# Patient Record
Sex: Female | Born: 1935 | Race: Black or African American | Hispanic: No | Smoking: Former smoker
Health system: Southern US, Community
[De-identification: ages and names within clinical notes are randomized; demographics above are authoritative.]

## PROBLEM LIST (undated history)

## (undated) DIAGNOSIS — J4489 Other specified chronic obstructive pulmonary disease: Secondary | ICD-10-CM

## (undated) DIAGNOSIS — I69959 Hemiplegia and hemiparesis following unspecified cerebrovascular disease affecting unspecified side: Secondary | ICD-10-CM

## (undated) DIAGNOSIS — R918 Other nonspecific abnormal finding of lung field: Secondary | ICD-10-CM

## (undated) DIAGNOSIS — K5904 Chronic idiopathic constipation: Secondary | ICD-10-CM

## (undated) DIAGNOSIS — F0280 Dementia in other diseases classified elsewhere without behavioral disturbance: Secondary | ICD-10-CM

## (undated) DIAGNOSIS — F411 Generalized anxiety disorder: Secondary | ICD-10-CM

## (undated) DIAGNOSIS — R350 Frequency of micturition: Secondary | ICD-10-CM

## (undated) DIAGNOSIS — F0390 Unspecified dementia without behavioral disturbance: Secondary | ICD-10-CM

## (undated) DIAGNOSIS — N182 Chronic kidney disease, stage 2 (mild): Secondary | ICD-10-CM

## (undated) DIAGNOSIS — G309 Alzheimer's disease, unspecified: Secondary | ICD-10-CM

## (undated) DIAGNOSIS — R5381 Other malaise: Secondary | ICD-10-CM

## (undated) DIAGNOSIS — F0150 Vascular dementia without behavioral disturbance: Secondary | ICD-10-CM

## (undated) DIAGNOSIS — C3490 Malignant neoplasm of unspecified part of unspecified bronchus or lung: Secondary | ICD-10-CM

## (undated) DIAGNOSIS — E46 Unspecified protein-calorie malnutrition: Secondary | ICD-10-CM

## (undated) DIAGNOSIS — R413 Other amnesia: Secondary | ICD-10-CM

## (undated) DIAGNOSIS — F05 Delirium due to known physiological condition: Secondary | ICD-10-CM

## (undated) DIAGNOSIS — M1990 Unspecified osteoarthritis, unspecified site: Secondary | ICD-10-CM

## (undated) DIAGNOSIS — J449 Chronic obstructive pulmonary disease, unspecified: Secondary | ICD-10-CM

## (undated) DIAGNOSIS — L89159 Pressure ulcer of sacral region, unspecified stage: Secondary | ICD-10-CM

## (undated) DIAGNOSIS — E785 Hyperlipidemia, unspecified: Secondary | ICD-10-CM

## (undated) DIAGNOSIS — R5383 Other fatigue: Secondary | ICD-10-CM

## (undated) DIAGNOSIS — F0392 Unspecified dementia, unspecified severity, with psychotic disturbance: Secondary | ICD-10-CM

## (undated) HISTORY — DX: Other fatigue: R53.83

## (undated) HISTORY — DX: Other amnesia: R41.3

## (undated) HISTORY — DX: Unspecified dementia, unspecified severity, without behavioral disturbance, psychotic disturbance, mood disturbance, and anxiety: F03.90

## (undated) HISTORY — DX: Unspecified dementia, unspecified severity, with psychotic disturbance: F03.92

## (undated) HISTORY — PX: CATARACT EXTRACTION, BILATERAL: SHX1313

## (undated) HISTORY — DX: Chronic obstructive pulmonary disease, unspecified: J44.9

## (undated) HISTORY — DX: Other malaise: R53.81

## (undated) HISTORY — DX: Other nonspecific abnormal finding of lung field: R91.8

## (undated) HISTORY — DX: Hyperlipidemia, unspecified: E78.5

## (undated) HISTORY — DX: Hemiplegia and hemiparesis following unspecified cerebrovascular disease affecting unspecified side: I69.959

## (undated) HISTORY — DX: Unspecified dementia, unspecified severity, without behavioral disturbance, psychotic disturbance, mood disturbance, and anxiety: F05

## (undated) HISTORY — DX: Frequency of micturition: R35.0

## (undated) HISTORY — DX: Generalized anxiety disorder: F41.1

## (undated) HISTORY — DX: Other specified chronic obstructive pulmonary disease: J44.89

---

## 1997-07-02 ENCOUNTER — Other Ambulatory Visit: Admission: RE | Admit: 1997-07-02 | Discharge: 1997-07-02 | Payer: Self-pay | Admitting: Nephrology

## 2000-06-13 ENCOUNTER — Encounter: Payer: Self-pay | Admitting: Nephrology

## 2000-06-13 ENCOUNTER — Encounter: Admission: RE | Admit: 2000-06-13 | Discharge: 2000-06-13 | Payer: Self-pay | Admitting: Nephrology

## 2002-08-06 ENCOUNTER — Other Ambulatory Visit: Admission: RE | Admit: 2002-08-06 | Discharge: 2002-08-06 | Payer: Self-pay | Admitting: Nephrology

## 2002-08-06 ENCOUNTER — Other Ambulatory Visit: Admission: RE | Admit: 2002-08-06 | Discharge: 2002-08-06 | Payer: Self-pay | Admitting: Cardiology

## 2005-08-27 ENCOUNTER — Other Ambulatory Visit: Admission: RE | Admit: 2005-08-27 | Discharge: 2005-08-27 | Payer: Self-pay | Admitting: Nephrology

## 2006-09-18 ENCOUNTER — Encounter: Admission: RE | Admit: 2006-09-18 | Discharge: 2006-09-18 | Payer: Self-pay | Admitting: Nephrology

## 2007-10-15 ENCOUNTER — Ambulatory Visit: Payer: Self-pay | Admitting: Internal Medicine

## 2007-10-20 ENCOUNTER — Encounter: Admission: RE | Admit: 2007-10-20 | Discharge: 2007-10-20 | Payer: Self-pay | Admitting: Nephrology

## 2007-10-30 ENCOUNTER — Encounter: Payer: Self-pay | Admitting: Internal Medicine

## 2007-10-30 ENCOUNTER — Ambulatory Visit: Payer: Self-pay | Admitting: Internal Medicine

## 2007-11-01 ENCOUNTER — Encounter: Payer: Self-pay | Admitting: Internal Medicine

## 2008-05-07 ENCOUNTER — Ambulatory Visit (HOSPITAL_COMMUNITY): Admission: RE | Admit: 2008-05-07 | Discharge: 2008-05-07 | Payer: Self-pay | Admitting: Ophthalmology

## 2009-07-04 ENCOUNTER — Ambulatory Visit (HOSPITAL_COMMUNITY): Admission: RE | Admit: 2009-07-04 | Discharge: 2009-07-04 | Payer: Self-pay | Admitting: Ophthalmology

## 2009-11-03 ENCOUNTER — Encounter: Admission: RE | Admit: 2009-11-03 | Discharge: 2009-11-03 | Payer: Self-pay | Admitting: Nephrology

## 2009-11-29 ENCOUNTER — Encounter: Admission: RE | Admit: 2009-11-29 | Discharge: 2009-11-29 | Payer: Self-pay | Admitting: Nephrology

## 2009-12-12 ENCOUNTER — Ambulatory Visit (HOSPITAL_COMMUNITY)
Admission: RE | Admit: 2009-12-12 | Discharge: 2009-12-12 | Payer: Self-pay | Source: Home / Self Care | Admitting: Nephrology

## 2009-12-23 ENCOUNTER — Ambulatory Visit: Payer: Self-pay | Admitting: Cardiothoracic Surgery

## 2009-12-30 ENCOUNTER — Ambulatory Visit (HOSPITAL_COMMUNITY): Admission: RE | Admit: 2009-12-30 | Discharge: 2009-12-30 | Payer: Self-pay | Admitting: Cardiothoracic Surgery

## 2009-12-30 ENCOUNTER — Ambulatory Visit: Payer: Self-pay | Admitting: Internal Medicine

## 2009-12-30 ENCOUNTER — Encounter: Payer: Self-pay | Admitting: Oncology

## 2010-01-04 ENCOUNTER — Ambulatory Visit: Payer: Self-pay | Admitting: Cardiothoracic Surgery

## 2010-01-05 ENCOUNTER — Encounter: Payer: Self-pay | Admitting: Cardiothoracic Surgery

## 2010-01-05 ENCOUNTER — Ambulatory Visit (HOSPITAL_COMMUNITY): Admission: RE | Admit: 2010-01-05 | Discharge: 2010-01-05 | Payer: Self-pay | Admitting: Cardiothoracic Surgery

## 2010-01-09 ENCOUNTER — Encounter: Payer: Self-pay | Admitting: Cardiothoracic Surgery

## 2010-01-09 ENCOUNTER — Inpatient Hospital Stay (HOSPITAL_COMMUNITY): Admission: RE | Admit: 2010-01-09 | Discharge: 2010-01-14 | Payer: Self-pay | Admitting: Cardiothoracic Surgery

## 2010-01-09 ENCOUNTER — Ambulatory Visit: Payer: Self-pay | Admitting: Cardiothoracic Surgery

## 2010-01-20 ENCOUNTER — Ambulatory Visit: Payer: Self-pay | Admitting: Cardiothoracic Surgery

## 2010-01-30 ENCOUNTER — Encounter: Admission: RE | Admit: 2010-01-30 | Discharge: 2010-01-30 | Payer: Self-pay | Admitting: Cardiothoracic Surgery

## 2010-01-30 ENCOUNTER — Ambulatory Visit: Payer: Self-pay | Admitting: Cardiothoracic Surgery

## 2010-03-01 ENCOUNTER — Ambulatory Visit: Payer: Self-pay | Admitting: Cardiothoracic Surgery

## 2010-03-01 ENCOUNTER — Encounter: Admission: RE | Admit: 2010-03-01 | Discharge: 2010-03-01 | Payer: Self-pay | Admitting: Cardiothoracic Surgery

## 2010-05-10 ENCOUNTER — Encounter
Admission: RE | Admit: 2010-05-10 | Discharge: 2010-05-10 | Payer: Self-pay | Source: Home / Self Care | Attending: Cardiothoracic Surgery | Admitting: Cardiothoracic Surgery

## 2010-05-10 ENCOUNTER — Ambulatory Visit
Admission: RE | Admit: 2010-05-10 | Discharge: 2010-05-10 | Payer: Self-pay | Source: Home / Self Care | Attending: Cardiothoracic Surgery | Admitting: Cardiothoracic Surgery

## 2010-05-10 NOTE — Assessment & Plan Note (Signed)
OFFICE VISIT  Stacey Fernandez, Stacey Fernandez DOB:  05/02/1928                                        May 10, 2010 CHART #:  [id number]  CURRENT PROBLEMS: 1. 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 2. Chronic obstructive pulmonary disease with past history of smoking.  PRESENT ILLNESS:  The patient returns for her 63-month followup after undergoing right VATS and wedge resection of a non-small cell carcinoma. Due to her advanced age, a lobular resection was successfully completed. She had mediastinal lymph node dissection which was negative.  She has not resumed smoking and she believes she has gained 1-2 pounds in weight.  She denies shortness of breath or incisional pain.  Her medications continued to be Lipitor, benazepril, HCl Ultram, and Ensure.  PHYSICAL EXAMINATION:  On exam blood pressure 150/90, pulse 70, respirations 18, saturation 98%.  She appears very well and comfortable. Breath sounds are clear and equal.  The thoracotomy incision is well- healed.  Cardiac rhythm is regular.  There is no peripheral edema.  She has good range of motion of the right upper extremity.  LABORATORY DATA:  PA and lateral chest x-ray reveals a staple line in the right upper lung field with some postoperative changes but no evidence of recurrence.  There is no pleural effusion.  IMPRESSION AND PLAN:  The patient has done well 6 months after right video-assisted thoracoscopic surgery with a wedge resection of a non- small cell carcinoma stage I.  I will plan on seeing her back in 1 year following surgery with a CT scan of the chest.  She was strongly advised not to resume smoking.  Kerin Perna, M.D. Electronically Signed  PV/MEDQ  D:  05/10/2010  T:  05/10/2010  Job:  [id number]

## 2010-05-17 ENCOUNTER — Encounter: Payer: Self-pay | Admitting: Cardiothoracic Surgery

## 2010-06-29 LAB — BLOOD GAS, ARTERIAL
Acid-Base Excess: 1.1 mmol/L (ref 0.0–2.0)
Acid-base deficit: 1.8 mmol/L (ref 0.0–2.0)
Bicarbonate: 24.2 mEq/L — ABNORMAL HIGH (ref 20.0–24.0)
Bicarbonate: 25.4 mEq/L — ABNORMAL HIGH (ref 20.0–24.0)
Drawn by: 206361
Drawn by: 32470
FIO2: 0.21 %
FIO2: 0.3 %
O2 Saturation: 97.8 %
O2 Saturation: 99.1 %
Patient temperature: 98.6
Patient temperature: 98.6
TCO2: 25.8 mmol/L (ref 0–100)
TCO2: 26.7 mmol/L (ref 0–100)
pCO2 arterial: 42.1 mmHg (ref 35.0–45.0)
pCO2 arterial: 54.1 mmHg — ABNORMAL HIGH (ref 35.0–45.0)
pH, Arterial: 7.272 — ABNORMAL LOW (ref 7.350–7.400)
pH, Arterial: 7.398 (ref 7.350–7.400)
pO2, Arterial: 156 mmHg — ABNORMAL HIGH (ref 80.0–100.0)
pO2, Arterial: 97.7 mmHg (ref 80.0–100.0)

## 2010-06-29 LAB — DIFFERENTIAL
Basophils Absolute: 0 10*3/uL (ref 0.0–0.1)
Basophils Relative: 0 % (ref 0–1)
Eosinophils Absolute: 0 10*3/uL (ref 0.0–0.7)
Eosinophils Relative: 0 % (ref 0–5)
Lymphocytes Relative: 25 % (ref 12–46)
Lymphs Abs: 2.3 10*3/uL (ref 0.7–4.0)
Monocytes Absolute: 1.2 10*3/uL — ABNORMAL HIGH (ref 0.1–1.0)
Monocytes Relative: 14 % — ABNORMAL HIGH (ref 3–12)
Neutro Abs: 5.5 10*3/uL (ref 1.7–7.7)
Neutrophils Relative %: 61 % (ref 43–77)

## 2010-06-29 LAB — TYPE AND SCREEN
ABO/RH(D): B POS
Antibody Screen: NEGATIVE

## 2010-06-29 LAB — GLUCOSE, CAPILLARY
Glucose-Capillary: 101 mg/dL — ABNORMAL HIGH (ref 70–99)
Glucose-Capillary: 103 mg/dL — ABNORMAL HIGH (ref 70–99)
Glucose-Capillary: 108 mg/dL — ABNORMAL HIGH (ref 70–99)
Glucose-Capillary: 109 mg/dL — ABNORMAL HIGH (ref 70–99)
Glucose-Capillary: 110 mg/dL — ABNORMAL HIGH (ref 70–99)
Glucose-Capillary: 114 mg/dL — ABNORMAL HIGH (ref 70–99)
Glucose-Capillary: 115 mg/dL — ABNORMAL HIGH (ref 70–99)
Glucose-Capillary: 117 mg/dL — ABNORMAL HIGH (ref 70–99)
Glucose-Capillary: 118 mg/dL — ABNORMAL HIGH (ref 70–99)
Glucose-Capillary: 137 mg/dL — ABNORMAL HIGH (ref 70–99)
Glucose-Capillary: 147 mg/dL — ABNORMAL HIGH (ref 70–99)
Glucose-Capillary: 91 mg/dL (ref 70–99)
Glucose-Capillary: 91 mg/dL (ref 70–99)
Glucose-Capillary: 93 mg/dL (ref 70–99)

## 2010-06-29 LAB — COMPREHENSIVE METABOLIC PANEL
ALT: 30 U/L (ref 0–35)
ALT: 36 U/L — ABNORMAL HIGH (ref 0–35)
AST: 29 U/L (ref 0–37)
AST: 31 U/L (ref 0–37)
Albumin: 2.9 g/dL — ABNORMAL LOW (ref 3.5–5.2)
Albumin: 3.6 g/dL (ref 3.5–5.2)
Alkaline Phosphatase: 51 U/L (ref 39–117)
Alkaline Phosphatase: 58 U/L (ref 39–117)
BUN: 13 mg/dL (ref 6–23)
BUN: 5 mg/dL — ABNORMAL LOW (ref 6–23)
CO2: 24 mEq/L (ref 19–32)
CO2: 24 mEq/L (ref 19–32)
Calcium: 8.4 mg/dL (ref 8.4–10.5)
Calcium: 9.1 mg/dL (ref 8.4–10.5)
Chloride: 107 mEq/L (ref 96–112)
Chloride: 113 mEq/L — ABNORMAL HIGH (ref 96–112)
Creatinine, Ser: 0.65 mg/dL (ref 0.4–1.2)
Creatinine, Ser: 0.86 mg/dL (ref 0.4–1.2)
GFR calc Af Amer: >60 mL/min (ref >60)
GFR calc Af Amer: >60 mL/min (ref >60)
GFR calc non Af Amer: >60 mL/min (ref >60)
GFR calc non Af Amer: >60 mL/min (ref >60)
Glucose, Bld: 105 mg/dL — ABNORMAL HIGH (ref 70–99)
Glucose, Bld: 87 mg/dL (ref 70–99)
Potassium: 3.3 mEq/L — ABNORMAL LOW (ref 3.5–5.1)
Potassium: 4.1 mEq/L (ref 3.5–5.1)
Sodium: 135 mEq/L (ref 135–145)
Sodium: 143 mEq/L (ref 135–145)
Total Bilirubin: 0.5 mg/dL (ref 0.3–1.2)
Total Bilirubin: 0.8 mg/dL (ref 0.3–1.2)
Total Protein: 5.7 g/dL — ABNORMAL LOW (ref 6.0–8.3)
Total Protein: 5.8 g/dL — ABNORMAL LOW (ref 6.0–8.3)

## 2010-06-29 LAB — URINALYSIS, ROUTINE W REFLEX MICROSCOPIC
Bilirubin Urine: NEGATIVE
Bilirubin Urine: NEGATIVE
Glucose, UA: NEGATIVE mg/dL
Glucose, UA: NEGATIVE mg/dL
Hgb urine dipstick: NEGATIVE
Ketones, ur: NEGATIVE mg/dL
Ketones, ur: NEGATIVE mg/dL
Leukocytes, UA: NEGATIVE
Nitrite: NEGATIVE
Nitrite: NEGATIVE
Protein, ur: 30 mg/dL — AB
Protein, ur: NEGATIVE mg/dL
Specific Gravity, Urine: 1.013 (ref 1.005–1.030)
Specific Gravity, Urine: 1.02 (ref 1.005–1.030)
Urobilinogen, UA: 0.2 mg/dL (ref 0.0–1.0)
Urobilinogen, UA: 1 mg/dL (ref 0.0–1.0)
pH: 5.5 (ref 5.0–8.0)
pH: 6 (ref 5.0–8.0)

## 2010-06-29 LAB — CBC
HCT: 33.8 % — ABNORMAL LOW (ref 36.0–46.0)
HCT: 34.5 % — ABNORMAL LOW (ref 36.0–46.0)
HCT: 38.3 % (ref 36.0–46.0)
HCT: 43.5 % (ref 36.0–46.0)
Hemoglobin: 11 g/dL — ABNORMAL LOW (ref 12.0–15.0)
Hemoglobin: 11.3 g/dL — ABNORMAL LOW (ref 12.0–15.0)
Hemoglobin: 12.7 g/dL (ref 12.0–15.0)
Hemoglobin: 14.3 g/dL (ref 12.0–15.0)
MCH: 29.3 pg (ref 26.0–34.0)
MCH: 30 pg (ref 26.0–34.0)
MCH: 30.1 pg (ref 26.0–34.0)
MCH: 30.1 pg (ref 26.0–34.0)
MCHC: 32.5 g/dL (ref 30.0–36.0)
MCHC: 32.8 g/dL (ref 30.0–36.0)
MCHC: 32.9 g/dL (ref 30.0–36.0)
MCHC: 33.2 g/dL (ref 30.0–36.0)
MCV: 90.1 fL (ref 78.0–100.0)
MCV: 90.5 fL (ref 78.0–100.0)
MCV: 91.6 fL (ref 78.0–100.0)
MCV: 91.8 fL (ref 78.0–100.0)
Platelets: 117 10*3/uL — ABNORMAL LOW (ref 150–400)
Platelets: 129 10*3/uL — ABNORMAL LOW (ref 150–400)
Platelets: 158 10*3/uL (ref 150–400)
Platelets: 205 10*3/uL (ref 150–400)
RBC: 3.75 MIL/uL — ABNORMAL LOW (ref 3.87–5.11)
RBC: 3.76 MIL/uL — ABNORMAL LOW (ref 3.87–5.11)
RBC: 4.23 MIL/uL (ref 3.87–5.11)
RBC: 4.75 MIL/uL (ref 3.87–5.11)
RDW: 13 % (ref 11.5–15.5)
RDW: 13.5 % (ref 11.5–15.5)
RDW: 13.6 % (ref 11.5–15.5)
RDW: 13.7 % (ref 11.5–15.5)
WBC: 12.3 10*3/uL — ABNORMAL HIGH (ref 4.0–10.5)
WBC: 13.1 10*3/uL — ABNORMAL HIGH (ref 4.0–10.5)
WBC: 6.1 10*3/uL (ref 4.0–10.5)
WBC: 9.1 10*3/uL (ref 4.0–10.5)

## 2010-06-29 LAB — URINE CULTURE
Colony Count: >=100000
Culture  Setup Time: 201109301837

## 2010-06-29 LAB — PROTIME-INR
INR: 1 (ref 0.00–1.49)
Prothrombin Time: 13.4 seconds (ref 11.6–15.2)

## 2010-06-29 LAB — POCT I-STAT 3, ART BLOOD GAS (G3+)
Bicarbonate: 24.1 mEq/L — ABNORMAL HIGH (ref 20.0–24.0)
Bicarbonate: 26.1 mEq/L — ABNORMAL HIGH (ref 20.0–24.0)
O2 Saturation: 94 %
O2 Saturation: 99 %
Patient temperature: 97.5
Patient temperature: 98.1
TCO2: 25 mmol/L (ref 0–100)
TCO2: 28 mmol/L (ref 0–100)
pCO2 arterial: 36.2 mmHg (ref 35.0–45.0)
pCO2 arterial: 48 mmHg — ABNORMAL HIGH (ref 35.0–45.0)
pH, Arterial: 7.34 — ABNORMAL LOW (ref 7.350–7.400)
pH, Arterial: 7.43 — ABNORMAL HIGH (ref 7.350–7.400)
pO2, Arterial: 159 mmHg — ABNORMAL HIGH (ref 80.0–100.0)
pO2, Arterial: 68 mmHg — ABNORMAL LOW (ref 80.0–100.0)

## 2010-06-29 LAB — URINE MICROSCOPIC-ADD ON

## 2010-06-29 LAB — BASIC METABOLIC PANEL
BUN: 6 mg/dL (ref 6–23)
CO2: 24 mEq/L (ref 19–32)
Calcium: 8.4 mg/dL (ref 8.4–10.5)
Chloride: 107 mEq/L (ref 96–112)
Creatinine, Ser: 0.74 mg/dL (ref 0.4–1.2)
GFR calc Af Amer: >60 mL/min (ref >60)
GFR calc non Af Amer: >60 mL/min (ref >60)
Glucose, Bld: 121 mg/dL — ABNORMAL HIGH (ref 70–99)
Potassium: 3.6 mEq/L (ref 3.5–5.1)
Sodium: 140 mEq/L (ref 135–145)

## 2010-06-29 LAB — CLOSTRIDIUM DIFFICILE EIA: C difficile Toxins A+B, EIA: NEGATIVE

## 2010-06-29 LAB — APTT: aPTT: 30 seconds (ref 24–37)

## 2010-06-29 LAB — SURGICAL PCR SCREEN
MRSA, PCR: NEGATIVE
Staphylococcus aureus: NEGATIVE

## 2010-06-29 LAB — ABO/RH: ABO/RH(D): B POS

## 2010-06-30 LAB — GLUCOSE, CAPILLARY: Glucose-Capillary: 97 mg/dL (ref 70–99)

## 2010-07-28 ENCOUNTER — Encounter (HOSPITAL_COMMUNITY)
Admission: RE | Admit: 2010-07-28 | Discharge: 2010-07-28 | Disposition: A | Discharge status: Home / Self Care | Payer: Medicare Other | Source: Ambulatory Visit | Attending: Ophthalmology | Admitting: Ophthalmology

## 2010-07-28 LAB — CBC
Hemoglobin: 14.4 g/dL (ref 12.0–15.0)
MCH: 30.1 pg (ref 26.0–34.0)
MCHC: 33.4 g/dL (ref 30.0–36.0)
Platelets: 191 10*3/uL (ref 150–400)
RDW: 13.7 % (ref 11.5–15.5)

## 2010-07-31 LAB — BASIC METABOLIC PANEL
CO2: 26 mEq/L (ref 19–32)
Calcium: 9.2 mg/dL (ref 8.4–10.5)
Creatinine, Ser: 0.78 mg/dL (ref 0.4–1.2)
GFR calc Af Amer: >60 mL/min (ref >60)
GFR calc non Af Amer: >60 mL/min (ref >60)
Sodium: 142 mEq/L (ref 135–145)

## 2010-07-31 LAB — URINALYSIS, ROUTINE W REFLEX MICROSCOPIC
Glucose, UA: NEGATIVE mg/dL
Protein, ur: NEGATIVE mg/dL
Specific Gravity, Urine: 1.013 (ref 1.005–1.030)
Urobilinogen, UA: 0.2 mg/dL (ref 0.0–1.0)

## 2010-07-31 LAB — CBC
Hemoglobin: 14 g/dL (ref 12.0–15.0)
MCHC: 32.9 g/dL (ref 30.0–36.0)
RBC: 4.71 MIL/uL (ref 3.87–5.11)

## 2010-08-02 ENCOUNTER — Ambulatory Visit (HOSPITAL_COMMUNITY)
Admission: RE | Admit: 2010-08-02 | Discharge: 2010-08-02 | Disposition: A | Discharge status: Home / Self Care | Payer: Medicare Other | Source: Ambulatory Visit | Attending: Ophthalmology | Admitting: Ophthalmology

## 2010-08-02 DIAGNOSIS — Z01812 Encounter for preprocedural laboratory examination: Secondary | ICD-10-CM | POA: Insufficient documentation

## 2010-08-02 DIAGNOSIS — IMO0002 Reserved for concepts with insufficient information to code with codable children: Secondary | ICD-10-CM | POA: Insufficient documentation

## 2010-08-02 DIAGNOSIS — H269 Unspecified cataract: Secondary | ICD-10-CM | POA: Insufficient documentation

## 2010-08-02 DIAGNOSIS — Y9210 Unspecified residential institution as the place of occurrence of the external cause: Secondary | ICD-10-CM | POA: Insufficient documentation

## 2010-08-02 DIAGNOSIS — Z79899 Other long term (current) drug therapy: Secondary | ICD-10-CM | POA: Insufficient documentation

## 2010-08-02 LAB — BASIC METABOLIC PANEL
Calcium: 9.1 mg/dL (ref 8.4–10.5)
Chloride: 107 mEq/L (ref 96–112)
Creatinine, Ser: 0.84 mg/dL (ref 0.4–1.2)
GFR calc Af Amer: >60 mL/min (ref >60)
Sodium: 142 mEq/L (ref 135–145)

## 2010-08-10 NOTE — Op Note (Signed)
Stacey Fernandez, ROTTMAN            ACCOUNT NO.:  [id number]  MEDICAL RECORD NO.:  [id number]          PATIENT TYPE:  LOCATION:                                 FACILITY:  PHYSICIAN:  Shade Flood, MD       DATE OF BIRTH:  11/06/1929  DATE OF PROCEDURE: DATE OF DISCHARGE:                              OPERATIVE REPORT   PREOPERATIVE DIAGNOSIS:  Cataract, right eye.  POSTOPERATIVE DIAGNOSIS:  Cataract, right eye.  COMPLICATIONS:  Posterior capsule tear with dislocation of cataract fragment into the vitreous.  SECONDARY PROCEDURE PERFORMED:  Pars plana vitrectomy with removal of cataract fragments.  There were no specimens for pathology and blood loss was less than 1 mL.  The patient was prepared and draped in the usual fashion for ocular surgery on the right eye and a solid lid speculum was placed.  Calipers were used to show 3.5 mm at the surgical limbus centered at the 11 o'clock meridian, and a Grieshaber 01 blade was used to make a peripheral corneal groove, and a keratome was used to enter the anterior chamber with a shelved self-sealing incision.  A separate stab incision was made with a 15-degree blade at the 2:30 meridian.    Provisc was instilled into the anterior chamber, and a bent 25-gauge needle was used to perform a capsulorrhexis.  After completion of capsulorrhexis, the Chang chopper and phaco handpiece were inserted, and a combined phaco chop technique was employed fracturing the lens into four sections.  The phacoemulsification handpiece was used to remove the nuclear fragments.  During removal of the last nuclear fragment, a posterior capsule tear developed, which enlarged fairly quickly, and cataract fragments fell into the posterior chamber. The anterior capsule was intact and provided substantial support, so Provisc was placed in the anterior chamber again, and a Monarch MA50BM posterior chamber implant was inserted into the sulcus in front of  the anterior capsule behind the iris.  The trailing haptic was dialed in behind the iris with a Sinskey lens hook.  The IA cannula was used to remove the viscoelastic from the anterior chamber, and the wound was closed with interrupted 10-0 nylon sutures.    We converted to a 23-gauge vitrectomy. A trocar cannula was placed at the inferior temporal aspect at 7:30, and the infusion line was inspected and attached to the cannula, and the tip of the cannula was visualized in the vitreous cavity.  Trocar cannulas were placed at 9:30 and 2:30.  The light pipe and vitreous cutter were inserted and core vitrectomy was carried out.  There were small lens fragments resting on the macula.  Those were removed uneventfully with the vitreous cutter, and care was taken to ensure there were no residual fragments behind the implant near the vitreous base.    The cannulas at 9:30 and 2:30 were then removed with concomitant closure with the infusion pressure turned down to 10 mm, and then the infusion cannula was removed with concomitant closure using a cotton tip applicator.  Thepatient was given 4 mg of Decadron subconjunctivally and 100 mg of Ancef  subconjunctivally using an olive tipped cannula through a conjunctival  snip incision. The lid speculum was removed, and the patient's eye was patched using polymyxin bacitracin ointment.  A plastic shield was placed, and she was transferred alert and conversant from the operating room to the postoperative recovery area.          ______________________________ Shade Flood, MD     GG/MEDQ  D:  08/02/2010  T:  08/03/2010  Job:  [id number]  Electronically Signed by Shade Flood MD on 08/10/2010 09:42:10 AM

## 2010-08-29 NOTE — Consult Note (Signed)
NEW PATIENT CONSULTATION   [place], Ahjanae H  DOB:  05/02/1928                                        January 04, 2010  CHART #:  [id number]   ADMISSION DIAGNOSES:  1. A 1.7-cm right upper lobe mass with hypermetabolic activity on PET      scan  2. History of smoking.   PRESENT ILLNESS:  The patient is an 77 year old female who returns for  evaluation and discussion of recently diagnosed right upper lobe mass  with hypermetabolic activity on PET scan consistent with a primary  bronchogenic carcinoma.  The lesion clinically is a stage I bronchogenic  carcinoma without mediastinal adenopathy.  Since her initial  presentation, she has had a brain MRI which is negative for metastatic  disease.  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  This  was asymptomatic.  She has undergone a 2-D echo which shows normal LV  function without valvular disease or pericardial effusion.  She  underwent pulmonary function test which show good mechanics with FEV-1  of 1.5, FVC of 2.2, and a diffusion capacity 72% of predicted.  She  continues to have no symptoms from the lung lesion and has completely  stopped smoking.   PAST MEDICAL HISTORY:  1. Dyslipidemia.  2. Mild dimension  3. No known drug allergies.   MEDICATIONS:  1. Lipitor 10 mg a day.  2. Donepezil 10 mg daily.   REVIEW OF SYSTEMS:  CONSTITUTIONAL:  Negative for weight loss.  ENT:  Negative for change in vision or headache.  Negative for dental  difficulties or difficulty swallowing..  THORACIC:  Negative for history of chest trauma, symptoms of URI,  positive for the right upper lobe lung nodule, and history of smoking.  CARDIAC:  Negative for angina, murmur.  Normal LV function by echo.  GI:  Negative for hepatitis, jaundice, or blood per rectum.  ENDOCRINE:  Negative diabetes or thyroid disease.  The CT scan did show  a 9-mm nodule in left thyroid gland which was not active on PET scan.  VASCULAR:  Negative for DVT, claudication, or TIA.  NEUROLOGIC:  Positive or her remote left frontal CVA with carotid duplex  scans pending before surgery.   PHYSICAL EXAMINATION:  She is 5 feet 1 inch and weighs 99 pounds.  Blood  pressure 136/70, pulse 63, respirations 18, saturation 99%.  General  appearance is of a very pleasant early [place] American female in no  acute stress.  HEENT exam is normocephalic.  Pupils are equal.  Neck is  without JVD, mass, or bruit.  Lymphatics show no palpable adenopathy in  the neck or supraclavicular fossa.  Cardiac exam is regular rhythm  without S3 gallop or murmur.  Lungs are clear, and there is no thoracic  deformity.  Abdominal exam is soft, nontender without pulsatile mass.  Extremities reveal mild clubbing but no cyanosis, edema, tenderness.  Vascular exam is 2+ pulses in all extremities.  Neurologic exam is  intact.  The patient is able to walk up and down the hallway in the  office without difficulty.   LABORATORY DATA:  I reviewed the results of her brain MRI, PFTs, 2-D  echo, as well as her previous PET-CT scan.   ASSESSMENT AND PLAN:  She understands she has a very probable primary  small stage  I clinical bronchogenic carcinoma of the right upper lobe.  The best recommendation for therapy would be surgical resection.  This  will be scheduled for Monday, January 09, 2010.  I discussed the  details of surgery including the location of the surgical incision and  the expected postoperative recovery as well as the associated potential  risks of bleeding, pneumonia, prolonged air leak, infection, and death.  She understands and agrees to proceed with surgery.   Kerin Perna, M.D.  Electronically Signed   PV/MEDQ  D:  01/04/2010  T:  01/05/2010  Job:  [id number]   cc:   Jarome Matin, M.D.

## 2010-08-29 NOTE — Assessment & Plan Note (Signed)
OFFICE VISIT   Stacey Fernandez, Stacey Fernandez  DOB:  05/02/1928                                        January 30, 2010  CHART #:  [id number]   HISTORY:  The patient is a 77 year old black female who is recently  hospitalized and underwent right video-assisted thoracoscopy with wedge  resection of the right upper lobe lung nodule.  Additionally,  mediastinal lymph node dissection.  Findings from the surgery have  revealed a 1.7-cm adenocarcinoma.  Lymph node biopsies were negative for  tumor.  Today, she was seen in the office in routine postoperative  followup.  Currently, she reports that she is feeling quite well.  She  denies fevers, chills or other constitutional symptoms.  She denies  shortness of breath.  She denies cough or sputum production.  She  reports that she is increasing her activities and gradually progressing  nicely in this regard.  She denies any significant pain at this point.   Chest x-ray was obtained on today's date.  It reveals clear lungs  bilaterally.  There is some slight opacification in the region of the  staples where the wedge resection was done.  There is no pleural  effusions.  There is no evidence of congestive failure or other  infiltrates.   PHYSICAL EXAMINATION:  General:  The patient is alert, elderly black  female in no acute distress.  Pulmonary:  Clear lungs to auscultation.  Cardiac:  Normal S1 and S2, no murmurs, gallops or rubs.  Regular rate  and rhythm.  Extremities:  No edema.  Incision is inspected healing well  without evidence of infection.   ASSESSMENT:  The patient is making excellent ongoing recovery following  her right video-assisted thoracoscopy and wedge resection as described.  This is a T1 lesion and does not appear  to require any adjuvant treatment.  We will see her again in the office  in 1 month with a chest x-ray.   Rowe Clack, P.A.-C.   Sherryll Burger  D:  01/30/2010  T:  01/31/2010  Job:  [id number]   cc:   Kerin Perna, M.D.  Jarome Matin, M.D.

## 2010-08-29 NOTE — Assessment & Plan Note (Signed)
OFFICE VISIT   Stacey Fernandez, Stacey Fernandez  DOB:  05/02/1928                                        March 01, 2010  CHART #:  [id number]   CURRENT PROBLEMS:  1. Status post right video-assisted thoracoscopic surgery and wedge      resection of a 1.7-cm non-small cell carcinoma of the right upper      lobe on January 09, 2010 (stage IA).  2. Chronic obstructive pulmonary disease with history of smoking, now      reformed.  3. Dyslipidemia.   CURRENT MEDICATIONS:  1. Lipitor 10 mg daily.  2. Benazepril 10 mg daily.  3. Ultram p.r.n. pain.  4. Ensure 1 can daily.   PRESENT ILLNESS:  The patient is a very nice 77 year old female ex-  smoker, who underwent a right VATS and wedge resection of a small non-  small cell carcinoma (adenocarcinoma) of the right upper lobe 6 weeks  ago.  She has done very well.  She has had no significant incisional  pain or shortness of breath.  She is ready to resume driving and normal  light level of daily activities.  She has continued to be smoking free.   PHYSICAL EXAMINATION:  Vital Signs:  Blood pressure 130/80, pulse 80 and  regular, respirations 18, and saturation 99% on room air.  General:  She  is alert and pleasant.  She appears to be thinner, however, and on her  weight today of 88 pounds.  She is approximately 10 pounds down from her  preop weight.  Lungs:  Her incision is well healed and breath sounds are  clear bilaterally.  Cardiac:  Rhythm is regular.  Extremities:  There is  no peripheral edema.   DIAGNOSTIC TESTS:  A PA and lateral chest x-ray shows clear lung fields  except for some postoperative changes in the right upper lung field.  No  pleural effusion and no pneumothorax.   IMPRESSION AND PLAN:  The patient will return in approximately 2 months  for a 20-month followup with x-ray.  No further treatment is planned for  this stage IA non-small cell carcinoma.  She was encouraged to resume  Ensure and to  try to avoid losing any further weight.   Kerin Perna, M.D.  Electronically Signed   PV/MEDQ  D:  03/01/2010  T:  03/01/2010  Job:  [id number]   cc:   Jarome Matin, M.D.

## 2010-08-29 NOTE — Consult Note (Signed)
NEW PATIENT CONSULTATION   MCCONAHY, Braya H  DOB:  05/02/1928                                        December 23, 2009  CHART #:  [id number]   REASON FOR CONSULTATION:  1.7 cm right upper lobe mass with  hypermetabolic activity on PET scan.   CHIEF COMPLAINT:  Abnormal chest x-ray.   HISTORY OF PRESENT ILLNESS:  I was asked to evaluate this very nice 77-  year-old female for treatment of a recently diagnosed right upper lobe  nodule with hypermetabolic activity on PET scan.  The patient has smoked  1/2 to 1 pack of cigarettes a day for over 30 years.  Her last chest x-  ray was in 2009, which was clear.  Her routine screening x-ray earlier  this year showed a density in right upper lobe and this was confirmed by  a CT scan.  There are no other nodules and there is no mediastinal  adenopathy.  A PET scan was performed, which demonstrated a  hypermetabolic activity with an SUV maximum of 1.5 units.  There is no  abnormal metabolic activity in the mediastinum, in the neck or abdomen,  or in other areas of the lung.  A nodule in the left lobe of the thyroid  gland was present on the CT scan, but did not show hypermetabolic  activity on the PET scan.  Because of her abnormal scans, she presents  for thoracic surgical evaluation and treatment.  The patient stopped  smoking approximately 1 week ago.  She denies night sweats, weight loss,  chest pain, shortness of breath, productive cough, or hemoptysis.  There  is no family history of lung cancer.  She denies any previous thoracic  surgical procedures or thoracic trauma.   MEDICATIONS:  Her only medications include Lipitor 10 mg a day and  donepezil 10 mg a day for memory loss.   ALLERGIES:  She denies allergies to medications.   REVIEW OF SYSTEMS:  CONSTITUTIONAL:  Negative for fever or weight loss.  ENT:  Negative for headache or change in vision.  She has had some mild  memory difficulty in the past 1-2  years.  She denies any dental  difficulties or complaints or difficulty swallowing.  THORACIC:  Negative as outlined above except for the nodule in the right  upper lobe and history of smoking.  CARDIAC:  Negative for angina, murmur, arrhythmia, or CHF symptoms.  GI:  Negative for blood per rectum, jaundice, or abdominal pain.  ENDOCRINE:  Negative for diabetes, negative for thyroid disease.  She  does have a 9-mm nodule in the left thyroid gland, which is not active  on PET scan.  VASCULAR:  Negative for DVT, claudication, or TIA.  NEUROLOGIC:  Negative for stroke or seizure.   PHYSICAL EXAMINATION:  Vital Signs:  The patient is 5 feet 1 inch,  weighs 99 pounds.  Blood pressure 120/70, pulse 66, respirations 18, and  saturation on room air 98%.  General:  She is alert and very pleasant.  HEENT:  Normocephalic.  Pupils are equal.  Neck:  Without JVD, mass, or  bruit.  There is no palpable adenopathy in the neck or supraclavicular  fossa.  Lungs:  Breath sounds are clear and equal.  Cardiac:  Rhythm is  regular without S3 gallop, murmur, or rub.  Abdomen:  Soft and nontender  without pulsatile mass.  Extremities:  Mild clubbing, but no cyanosis,  edema, or tenderness.  Peripheral pulses are 2+ in all extremities.  Neurologic:  Intact.  She is able to ambulate up and down the hall and  in the office without difficulty.   LABORATORY DATA:  I reviewed her chest x-ray, chest CT scan, and PET  scan.  She has a 1.7 cm nodular density in the periphery of the  posterior segment of the right upper lobe with hypermetabolic activity  on PET scan.  There is no associated abnormal mediastinal nodal activity  or other distant sites of hypermetabolic activity.  Clinically, this is  a stage IA probable bronchogenic carcinoma.   RECOMMENDATIONS AND PLAN:  It was recommended the patient that rather  than trying to biopsy this lesion or to follow it with serial scans, we  should proceed with excisional  biopsy.  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  Prior to surgery, we will need to obtain a brain  MRI, PFTs, and a 2-D echocardiogram.  These will be set up for the next  week and I will see the patient back in approximately 1 week to schedule  her surgery.   Kerin Perna, M.D.  Electronically Signed   PV/MEDQ  D:  12/23/2009  T:  12/24/2009  Job:  [id number]   cc:   Jarome Matin, M.D.

## 2010-08-29 NOTE — Op Note (Signed)
NAMEJANAIAH, Stacey Fernandez            ACCOUNT NO.:  [id number]   MEDICAL RECORD NO.:  [id number]          PATIENT TYPE:  AMB   LOCATION:  SDS                          FACILITY:  [facility]   PHYSICIAN:  Salley Scarlet., M.D.DATE OF BIRTH:  06-27-1929   DATE OF PROCEDURE:  DATE OF DISCHARGE:  05/07/2008                               OPERATIVE REPORT   PREOPERATIVE DIAGNOSIS:  Immature cataract, left eye.   POSTOPERATIVE DIAGNOSIS:  Immature cataract, left eye.   OPERATION:  Kelman phacoemulsification of cataract, left eye.   ANESTHESIA:  Local using Xylocaine 2%, Marcaine 0.75% with Wydase.   JUSTIFICATION FOR PROCEDURE:  This is a 77 year old lady who complains  of blurring of vision with difficulty seeing to read and write.  She was  evaluated and found to have a visual acuity, best corrected to 20/50 on  the right, 20/60 on the left.  There were bilateral immature cataracts  slightly worse on the left than the right.  Cataract extraction with  intraocular lens implantation was recommended.  She is admitted at this  time for that purpose.   PROCEDURE:  Under influence of IV sedation, a Van Lint akinesia and  retrobulbar anesthesia was given.  The patient was prepped and draped in  the usual manner.  The lid speculum was inserted under upper and lower  lid of the left eye and a 4-0 silk traction suture was passed through  the belly of the superior rectus muscle for traction.  A conjunctival  flap was turned.  Hemostasis achieved using cautery.  An incision was  made in the sclera at the limbus.  This incision was dissected down to  clear cornea using crescent blade.  A sideport incision was made at 1:35  position.  OcuCoat was injected into the eye through the sideport  incision.  The anterior chamber was entered through the corneoscleral  tunnel incision at the 11:30 o'clock position and an anterior  capsulotomy was made using a bent 25-gauge needle.  The nucleus was  hydrodissected using Xylocaine.  The KPE handpiece was passed into the  eye and the nucleus was emulsified without difficulty.  The residual  cortical material was aspirated.  The posterior capsule was polished  using an olive-tip polisher.  The wound was widened slightly to  accommodate a foldable silicone lens.  The lens was seated into the eye  behind iris without difficulty.  The anterior chamber was reformed and  the pupils constricted using Miochol.  The lips of the wound were  hydrated and tested to make sure that there was no leak.  After  ascertaining, there was no leak, the conjunctiva was closed over the  wound using thermal cautery.  Celestone 1 mL and 0.5 mL of gentamicin  were injected subconjunctivally.  Maxitrol ophthalmic ointment and  prilocaine ointment were applied along with a patch and Fox shield.  The  patient tolerated the procedure well  and was discharged to the post anesthesia recovery in satisfactory  condition.  She was instructed to rest today, to take Tylenol every 4  hours as needed for pain, and to see me  in office tomorrow for further  evaluation.   DISCHARGED DIAGNOSIS:  Immature cataract, left eye.      Salley Scarlet., M.D.  Electronically Signed     TB/MEDQ  D:  05/07/2008  T:  05/08/2008  Job:  [id number]

## 2010-10-31 ENCOUNTER — Emergency Department (HOSPITAL_COMMUNITY)
Admission: EM | Admit: 2010-10-31 | Discharge: 2010-10-31 | Disposition: A | Discharge status: Home / Self Care | Payer: Medicare Other | Attending: Emergency Medicine | Admitting: Emergency Medicine

## 2010-10-31 ENCOUNTER — Emergency Department (HOSPITAL_COMMUNITY): Payer: Medicare Other

## 2010-10-31 DIAGNOSIS — R5383 Other fatigue: Secondary | ICD-10-CM | POA: Insufficient documentation

## 2010-10-31 DIAGNOSIS — E860 Dehydration: Secondary | ICD-10-CM | POA: Insufficient documentation

## 2010-10-31 DIAGNOSIS — E876 Hypokalemia: Secondary | ICD-10-CM | POA: Insufficient documentation

## 2010-10-31 DIAGNOSIS — R0602 Shortness of breath: Secondary | ICD-10-CM | POA: Insufficient documentation

## 2010-10-31 DIAGNOSIS — R197 Diarrhea, unspecified: Secondary | ICD-10-CM | POA: Insufficient documentation

## 2010-10-31 DIAGNOSIS — F0390 Unspecified dementia without behavioral disturbance: Secondary | ICD-10-CM | POA: Insufficient documentation

## 2010-10-31 DIAGNOSIS — Z85118 Personal history of other malignant neoplasm of bronchus and lung: Secondary | ICD-10-CM | POA: Insufficient documentation

## 2010-10-31 DIAGNOSIS — R5381 Other malaise: Secondary | ICD-10-CM | POA: Insufficient documentation

## 2010-10-31 LAB — CK TOTAL AND CKMB (NOT AT ARMC)
CK, MB: 2.4 ng/mL (ref 0.3–4.0)
Relative Index: INVALID (ref 0.0–2.5)
Total CK: 68 U/L (ref 7–177)

## 2010-10-31 LAB — DIFFERENTIAL
Basophils Absolute: 0.1 10*3/uL (ref 0.0–0.1)
Basophils Relative: 1 % (ref 0–1)
Lymphocytes Relative: 33 % (ref 12–46)
Neutro Abs: 4.2 10*3/uL (ref 1.7–7.7)
Neutrophils Relative %: 53 % (ref 43–77)

## 2010-10-31 LAB — TROPONIN I: Troponin I: <0.3 ng/mL (ref <0.30)

## 2010-10-31 LAB — URINALYSIS, ROUTINE W REFLEX MICROSCOPIC
Glucose, UA: NEGATIVE mg/dL
Leukocytes, UA: NEGATIVE
Specific Gravity, Urine: 1.022 (ref 1.005–1.030)
pH: 6 (ref 5.0–8.0)

## 2010-10-31 LAB — CBC
Hemoglobin: 11.9 g/dL — ABNORMAL LOW (ref 12.0–15.0)
RBC: 4.15 MIL/uL (ref 3.87–5.11)

## 2010-10-31 LAB — COMPREHENSIVE METABOLIC PANEL
AST: 17 U/L (ref 0–37)
CO2: 26 mEq/L (ref 19–32)
Calcium: 8.6 mg/dL (ref 8.4–10.5)
Creatinine, Ser: 0.96 mg/dL (ref 0.50–1.10)
GFR calc non Af Amer: 56 mL/min — ABNORMAL LOW (ref >60)

## 2010-11-01 ENCOUNTER — Other Ambulatory Visit: Payer: Self-pay | Admitting: Cardiothoracic Surgery

## 2010-11-01 DIAGNOSIS — C3490 Malignant neoplasm of unspecified part of unspecified bronchus or lung: Secondary | ICD-10-CM

## 2010-12-11 DIAGNOSIS — R918 Other nonspecific abnormal finding of lung field: Secondary | ICD-10-CM | POA: Insufficient documentation

## 2010-12-11 DIAGNOSIS — F0390 Unspecified dementia without behavioral disturbance: Secondary | ICD-10-CM | POA: Insufficient documentation

## 2010-12-11 DIAGNOSIS — E785 Hyperlipidemia, unspecified: Secondary | ICD-10-CM

## 2010-12-13 ENCOUNTER — Encounter: Payer: Self-pay | Admitting: Cardiothoracic Surgery

## 2010-12-13 ENCOUNTER — Ambulatory Visit
Admission: RE | Admit: 2010-12-13 | Discharge: 2010-12-13 | Disposition: A | Discharge status: Home / Self Care | Payer: Medicare Other | Source: Ambulatory Visit | Attending: Cardiothoracic Surgery | Admitting: Cardiothoracic Surgery

## 2010-12-13 ENCOUNTER — Ambulatory Visit (INDEPENDENT_AMBULATORY_CARE_PROVIDER_SITE_OTHER): Payer: Medicare Other | Admitting: Cardiothoracic Surgery

## 2010-12-13 VITALS — BP 140/80 | HR 62 | Resp 18 | Ht 62.0 in | Wt 99.0 lb

## 2010-12-13 DIAGNOSIS — Z85118 Personal history of other malignant neoplasm of bronchus and lung: Secondary | ICD-10-CM

## 2010-12-13 DIAGNOSIS — C3490 Malignant neoplasm of unspecified part of unspecified bronchus or lung: Secondary | ICD-10-CM

## 2010-12-13 MED ORDER — IOHEXOL 300 MG/ML  SOLN: 75.0000 mL | Freq: Once | INTRAMUSCULAR | Status: AC | PRN

## 2010-12-13 NOTE — Patient Instructions (Signed)
You will return for an annual CT of the chest next year.

## 2010-12-13 NOTE — Progress Notes (Signed)
PCP is Provider Not In System Referring Provider is Jarome Matin, MD  Chief Complaint  Patient presents with  . COPD    7 month f/u with ct of chest  . Routine Post Op    VATS Sept 2011 for non-small cell CA    HPI The patient returns for an annual CT scan of the chest one year after undergoing right vats and resection of a stage Ia bronchogenic carcinoma (adenocarcinoma). Past Medical History  Diagnosis Date  . Dementia   . Hyperlipidemia   . Lung mass   . COPD (chronic obstructive pulmonary disease)     Past Surgical History  Procedure Date  . Wedge resection rul nodule 01/10/2011    VAN TRIGT  . Mediastinal lymp node dissection 01/10/2011    VAN TRIGT    No family history on file.  Social History History  Substance Use Topics  . Smoking status: Former Games developer  . Smokeless tobacco: Not on file  . Alcohol Use: No    Current Outpatient Prescriptions  Medication Sig Dispense Refill  . atorvastatin (LIPITOR) 10 MG tablet Take 10 mg by mouth daily.        Marland Kitchen donepezil (ARICEPT) 10 MG tablet Take 10 mg by mouth at bedtime as needed.         No current facility-administered medications for this visit.   Facility-Administered Medications Ordered in Other Visits  Medication Dose Route Frequency Provider Last Rate Last Dose  . iohexol (OMNIPAQUE) 300 MG/ML injection 75 mL  75 mL Intravenous Once PRN Medication Radiologist        No Known Allergies   Review of Systems no weight loss or fever since her last visit. She is not resumed smoking. She denies hemoptysis or difficulty with the surgical incision.   Physical Exam Blood pressure 140/80 62 and regular oxygen saturation 98% on room air. She is alert and oriented x2. Her surgical incision is well-healed. Breath sounds are clear and equal bilaterally. Cardiac rhythm is regular murmur.  Diagnostic Tests CT scan of the chest is reviewed which reveals no evidence of recurrent cancer or new pulmonary nodules. No  pleural effusion or other abnormalities noted.  Impression Stable one year following pulmonary resection for stage I A. non-small cell cancer.  Plan She returned for CT scan of the chest and one year for annual surveillance following surgery.

## 2011-01-10 HISTORY — PX: OTHER SURGICAL HISTORY: SHX169

## 2011-10-29 ENCOUNTER — Other Ambulatory Visit: Payer: Self-pay | Admitting: Cardiothoracic Surgery

## 2011-10-29 DIAGNOSIS — D381 Neoplasm of uncertain behavior of trachea, bronchus and lung: Secondary | ICD-10-CM

## 2011-11-29 ENCOUNTER — Other Ambulatory Visit: Payer: Self-pay | Admitting: Nephrology

## 2011-12-03 ENCOUNTER — Other Ambulatory Visit: Payer: Self-pay | Admitting: *Deleted

## 2011-12-03 DIAGNOSIS — F0280 Dementia in other diseases classified elsewhere without behavioral disturbance: Secondary | ICD-10-CM

## 2011-12-03 MED ORDER — MEMANTINE HCL 10 MG PO TABS: 10.0000 mg | ORAL_TABLET | Freq: Two times a day (BID) | ORAL | Status: DC

## 2011-12-03 NOTE — Telephone Encounter (Signed)
Stacey Fernandez's daughter calls with concerns of not being able to get in touch with her mother's PCP, Dr. Jeri Cos.  She has visited the office x2 and no one has been there.  When she calls the office the number has been temporarily disconnected.  Her mother is in desperate need of her dementia med.  I,myself, made an attempt to find the whereabouts of Dr. Bascom Levels without success.  Stacey Fernandez has an appointment to see Dr. Donata Clay in the very near future.  As a courtesy, a refill for the Namenda will be granted.

## 2011-12-11 ENCOUNTER — Other Ambulatory Visit: Payer: Self-pay | Admitting: Nephrology

## 2011-12-12 ENCOUNTER — Encounter: Payer: Self-pay | Admitting: Cardiothoracic Surgery

## 2011-12-12 ENCOUNTER — Ambulatory Visit (INDEPENDENT_AMBULATORY_CARE_PROVIDER_SITE_OTHER): Payer: Medicare Other | Admitting: Cardiothoracic Surgery

## 2011-12-12 ENCOUNTER — Ambulatory Visit
Admission: RE | Admit: 2011-12-12 | Discharge: 2011-12-12 | Disposition: A | Discharge status: Home / Self Care | Payer: Medicare Other | Source: Ambulatory Visit | Attending: Cardiothoracic Surgery | Admitting: Cardiothoracic Surgery

## 2011-12-12 VITALS — BP 135/68 | HR 68 | Resp 18 | Ht 62.0 in | Wt 100.0 lb

## 2011-12-12 DIAGNOSIS — F0390 Unspecified dementia without behavioral disturbance: Secondary | ICD-10-CM

## 2011-12-12 DIAGNOSIS — Z85118 Personal history of other malignant neoplasm of bronchus and lung: Secondary | ICD-10-CM

## 2011-12-12 DIAGNOSIS — D381 Neoplasm of uncertain behavior of trachea, bronchus and lung: Secondary | ICD-10-CM

## 2011-12-12 NOTE — Progress Notes (Signed)
PCP is Provider Not In System Referring Provider is Jeri Cos, MD                        [address] [place].[address]            [place] [address]          [phone]    Chief Complaint  Patient presents with  . Follow-up    1 year f/u with Chest CT, annual serveillance, S/P Rt VATS and resection of bronchogenic carcinoma on 01/10/11     HPI: 78 year old female nonsmoker returns for one year followup after right upper lobe lobe resection of a stage I non-small cell carcinoma-adenocarcinoma. No  postop chemotherapy needed The patient has had no pulmonary symptoms of chest pain productive cough but does have some dyspnea on exertion The patient's dementia has progressed according to her daughter   Past Medical History  Diagnosis Date  . Dementia   . Hyperlipidemia   . Lung mass   . COPD (chronic obstructive pulmonary disease)     Past Surgical History  Procedure Date  . Wedge resection rul nodule 01/10/2011    VAN TRIGT  . Mediastinal lymp node dissection 01/10/2011    VAN TRIGT    No family history on file.  Social History History  Substance Use Topics  . Smoking status: Former Games developer  . Smokeless tobacco: Not on file  . Alcohol Use: No    Current Outpatient Prescriptions  Medication Sig Dispense Refill  . atorvastatin (LIPITOR) 10 MG tablet Take 10 mg by mouth daily.        Marland Kitchen donepezil (ARICEPT) 10 MG tablet Take 10 mg by mouth at bedtime as needed.        . memantine (NAMENDA) 10 MG tablet Take 1 tablet (10 mg total) by mouth 2 (two) times daily.  60 tablet  1    No Known Allergies  Review of Systems dementia some shortness of breath with exertion no chest pain no weight loss BP 135/68  Pulse 68  Resp 18  Ht 5\' 2"  (1.575 m)  Wt 100 lb (45.36 kg)  BMI 18.29 kg/m2  SpO2 99% Physical Exam Responsive and appropriate Lungs clear No palpable adenopathy Right thoracotomy incision well-healed  Diagnostic Tests: CT scan of the chest shows no  evidence recurrence no new poor nodules no abnormal Eady style nodes  Impression: 1 year cancer free after right upper lobe resection for stage I A. non-small cell carcinoma lung  Plan: Continue annual CT scan of the chest up to 5 years or surveillance.

## 2012-07-05 ENCOUNTER — Other Ambulatory Visit (HOSPITAL_BASED_OUTPATIENT_CLINIC_OR_DEPARTMENT_OTHER): Payer: Self-pay | Admitting: Internal Medicine

## 2012-07-29 ENCOUNTER — Encounter: Payer: Self-pay | Admitting: Internal Medicine

## 2012-07-29 DIAGNOSIS — R5381 Other malaise: Secondary | ICD-10-CM | POA: Insufficient documentation

## 2012-07-29 DIAGNOSIS — J449 Chronic obstructive pulmonary disease, unspecified: Secondary | ICD-10-CM | POA: Insufficient documentation

## 2012-07-29 DIAGNOSIS — R413 Other amnesia: Secondary | ICD-10-CM | POA: Insufficient documentation

## 2012-07-29 DIAGNOSIS — F0390 Unspecified dementia without behavioral disturbance: Secondary | ICD-10-CM | POA: Insufficient documentation

## 2012-07-29 DIAGNOSIS — R350 Frequency of micturition: Secondary | ICD-10-CM | POA: Insufficient documentation

## 2012-07-29 DIAGNOSIS — Z0289 Encounter for other administrative examinations: Secondary | ICD-10-CM

## 2012-07-29 DIAGNOSIS — G309 Alzheimer's disease, unspecified: Secondary | ICD-10-CM | POA: Insufficient documentation

## 2012-07-29 DIAGNOSIS — F411 Generalized anxiety disorder: Secondary | ICD-10-CM | POA: Insufficient documentation

## 2012-07-29 DIAGNOSIS — I5021 Acute systolic (congestive) heart failure: Secondary | ICD-10-CM | POA: Insufficient documentation

## 2012-07-29 DIAGNOSIS — E785 Hyperlipidemia, unspecified: Secondary | ICD-10-CM | POA: Insufficient documentation

## 2012-07-29 DIAGNOSIS — J4489 Other specified chronic obstructive pulmonary disease: Secondary | ICD-10-CM | POA: Insufficient documentation

## 2012-07-29 DIAGNOSIS — C3490 Malignant neoplasm of unspecified part of unspecified bronchus or lung: Secondary | ICD-10-CM | POA: Insufficient documentation

## 2012-07-29 DIAGNOSIS — I270 Primary pulmonary hypertension: Secondary | ICD-10-CM | POA: Insufficient documentation

## 2012-07-29 DIAGNOSIS — R404 Transient alteration of awareness: Secondary | ICD-10-CM | POA: Insufficient documentation

## 2012-09-22 ENCOUNTER — Encounter: Payer: Self-pay | Admitting: *Deleted

## 2012-09-23 ENCOUNTER — Encounter: Payer: Self-pay | Admitting: Internal Medicine

## 2012-09-23 ENCOUNTER — Ambulatory Visit (INDEPENDENT_AMBULATORY_CARE_PROVIDER_SITE_OTHER): Payer: Medicare Other | Admitting: Internal Medicine

## 2012-09-23 VITALS — BP 122/72 | HR 64 | Temp 97.1°F | Resp 14 | Ht 62.0 in | Wt 99.6 lb

## 2012-09-23 DIAGNOSIS — N289 Disorder of kidney and ureter, unspecified: Secondary | ICD-10-CM

## 2012-09-23 DIAGNOSIS — F0280 Dementia in other diseases classified elsewhere without behavioral disturbance: Secondary | ICD-10-CM

## 2012-09-23 DIAGNOSIS — F411 Generalized anxiety disorder: Secondary | ICD-10-CM

## 2012-09-23 DIAGNOSIS — G309 Alzheimer's disease, unspecified: Secondary | ICD-10-CM

## 2012-09-23 DIAGNOSIS — E785 Hyperlipidemia, unspecified: Secondary | ICD-10-CM

## 2012-09-23 NOTE — Progress Notes (Signed)
  Subjective:    Patient ID: Stacey Fernandez, female    DOB: 09-24-1929, 79 y.o.   MRN: [id number]  HPI Has dementia and is on donepezil and namenda She saw her dentist 3 weeks back and was started on antibiotics for infection. She has completed a week course of antibiotics and sees the dentist for follow up in 2 days  Review of Systems  Constitutional: Negative for fever, chills and appetite change.  HENT: Positive for dental problem. Negative for sore throat, mouth sores, neck pain and sinus pressure.   Eyes: Negative for visual disturbance.  Respiratory: Negative for cough and shortness of breath.   Cardiovascular: Negative for chest pain, palpitations and leg swelling.  Gastrointestinal: Negative for nausea, vomiting, abdominal pain and constipation.  Genitourinary: Negative for dysuria.  Musculoskeletal: Negative for joint swelling, arthralgias and gait problem.       No use of assisitive device No falls or trauma reported  Skin: Negative for rash.  Neurological: Positive for tremors. Negative for dizziness, weakness and light-headedness.       Occassional tremors noted especially when anxious  Psychiatric/Behavioral: Negative for behavioral problems, sleep disturbance and agitation.       Lives with her two brothers. Her niece is the main caregiver.       Objective:   Physical Exam  BP 122/72  Pulse 64  Temp(Src) 97.1 F (36.2 C) (Oral)  Resp 14  Ht 5\' 2"  (1.575 m)  Wt 99 lb 9.6 oz (45.178 kg)  BMI 18.21 kg/m2  gen- thin body habitus, in no acute distress heent- no pallor, no icterus, no LAD, MMM, no JVD, has several fillings in her teeth respi- b/l cta, no wheeze or rhonchi cvs- n s1,s2, rrr abdo- bs present, soft, non tender Neuro- aaox 2, non focal     Assessment & Plan:   Impaired renal function- avoid NSAIDs. Check bmp  alzhimer- continue namenda and aricept current regimen. No new behavioral changes.   Hyperlipidemia- continue statin and monitor  patient

## 2012-09-24 LAB — BASIC METABOLIC PANEL
BUN: 18 mg/dL (ref 8–27)
Calcium: 9.4 mg/dL (ref 8.6–10.2)
Creatinine, Ser: 0.93 mg/dL (ref 0.57–1.00)
GFR calc Af Amer: 67 mL/min/{1.73_m2} (ref >59)
GFR calc non Af Amer: 58 mL/min/{1.73_m2} — ABNORMAL LOW (ref >59)
Glucose: 84 mg/dL (ref 65–99)

## 2012-10-14 ENCOUNTER — Telehealth: Payer: Self-pay | Admitting: *Deleted

## 2012-10-14 NOTE — Telephone Encounter (Signed)
Lab results were given 

## 2012-10-22 ENCOUNTER — Ambulatory Visit (INDEPENDENT_AMBULATORY_CARE_PROVIDER_SITE_OTHER): Payer: Medicare Other | Admitting: Nurse Practitioner

## 2012-10-22 ENCOUNTER — Encounter: Payer: Self-pay | Admitting: Nurse Practitioner

## 2012-10-22 VITALS — BP 122/74 | HR 75 | Temp 97.6°F | Resp 13 | Ht 62.0 in | Wt 98.4 lb

## 2012-10-22 DIAGNOSIS — L259 Unspecified contact dermatitis, unspecified cause: Secondary | ICD-10-CM

## 2012-10-22 NOTE — Progress Notes (Signed)
Patient ID: Stacey Fernandez, female   DOB: Mar 19, 1930, 79 y.o.   MRN: [id number]   No Known Allergies  Chief Complaint  Patient presents with  . Rash    Rash on Left Arm    HPI: Patient is a 79 y.o. female  seen in the office today for rash on left arm that has been ongoing for 2-3 days; itching; has gotten a little larger today  Only on left forearm; did not know what to put on it. Using Aveeno soap and lotion   Review of Systems:  Review of Systems  Constitutional: Negative for fever, chills and malaise/fatigue.  Respiratory: Negative for shortness of breath.   Cardiovascular: Negative for chest pain.  Skin: Positive for rash.       Itchy rash on forearm  Neurological: Negative for weakness.     Past Medical History  Diagnosis Date  . Dementia   . Hyperlipidemia   . Lung mass   . COPD (chronic obstructive pulmonary disease)   . Senile dementia with delirium   . Urinary frequency   . Hemiplegia affecting dominant side, late effect of cerebrovascular disease   . Anxiety state, unspecified   . Chronic airway obstruction, not elsewhere classified   . Other malaise and fatigue   . Memory loss    Past Surgical History  Procedure Laterality Date  . Wedge resection rul nodule  01/10/2011    VAN TRIGT  . Mediastinal lymp node dissection  01/10/2011    VAN TRIGT  . Eye surgery      cataract   Social History:   reports that she has quit smoking. She does not have any smokeless tobacco history on file. She reports that she does not drink alcohol or use illicit drugs.  Family History  Problem Relation Age of Onset  . Heart disease Mother   . Cancer Father   . Heart disease Sister   . Heart disease Brother     Medications: Patient's Medications  New Prescriptions   No medications on file  Previous Medications   ASPIRIN 81 MG TABLET    Take 81 mg by mouth daily.   ATORVASTATIN (LIPITOR) 10 MG TABLET    Take 10 mg by mouth daily.     DONEPEZIL (ARICEPT) 10 MG  TABLET    Take 10 mg by mouth at bedtime as needed.     NAMENDA 10 MG TABLET    TAKE 1 TABLET BY MOUTH TWICE A DAY  Modified Medications   No medications on file  Discontinued Medications   No medications on file     Physical Exam:  Filed Vitals:   10/22/12 1243  BP: 122/74  Pulse: 75  Temp: 97.6 F (36.4 C)  TempSrc: Oral  Resp: 13  Height: 5\' 2"  (1.575 m)  Weight: 98 lb 6.4 oz (44.634 kg)    Physical Exam  Constitutional: She is well-developed, well-nourished, and in no distress. No distress.  Cardiovascular: Normal rate.   Pulmonary/Chest: Effort normal and breath sounds normal.  Abdominal: Soft. Bowel sounds are normal.  Musculoskeletal: She exhibits no edema and no tenderness.  Neurological: She is alert.  Skin: Skin is warm and dry. Rash noted. Rash is maculopapular (mild with areas of excoriation). Rash is not pustular, not vesicular and not urticarial. She is not diaphoretic.       Labs reviewed: Basic Metabolic Panel:  Recent Labs  91/47/82 1027  NA 144  K 5.0  CL 107  CO2 25  GLUCOSE  84  BUN 18  CREATININE 0.93  CALCIUM 9.4    Assessment/Plan contact dermatitis   To apply hydrocortisone 1% cream to effected area twice daily until resolved- to follow up if rash gets worse or spreads with treatment- also if rash does not resolve with or improve with treatment;  may cont to use Aveeno lotion and soap

## 2012-10-22 NOTE — Patient Instructions (Signed)
Use Hydrocortisone ointment 1% to rash twice daily until rash has resolved  Contact Dermatitis Contact dermatitis is a reaction to certain substances that touch the skin. Contact dermatitis can be either irritant contact dermatitis or allergic contact dermatitis. Irritant contact dermatitis does not require previous exposure to the substance for a reaction to occur.Allergic contact dermatitis only occurs if you have been exposed to the substance before. Upon a repeat exposure, your body reacts to the substance.  CAUSES  Many substances can cause contact dermatitis. Irritant dermatitis is most commonly caused by repeated exposure to mildly irritating substances, such as:  Makeup.  Soaps.  Detergents.  Bleaches.  Acids.  Metal salts, such as nickel. Allergic contact dermatitis is most commonly caused by exposure to:  Poisonous plants.  Chemicals (deodorants, shampoos).  Jewelry.  Latex.  Neomycin in triple antibiotic cream.  Preservatives in products, including clothing. SYMPTOMS  The area of skin that is exposed may develop:  Dryness or flaking.  Redness.  Cracks.  Itching.  Pain or a burning sensation.  Blisters. With allergic contact dermatitis, there may also be swelling in areas such as the eyelids, mouth, or genitals.  DIAGNOSIS  Your caregiver can usually tell what the problem is by doing a physical exam. In cases where the cause is uncertain and an allergic contact dermatitis is suspected, a patch skin test may be performed to help determine the cause of your dermatitis. TREATMENT Treatment includes protecting the skin from further contact with the irritating substance by avoiding that substance if possible. Barrier creams, powders, and gloves may be helpful. Your caregiver may also recommend:  Steroid creams or ointments applied 2 times daily. For best results, soak the rash area in cool water for 20 minutes. Then apply the medicine. Cover the area with a  plastic wrap. You can store the steroid cream in the refrigerator for a "chilly" effect on your rash. That may decrease itching. Oral steroid medicines may be needed in more severe cases.  Antibiotics or antibacterial ointments if a skin infection is present.  Antihistamine lotion or an antihistamine taken by mouth to ease itching.  Lubricants to keep moisture in your skin.  Burow's solution to reduce redness and soreness or to dry a weeping rash. Mix one packet or tablet of solution in 2 cups cool water. Dip a clean washcloth in the mixture, wring it out a bit, and put it on the affected area. Leave the cloth in place for 30 minutes. Do this as often as possible throughout the day.  Taking several cornstarch or baking soda baths daily if the area is too large to cover with a washcloth. Harsh chemicals, such as alkalis or acids, can cause skin damage that is like a burn. You should flush your skin for 15 to 20 minutes with cold water after such an exposure. You should also seek immediate medical care after exposure. Bandages (dressings), antibiotics, and pain medicine may be needed for severely irritated skin.  HOME CARE INSTRUCTIONS  Avoid the substance that caused your reaction.  Keep the area of skin that is affected away from hot water, soap, sunlight, chemicals, acidic substances, or anything else that would irritate your skin.  Do not scratch the rash. Scratching may cause the rash to become infected.  You may take cool baths to help stop the itching.  Only take over-the-counter or prescription medicines as directed by your caregiver.  See your caregiver for follow-up care as directed to make sure your skin is healing properly.  SEEK MEDICAL CARE IF:   Your condition is not better after 3 days of treatment.  You seem to be getting worse.  You see signs of infection such as swelling, tenderness, redness, soreness, or warmth in the affected area.  You have any problems related to  your medicines. Document Released: 03/30/2000 Document Revised: 06/25/2011 Document Reviewed: 09/05/2010 [facility] Patient Information 2014 [place], [place].

## 2012-10-23 ENCOUNTER — Encounter: Payer: Self-pay | Admitting: Internal Medicine

## 2012-11-13 ENCOUNTER — Other Ambulatory Visit: Payer: Self-pay

## 2012-11-13 DIAGNOSIS — D381 Neoplasm of uncertain behavior of trachea, bronchus and lung: Secondary | ICD-10-CM

## 2012-11-19 ENCOUNTER — Other Ambulatory Visit (HOSPITAL_BASED_OUTPATIENT_CLINIC_OR_DEPARTMENT_OTHER): Payer: Self-pay | Admitting: Internal Medicine

## 2012-12-10 ENCOUNTER — Ambulatory Visit: Payer: Medicare Other | Admitting: Cardiothoracic Surgery

## 2012-12-10 ENCOUNTER — Other Ambulatory Visit: Payer: Medicare Other

## 2012-12-16 NOTE — Progress Notes (Signed)
This encounter was created in error - please disregard.

## 2012-12-17 ENCOUNTER — Other Ambulatory Visit: Payer: Self-pay | Admitting: Cardiothoracic Surgery

## 2012-12-17 ENCOUNTER — Inpatient Hospital Stay: Admission: RE | Admit: 2012-12-17 | Payer: Medicare Other | Source: Ambulatory Visit

## 2012-12-17 ENCOUNTER — Ambulatory Visit: Payer: Medicare Other | Admitting: Cardiothoracic Surgery

## 2012-12-17 LAB — BUN: BUN: 16 mg/dL (ref 6–23)

## 2012-12-17 LAB — CREATININE, SERUM: Creat: 0.92 mg/dL (ref 0.50–1.10)

## 2012-12-24 ENCOUNTER — Ambulatory Visit: Payer: Medicare Other | Admitting: Internal Medicine

## 2012-12-24 ENCOUNTER — Ambulatory Visit
Admission: RE | Admit: 2012-12-24 | Discharge: 2012-12-24 | Disposition: A | Discharge status: Home / Self Care | Payer: Medicare Other | Source: Ambulatory Visit | Attending: Cardiothoracic Surgery | Admitting: Cardiothoracic Surgery

## 2012-12-24 ENCOUNTER — Ambulatory Visit: Payer: Medicare Other | Admitting: Cardiothoracic Surgery

## 2012-12-24 DIAGNOSIS — D381 Neoplasm of uncertain behavior of trachea, bronchus and lung: Secondary | ICD-10-CM

## 2012-12-24 MED ORDER — IOHEXOL 300 MG/ML  SOLN
75.0000 mL | Freq: Once | INTRAMUSCULAR | Status: AC | PRN
  Administered 2012-12-24: 75 mL via INTRAVENOUS

## 2012-12-26 ENCOUNTER — Ambulatory Visit (INDEPENDENT_AMBULATORY_CARE_PROVIDER_SITE_OTHER): Payer: Medicare Other | Admitting: Cardiothoracic Surgery

## 2012-12-26 ENCOUNTER — Encounter: Payer: Self-pay | Admitting: Cardiothoracic Surgery

## 2012-12-26 DIAGNOSIS — C3410 Malignant neoplasm of upper lobe, unspecified bronchus or lung: Secondary | ICD-10-CM

## 2012-12-26 DIAGNOSIS — Z09 Encounter for follow-up examination after completed treatment for conditions other than malignant neoplasm: Secondary | ICD-10-CM

## 2012-12-26 NOTE — Progress Notes (Signed)
PCP is Provider Not In System Referring Provider is Jeri Cos, MD  Chief Complaint  Patient presents with  . Routine Post Op    1 yr f/u with CT CHEST    HPI: 79 year old female presents for 2 year followup after right VATS and wedge resection of stage I A. non-small cell carcinoma. No postoperative adjuvant therapy performed. Patient without pulmonary or systemic symptoms. Patient lives with daughter.  Past Medical History  Diagnosis Date  . Dementia   . Hyperlipidemia   . Lung mass   . COPD (chronic obstructive pulmonary disease)   . Senile dementia with delirium   . Urinary frequency   . Hemiplegia affecting dominant side, late effect of cerebrovascular disease   . Anxiety state, unspecified   . Chronic airway obstruction, not elsewhere classified   . Other malaise and fatigue   . Memory loss     Past Surgical History  Procedure Laterality Date  . Wedge resection rul nodule  01/10/2011    VAN TRIGT  . Mediastinal lymp node dissection  01/10/2011    VAN TRIGT  . Eye surgery      cataract    Family History  Problem Relation Age of Onset  . Heart disease Mother   . Cancer Father   . Heart disease Sister   . Heart disease Brother     Social History History  Substance Use Topics  . Smoking status: Former Games developer  . Smokeless tobacco: Not on file  . Alcohol Use: No    Current Outpatient Prescriptions  Medication Sig Dispense Refill  . aspirin 81 MG tablet Take 81 mg by mouth daily.      Marland Kitchen atorvastatin (LIPITOR) 10 MG tablet Take 10 mg by mouth daily.        Marland Kitchen donepezil (ARICEPT) 10 MG tablet Take 10 mg by mouth at bedtime as needed.        Marland Kitchen NAMENDA 10 MG tablet TAKE 1 TABLET BY MOUTH TWICE A DAY  60 tablet  2   No current facility-administered medications for this visit.    No Known Allergies  Review of Systems no fever or weight loss No hospitalizations within the past 6 months  BP 136/80  Pulse 75  Resp 16  Ht 5\' 2"  (1.575 m)  Wt 95 lb  (43.092 kg)  BMI 17.37 kg/m2  SpO2 98% Physical Exam Alert and pleasant Breath sounds clear and equal Cardiac rhythm regular No palpable cervical adenopathy No pedal edema or calf tenderness No neural deficit  Diagnostic Tests: CT scan of chest shows no evidence recurrent lung cancer, no adenopathy no new nodules  Impression: Clean CT scan just 2 years after resection of stage I a non small cell carcinoma lung  Plan: Return in one year with CT scan for surveillance of recurrence

## 2013-01-16 ENCOUNTER — Other Ambulatory Visit: Payer: Self-pay | Admitting: *Deleted

## 2013-01-16 MED ORDER — DONEPEZIL HCL 10 MG PO TABS: 10.0000 mg | ORAL_TABLET | Freq: Every evening | ORAL | Status: DC | PRN

## 2013-01-16 MED ORDER — ATORVASTATIN CALCIUM 10 MG PO TABS: 10.0000 mg | ORAL_TABLET | Freq: Every day | ORAL | Status: DC

## 2013-02-04 ENCOUNTER — Ambulatory Visit (INDEPENDENT_AMBULATORY_CARE_PROVIDER_SITE_OTHER): Payer: Medicare Other | Admitting: Internal Medicine

## 2013-02-04 ENCOUNTER — Encounter: Payer: Self-pay | Admitting: Internal Medicine

## 2013-02-04 VITALS — BP 112/64 | HR 76 | Temp 96.4°F | Wt 94.0 lb

## 2013-02-04 DIAGNOSIS — R0609 Other forms of dyspnea: Secondary | ICD-10-CM | POA: Insufficient documentation

## 2013-02-04 DIAGNOSIS — L89311 Pressure ulcer of right buttock, stage 1: Secondary | ICD-10-CM | POA: Insufficient documentation

## 2013-02-04 DIAGNOSIS — L8991 Pressure ulcer of unspecified site, stage 1: Secondary | ICD-10-CM

## 2013-02-04 DIAGNOSIS — E785 Hyperlipidemia, unspecified: Secondary | ICD-10-CM

## 2013-02-04 DIAGNOSIS — F0280 Dementia in other diseases classified elsewhere without behavioral disturbance: Secondary | ICD-10-CM

## 2013-02-04 DIAGNOSIS — L89309 Pressure ulcer of unspecified buttock, unspecified stage: Secondary | ICD-10-CM

## 2013-02-04 MED ORDER — RESTORE BARRIER CREA: 1.0000 "application " | TOPICAL_CREAM | Freq: Every day | Status: DC

## 2013-02-04 NOTE — Progress Notes (Signed)
Patient ID: Stacey Fernandez, female   DOB: 12-28-1929, 79 y.o.   MRN: [id number]  Physical exam Skin- has non blanching erythema in right buttock area, no skin tear  Labs reviewed and ct chest and echocardiogram 2011 reviewed. See imaging section  Assessment/plan  Stage 1 pressure ulcer- on right buttock. will have her apply barrier cream to affected area and then have wedge cushion while sitting for long time to help take pressure off  Dyspnea- normal ct chest recently. Normal cardiac exam. Normal lung exam. Has hx of anemia in past. Check cbc to rule out anemia. Will get echocardiogram given her hx of LVH and pulmonary hypertension in past to assess for worsening of EF and valvular abnormalities  impaired renal function- avoid NSAIDs. Check bmp today  alzhimer- continue namenda and aricept current regimen. No new behavioral changes.   Hyperlipidemia- continue statin and monitor patient. Continue baby aspirin  Labs- cbc, bmp

## 2013-02-04 NOTE — Progress Notes (Signed)
Patient ID: Stacey Fernandez, female   DOB: 1929-07-11, 79 y.o.   MRN: [id number]  Chief Complaint  Patient presents with  . Medical Managment of Chronic Issues    3 month follow-up   . Back Problem   No Known Allergies  HPI 79 y/o female patient is here for routine follow up visit. She is s/p right VATS and wedge resection of stage I A. non-small cell carcinoma and recent CT chest was negative for recurrence of cancer. She has been getting winded easily after walking short distance for few months. Her sister is here with her today and is concerned about skin issues around her rear end. Denies any skin tear or discharge. Has dementia and is on donepezil and namenda. No recent behavior changes. No bowel or bladder incontinence  Review of Systems  Constitutional: Negative for fever, chills and appetite change.  HENT: Negative for sore throat, mouth sores, neck pain and sinus pressure.   Eyes: Negative for visual disturbance. corrective glasses used Respiratory: Negative for cough. Gets winded after walking small distance and this is going on for couple of months Cardiovascular: Negative for chest pain, palpitations and leg swelling.  Gastrointestinal: Negative for nausea, vomiting, abdominal pain and constipation.  Genitourinary: Negative for dysuria.  Musculoskeletal: Negative for joint swelling, arthralgias and gait problem.  No use of assisitive device No falls or trauma reported  Skin: Negative for rash.  Neurological: Positive for tremors. Negative for dizziness, weakness and light-headedness.       Occassional tremors noted especially when anxious  Psychiatric/Behavioral: Negative for behavioral problems, sleep disturbance and agitation.        Lives with her two brothers. Her niece is the main caregiver  Past Medical History  Diagnosis Date  . Dementia   . Hyperlipidemia   . Lung mass   . COPD (chronic obstructive pulmonary disease)   . Senile dementia with delirium   .  Urinary frequency   . Hemiplegia affecting dominant side, late effect of cerebrovascular disease   . Anxiety state, unspecified   . Chronic airway obstruction, not elsewhere classified   . Other malaise and fatigue   . Memory loss    Past Surgical History  Procedure Laterality Date  . Wedge resection rul nodule  01/10/2011    VAN TRIGT  . Mediastinal lymp node dissection  01/10/2011    VAN TRIGT  . Eye surgery      cataract   Current Outpatient Prescriptions on File Prior to Visit  Medication Sig Dispense Refill  . aspirin 81 MG tablet Take 81 mg by mouth daily.      Marland Kitchen atorvastatin (LIPITOR) 10 MG tablet Take 1 tablet (10 mg total) by mouth daily.  30 tablet  0  . donepezil (ARICEPT) 10 MG tablet Take 1 tablet (10 mg total) by mouth at bedtime as needed.  30 tablet  0  . NAMENDA 10 MG tablet TAKE 1 TABLET BY MOUTH TWICE A DAY  60 tablet  2   No current facility-administered medications on file prior to visit.    Physical exam BP 112/64  Pulse 76  Temp(Src) 96.4 F (35.8 C) (Oral)  Wt 94 lb (42.638 kg)  BMI 17.19 kg/m2  gen- thin body habitus, in no acute distress heent- no pallor, no icterus, no LAD, MMM, no JVD, has several fillings in her teeth respi- b/l cta, no wheeze or rhonchi cvs- n s1,s2, rrr abdo- bs present, soft, non tender Neuro- aaox 2, non focal Psych- flat  affect, staring on the walls, interacts minimally

## 2013-02-04 NOTE — Patient Instructions (Signed)
Remember to pick up WEDGE CUSHION from medical supply store    Pressure Ulcer A pressure ulcer is a sore that has formed from the break down of skin and exposure of deeper layers of tissue. It develops in areas of the body where there is unrelieved pressure. Pressure ulcers are usually found over a boney area, such as the shoulder blades, spine, lower back, hips, knees, ankles, and heels. RISK FACTORS  Decreased ability to move.  Decreased ability to feel pain or discomfort.  Excessive skin moisture from urine, stool, sweat, or secretions.  Poor nutrition.  Dehydration.  Tobacco, drug, or alcohol abuse.  Pulling sheets that are under a patient when changing his or her position.  Obesity.  Increased adult age.  Age of less than 2 years.  Premature newborns.  Hospitalization in a critical care unit for longer than four days with use of medical devices.  Prolonged use of medical devices.  Critical illness.  Anemia.  Traumatic brain injury.  Spinal cord injury.  Stroke.  Diabetes.  Poor blood glucose control.  Low blood pressure (hypotension).  Low oxygen levels.  Medicines that reduce blood flow.  Infection.  Obesity. STAGING PRESSURE ULCERS Your caregiver may determine the degree of severity (stage) of your pressure ulcer. The stages include:  Stage 1: The skin is red, and when the skin is pressed, it stays red.  Stage 2: The top layer of skin is gone, and there is a shallow, pink ulcer.  Stage 3: The ulcer becomes deeper, and it is more difficult to see the whole wound. Also, there may be yellow or brown parts, as well as pink and red parts.  Stage 4: The ulcer may be deep and red, pink, brown, white, or yellow. Bone or muscle may be seen.  Unstageable pressure ulcer: The ulcer is covered almost completely with black, brown, or yellow tissue. It is not known how deep the ulcer is or what stage it is until this covering comes off.  Suspected deep  tissue injury: A patient's skin can be injured from pressure or pulling on the skin when his or her position is changed. The skin appears purple or maroon. There may not be an opening in the skin, but there could be a blood-filled blister. This deep tissue injury is often difficult to see in people with darker skin tones. The site may open and become deeper in time. However, early interventions will help the area heal and may prevent the area from opening. DIAGNOSIS  Your caregiver will diagnose your pressure ulcer based on its appearance. Your caregiver may determine the stage of your pressure ulcer as well. Your caregiver may request tests to check for infection, assess your circulation, or to check for other diseases, such as diabetes. TREATMENT  Treatment of your pressure ulcer begins with determining what stage the ulcer is in. Your treatment team may include your caregiver, a wound care specialist, a nutritionist, a physical therapist, and a Careers adviser. Treatments include:   Moving or repositioning every 1 2 hours.  Using beds or mattresses to shift your body weight and pressure points frequently.  Improving your diet.  Cleaning and bandaging (dressing) the open wound.  Giving antibiotic medicines.  Removing damaged tissue.  Surgery and sometimes skin grafts. HOME CARE INSTRUCTIONS  Follow the care plan that was started in the hospital.  Avoid staying in the same position for more than 2 hours. Use padding, devices, or mattresses to cushion your pressure points as directed by your  caregiver.  Eat well. Take nutritional supplements and vitamins as directed by your caregiver.  Keep all follow-up appointments.  Take pain medicine as directed by your caregiver. SEEK MEDICAL CARE IF:   Your pressure ulcer is not improving.  You do not know how to care for your pressure ulcer.  You notice other areas of redness on your skin. SEEK IMMEDIATE MEDICAL CARE IF:   You have increasing  redness, swelling, or pain in your pressure ulcer.  You notice pus, or increased pus, coming from your pressure ulcer.  You have a fever.  You notice a bad smell coming from the wound or dressing.  Your pressure ulcer opens up again. MAKE SURE YOU:   Understand these instructions.  Will watch your condition.  Will get help right away if you are not doing well or get worse. Document Released: 04/02/2005 Document Revised: 03/19/2012 Document Reviewed: 11/17/2010 [facility] Patient Information 2014 [place], [place].

## 2013-02-05 LAB — CBC WITH DIFFERENTIAL/PLATELET
Basophils Absolute: 0 10*3/uL (ref 0.0–0.2)
Hemoglobin: 13.9 g/dL (ref 11.1–15.9)
Lymphs: 46 %
MCHC: 32.6 g/dL (ref 31.5–35.7)
Monocytes: 8 %
Neutrophils Absolute: 2.3 10*3/uL (ref 1.4–7.0)
RBC: 4.81 x10E6/uL (ref 3.77–5.28)

## 2013-02-05 LAB — BASIC METABOLIC PANEL
BUN: 14 mg/dL (ref 8–27)
CO2: 26 mmol/L (ref 18–29)
Calcium: 9.5 mg/dL (ref 8.6–10.2)
Chloride: 107 mmol/L (ref 97–108)
GFR calc Af Amer: 78 mL/min/{1.73_m2} (ref >59)
Glucose: 98 mg/dL (ref 65–99)

## 2013-02-06 ENCOUNTER — Other Ambulatory Visit: Payer: Self-pay | Admitting: *Deleted

## 2013-02-06 MED ORDER — RESTORE BARRIER CREA: 1.0000 "application " | TOPICAL_CREAM | Freq: Every day | Status: DC

## 2013-02-09 ENCOUNTER — Other Ambulatory Visit: Payer: Self-pay | Admitting: *Deleted

## 2013-02-09 MED ORDER — RESTORE BARRIER CREA: 1.0000 "application " | TOPICAL_CREAM | Freq: Every day | Status: DC

## 2013-02-16 ENCOUNTER — Ambulatory Visit: Payer: Medicare Other | Admitting: Cardiology

## 2013-02-17 ENCOUNTER — Other Ambulatory Visit: Payer: Self-pay | Admitting: Internal Medicine

## 2013-02-26 ENCOUNTER — Other Ambulatory Visit: Payer: Self-pay | Admitting: *Deleted

## 2013-02-26 ENCOUNTER — Other Ambulatory Visit (HOSPITAL_BASED_OUTPATIENT_CLINIC_OR_DEPARTMENT_OTHER): Payer: Self-pay | Admitting: Internal Medicine

## 2013-02-26 MED ORDER — MEMANTINE HCL 10 MG PO TABS: ORAL_TABLET | ORAL | Status: DC

## 2013-03-05 ENCOUNTER — Other Ambulatory Visit: Payer: Self-pay | Admitting: Internal Medicine

## 2013-03-06 ENCOUNTER — Encounter: Payer: Self-pay | Admitting: General Surgery

## 2013-03-06 ENCOUNTER — Ambulatory Visit (INDEPENDENT_AMBULATORY_CARE_PROVIDER_SITE_OTHER): Payer: Medicare Other | Admitting: Cardiology

## 2013-03-06 ENCOUNTER — Encounter: Payer: Self-pay | Admitting: Cardiology

## 2013-03-06 VITALS — BP 132/82 | HR 59 | Ht 60.0 in | Wt 98.0 lb

## 2013-03-06 DIAGNOSIS — R0609 Other forms of dyspnea: Secondary | ICD-10-CM

## 2013-03-06 DIAGNOSIS — I270 Primary pulmonary hypertension: Secondary | ICD-10-CM

## 2013-03-06 NOTE — Patient Instructions (Signed)
Your physician recommends that you continue on your current medications as directed. Please refer to the Current Medication list given to you today.  Your physician has requested that you have an echocardiogram. Echocardiography is a painless test that uses sound waves to create images of your heart. It provides your doctor with information about the size and shape of your heart and how well your heart's chambers and valves are working. This procedure takes approximately one hour. There are no restrictions for this procedure.  Your physician has requested that you have a lexiscan myoview. For further information please visit [email or web address] Please follow instruction sheet, as given.  Your physician recommends that you schedule a follow-up appointment in: As Needed

## 2013-03-06 NOTE — Progress Notes (Signed)
[address] [place], [place]  [address] Phone: [phone] Fax:  [phone]  Date:  03/06/2013   ID:  Stacey Fernandez, DOB 03/19/1930, MRN [id number]  PCP:  Provider Not In System  Cardiologist:  Armanda Magic, MD     History of Present Illness: Stacey Fernandez is a 79 y.o. female with a history of stage I A nonsmall cell lung CA s/p right VATS and wedge resection, COPD, dyslipidemia who presents for cardiac eval.  She had an echo done in 2011 which showed normal LVF but moderate TR and mild pulmonary HTN with PASP .  She denies any chest pain or pressure.  She has chronic SOB that is intermittent.  She denies any LE edema, palpitations or syncope.   Wt Readings from Last 3 Encounters:  03/06/13 98 lb (44.453 kg)  02/04/13 94 lb (42.638 kg)  12/26/12 95 lb (43.092 kg)     Past Medical History  Diagnosis Date  . Dementia   . Hyperlipidemia   . Lung mass   . COPD (chronic obstructive pulmonary disease)   . Senile dementia with delirium   . Urinary frequency   . Hemiplegia affecting dominant side, late effect of cerebrovascular disease   . Anxiety state, unspecified   . Chronic airway obstruction, not elsewhere classified   . Other malaise and fatigue   . Memory loss     Current Outpatient Prescriptions  Medication Sig Dispense Refill  . aspirin 81 MG tablet Take 81 mg by mouth daily.      Marland Kitchen atorvastatin (LIPITOR) 10 MG tablet TAKE 1 TABLET (10 MG TOTAL) BY MOUTH DAILY.  30 tablet  2  . donepezil (ARICEPT) 10 MG tablet 1 by mouth daily  30 tablet  3  . NAMENDA 10 MG tablet TAKE 1 TABLET TWICE DAILY  60 tablet  2  . protective barrier (RESTORE) CREA Apply 1 application topically daily.  120 g  0   No current facility-administered medications for this visit.    Allergies:   No Known Allergies  Social History:  The patient  reports that she has quit smoking. She does not have any smokeless tobacco history on file. She reports that she does not  drink alcohol or use illicit drugs.   Family History:  The patient's family history includes Cancer in her father; Heart disease in her brother, mother, and sister.   ROS:  Please see the history of present illness.      All other systems reviewed and negative.   PHYSICAL EXAM: VS:  BP 132/82  Pulse 59  Ht 5' (1.524 m)  Wt 98 lb (44.453 kg)  BMI 19.14 kg/m2 Well nourished, well developed, in no acute distress HEENT: normal Neck: no JVD Cardiac:  normal S1, S2; RRR; no murmur Lungs:  clear to auscultation bilaterally, no wheezing, rhonchi or rales Abd: soft, nontender, no hepatomegaly Ext: no edema Skin: warm and dry Neuro:  CNs 2-12 intact, no focal abnormalities noted  EKG:  NSR with septal infarct pattern     ASSESSMENT AND PLAN:  1. DOE most likely secondary to COPD and lung resection as well as sedentary state. She has not chest pain or pressure.  Her EKG shows a septal infarct pattern which may be seen in patient's with COPD based on where EKG leads are placed on chest if lungs are hyperinflated.  - I will get a Lexiscan myoview to rule out ischemia 2. Mild pulmonary HTN by echo 2011 felt  secondary to COPD  - check 2D echo to assess for pulmonary HTN   Followup with me on PRN basis if studies are normal  Signed, Armanda Magic, MD 03/06/2013 2:25 PM

## 2013-03-31 ENCOUNTER — Encounter: Payer: Self-pay | Admitting: Cardiology

## 2013-03-31 ENCOUNTER — Ambulatory Visit (HOSPITAL_BASED_OUTPATIENT_CLINIC_OR_DEPARTMENT_OTHER): Payer: Medicare Other | Admitting: Radiology

## 2013-03-31 ENCOUNTER — Ambulatory Visit (HOSPITAL_COMMUNITY): Payer: Medicare Other | Attending: Cardiology | Admitting: Cardiology

## 2013-03-31 VITALS — BP 159/88 | HR 60 | Ht 60.0 in | Wt 93.0 lb

## 2013-03-31 DIAGNOSIS — R9431 Abnormal electrocardiogram [ECG] [EKG]: Secondary | ICD-10-CM

## 2013-03-31 DIAGNOSIS — J4489 Other specified chronic obstructive pulmonary disease: Secondary | ICD-10-CM | POA: Insufficient documentation

## 2013-03-31 DIAGNOSIS — R0609 Other forms of dyspnea: Secondary | ICD-10-CM | POA: Insufficient documentation

## 2013-03-31 DIAGNOSIS — R0989 Other specified symptoms and signs involving the circulatory and respiratory systems: Secondary | ICD-10-CM | POA: Insufficient documentation

## 2013-03-31 DIAGNOSIS — Z85118 Personal history of other malignant neoplasm of bronchus and lung: Secondary | ICD-10-CM | POA: Insufficient documentation

## 2013-03-31 DIAGNOSIS — R0602 Shortness of breath: Secondary | ICD-10-CM

## 2013-03-31 DIAGNOSIS — J449 Chronic obstructive pulmonary disease, unspecified: Secondary | ICD-10-CM | POA: Insufficient documentation

## 2013-03-31 DIAGNOSIS — Z87891 Personal history of nicotine dependence: Secondary | ICD-10-CM | POA: Insufficient documentation

## 2013-03-31 DIAGNOSIS — F0390 Unspecified dementia without behavioral disturbance: Secondary | ICD-10-CM | POA: Insufficient documentation

## 2013-03-31 MED ORDER — TECHNETIUM TC 99M SESTAMIBI GENERIC - CARDIOLITE
11.0000 | Freq: Once | INTRAVENOUS | Status: AC | PRN
  Administered 2013-03-31: 11 via INTRAVENOUS

## 2013-03-31 MED ORDER — TECHNETIUM TC 99M SESTAMIBI GENERIC - CARDIOLITE
33.0000 | Freq: Once | INTRAVENOUS | Status: AC | PRN
  Administered 2013-03-31: 33 via INTRAVENOUS

## 2013-03-31 MED ORDER — REGADENOSON 0.4 MG/5ML IV SOLN
0.4000 mg | Freq: Once | INTRAVENOUS | Status: AC
  Administered 2013-03-31: 0.4 mg via INTRAVENOUS

## 2013-03-31 NOTE — Progress Notes (Signed)
Echo performed. 

## 2013-03-31 NOTE — Progress Notes (Signed)
[facility] SITE 3 NUCLEAR MED [address] [place], [place] [address] [phone]    Cardiology Nuclear Med Study  Stacey Fernandez is a 79 y.o. female     MRN : [id number]     DOB: [birth year]  Procedure Date: 03/31/2013  Nuclear Med Background Indication for Stress Test:  Evaluation for Ischemia and Abnormal [id number] Septal infarct History:  No known CAD, Echo 2011 EF 60-65%, COPD, Hx lung Ca, Dementia Cardiac Risk Factors: Family History - CAD, History of Smoking and Lipids  Symptoms:  DOE and SOB (chronic)   Nuclear Pre-Procedure Caffeine/Decaff Intake:  None NPO After: 7:00pm   Lungs:  clear O2 Sat: 95% on room air. IV 0.9% NS with Angio Cath:  22g  IV Site: R Hand  IV Started by:  Cathlyn Parsons, RN  Chest Size (in):  32 Cup Size: B  Height: 5' (1.524 m)  Weight:  93 lb (42.185 kg)  BMI:  Body mass index is 18.16 kg/(m^2). Tech Comments:  n/a    Nuclear Med Study 1 or 2 day study: 1 day  Stress Test Type:  Lexiscan  Reading MD: Armanda Magic, MD  Order Authorizing Provider:  Gloris Manchester Turner,MD  Resting Radionuclide: Technetium 46m Sestamibi  Resting Radionuclide Dose: 11.0 mCi   Stress Radionuclide:  Technetium 61m Sestamibi  Stress Radionuclide Dose: 33.0 mCi           Stress Protocol Rest HR: 60 Stress HR: 96  Rest BP: 159/88 Stress BP: 156/84  Exercise Time (min): n/a METS: n/a           Dose of Adenosine (mg):  n/a Dose of Lexiscan: 0.4 mg  Dose of Atropine (mg): n/a Dose of Dobutamine: n/a mcg/kg/min (at max HR)  Stress Test Technologist: Nelson Chimes, BS-ES  Nuclear Technologist:  Domenic Polite, CNMT     Rest Procedure:  Myocardial perfusion imaging was performed at rest 45 minutes following the intravenous administration of Technetium 73m Sestamibi. Rest ECG: NSR with non-specific ST-T wave changes  Stress Procedure:  The patient received IV Lexiscan 0.4 mg over 15-seconds.  Technetium 27m Sestamibi injected at 30-seconds.   Quantitative spect images were obtained after a 45 minute delay. Stress ECG: No significant change from baseline ECG  QPS Raw Data Images:  Mild breast attenuation.  Normal left ventricular size. Stress Images:  Normal homogeneous uptake in all areas of the myocardium. Rest Images:  Normal homogeneous uptake in all areas of the myocardium. Subtraction (SDS):  No evidence of ischemia. Transient Ischemic Dilatation (Normal <1.22):  1.07 Lung/Heart Ratio (Normal <0.45):  0.33  Quantitative Gated Spect Images QGS EDV:  46 ml QGS ESV:  21 ml  Impression Exercise Capacity:  Lexiscan with no exercise. BP Response:  Normal blood pressure response. Clinical Symptoms:  No significant symptoms noted. ECG Impression:  No significant ST segment change suggestive of ischemia. Comparison with Prior Nuclear Study: No previous nuclear study performed  Overall Impression:  Normal myoview stress test  LV Ejection Fraction: 54%.  LV Wall Motion:  NL LV Function; NL Wall Motion  Signed: Armanda Magic, MD 04/03/2013

## 2013-05-29 ENCOUNTER — Other Ambulatory Visit: Payer: Self-pay | Admitting: Internal Medicine

## 2013-05-29 NOTE — Telephone Encounter (Signed)
Patient needs to schedule a follow-up appointment and have fasting labs

## 2013-06-05 ENCOUNTER — Other Ambulatory Visit: Payer: Self-pay | Admitting: *Deleted

## 2013-06-05 MED ORDER — DONEPEZIL HCL 10 MG PO TABS: ORAL_TABLET | ORAL | Status: DC

## 2013-06-21 ENCOUNTER — Other Ambulatory Visit: Payer: Self-pay | Admitting: Nurse Practitioner

## 2013-07-06 ENCOUNTER — Other Ambulatory Visit: Payer: Self-pay | Admitting: Internal Medicine

## 2013-07-06 NOTE — Telephone Encounter (Signed)
LABS OVERDUE

## 2013-07-28 ENCOUNTER — Ambulatory Visit (INDEPENDENT_AMBULATORY_CARE_PROVIDER_SITE_OTHER): Payer: Medicare Other | Admitting: Internal Medicine

## 2013-07-28 ENCOUNTER — Other Ambulatory Visit: Payer: Self-pay | Admitting: Nurse Practitioner

## 2013-07-28 ENCOUNTER — Encounter: Payer: Self-pay | Admitting: Internal Medicine

## 2013-07-28 VITALS — BP 118/66 | HR 59 | Temp 98.0°F | Wt 89.0 lb

## 2013-07-28 DIAGNOSIS — J309 Allergic rhinitis, unspecified: Secondary | ICD-10-CM | POA: Insufficient documentation

## 2013-07-28 DIAGNOSIS — F0280 Dementia in other diseases classified elsewhere without behavioral disturbance: Secondary | ICD-10-CM

## 2013-07-28 DIAGNOSIS — R634 Abnormal weight loss: Secondary | ICD-10-CM

## 2013-07-28 DIAGNOSIS — G309 Alzheimer's disease, unspecified: Secondary | ICD-10-CM

## 2013-07-28 DIAGNOSIS — R609 Edema, unspecified: Secondary | ICD-10-CM

## 2013-07-28 DIAGNOSIS — E785 Hyperlipidemia, unspecified: Secondary | ICD-10-CM

## 2013-07-28 MED ORDER — MIRTAZAPINE 15 MG PO TABS: 7.5000 mg | ORAL_TABLET | Freq: Every day | ORAL | Status: DC

## 2013-07-28 MED ORDER — MEMANTINE HCL ER 28 MG PO CP24: 1.0000 | ORAL_CAPSULE | Freq: Every day | ORAL | Status: DC

## 2013-07-28 MED ORDER — FUROSEMIDE 20 MG PO TABS: 10.0000 mg | ORAL_TABLET | Freq: Every day | ORAL | Status: DC

## 2013-07-28 MED ORDER — SALINE SPRAY 0.65 % NA SOLN: 1.0000 | NASAL | Status: AC | PRN

## 2013-07-28 NOTE — Progress Notes (Signed)
Patient ID: Stacey Fernandez, female   DOB: 06-Mar-1930, 80 y.o.   MRN: [id number]    Chief Complaint  Patient presents with  . Medical Managment of Chronic Issues    foot swelling, finger locking, altered bowel movement, runny nose   No Known Allergies  HPI 80 y/o female patint with history of vascular and alzhimer's dementia is here with sister for follow up visit.  Her feet have been swollen with increase in amount of swelling towards end of the day. Her sister Stanton Kidney mentions that keeping the legs elevated helps with swelling. This has been going on for a month. Denies any open sores in her legs. No witnessed trauma  She has been having runny nose on and off for months, has clear nasal discharge. Sister denies any cough or sore throat, fever or chills  She has loose stool on and off, last 2 weeks back and imodium helps.   Her fingers in her hands have been locking as noticed by family  Family is also concerned about her poor po intake and weight loss  Patient denies any complaints  No recent behavior changes.    Review of Systems   Constitutional: Negative for fever, chills and appetite change.   HENT: Negative for sore throat, mouth sores, neck pain and sinus pressure.    Eyes: Negative for visual disturbance. corrective glasses used Respiratory: Negative for cough. Gets winded after walking small distance and this is going on for couple of months Cardiovascular: Negative for chest pain, palpitations and leg swelling.   Gastrointestinal: Negative for nausea, vomiting, abdominal pain and constipation.   Genitourinary: Negative for dysuria.   Musculoskeletal: Negative for joint swelling, arthralgias and gait problem.   No use of assisitive device No falls or trauma reported  Skin: Negative for rash.   Neurological: Negative for dizziness, weakness and light-headedness. Occassional tremors noted especially when anxious   Psychiatric/Behavioral: Negative for behavioral  problems, sleep disturbance and agitation. Lives with her two brothers. Her niece is the main caregiver  Past Medical History  Diagnosis Date  . Dementia   . Hyperlipidemia   . Lung mass   . COPD (chronic obstructive pulmonary disease)   . Senile dementia with delirium   . Urinary frequency   . Hemiplegia affecting dominant side, late effect of cerebrovascular disease   . Anxiety state, unspecified   . Chronic airway obstruction, not elsewhere classified   . Other malaise and fatigue   . Memory loss    Current Outpatient Prescriptions on File Prior to Visit  Medication Sig Dispense Refill  . aspirin 81 MG tablet Take 81 mg by mouth daily.      Marland Kitchen atorvastatin (LIPITOR) 10 MG tablet TAKE 1 TABLET EVERY DAY . MAKE AN APPOINTMENT.  30 tablet  0   No current facility-administered medications on file prior to visit.   Past Surgical History  Procedure Laterality Date  . Wedge resection rul nodule  01/10/2011    VAN TRIGT  . Mediastinal lymp node dissection  01/10/2011    VAN TRIGT  . Eye surgery      cataract   Family History  Problem Relation Age of Onset  . Heart disease Mother   . Cancer Father   . Heart disease Sister   . Heart disease Brother     Physical exam BP 118/66  Pulse 59  Temp(Src) 98 F (36.7 C) (Oral)  Wt 89 lb (40.37 kg)  SpO2 95%  General- elderly female in no acute  distress, thin built Head- atraumatic, normocephalic Eyes- PERRLA, EOMI, no pallor, no icterus, no discharge Neck- no lymphadenopathy, no thyromegaly, no jugular vein distension, no carotid bruit Nose- normal nasaal mucosa, no maxillary sinus tenderness, no frontal sinus tenderness Mouth- normal mucus membrane, no oral thrush, normal oropharynx Cardiovascular- normal s1,s2, no murmurs/ rubs/ gallops, normal distal pulses Respiratory- bilateral clear to auscultation, no wheeze, no rhonchi, no crackles Abdomen- bowel sounds present, soft, non tender Musculoskeletal- able to move all 4  extremities, edema 1+ in both feet and ankle, no open skin area, ROM of digits normal in office Neurological- no focal deficit Skin- warm and dry Psychiatry- alert and oriented to person only  Labs- Lab Results  Component Value Date   WBC 5.2 02/04/2013   HGB 13.9 02/04/2013   HCT 42.7 02/04/2013   MCV 89 02/04/2013   PLT 289 10/31/2010   CMP     Component Value Date/Time   NA 145* 02/04/2013 1207   NA 142 10/31/2010 1228   K 5.1 02/04/2013 1207   CL 107 02/04/2013 1207   CO2 26 02/04/2013 1207   GLUCOSE 98 02/04/2013 1207   GLUCOSE 83 10/31/2010 1228   BUN 14 02/04/2013 1207   BUN 16 12/17/2012 1329   CREATININE 0.82 02/04/2013 1207   CREATININE 0.92 12/17/2012 1329   CALCIUM 9.5 02/04/2013 1207   PROT 6.2 10/31/2010 1228   ALBUMIN 2.6* 10/31/2010 1228   AST 17 10/31/2010 1228   ALT 11 10/31/2010 1228   ALKPHOS 52 10/31/2010 1228   BILITOT 0.3 10/31/2010 1228   GFRNONAA 67 02/04/2013 1207   GFRAA 78 02/04/2013 1207   Assessment/plan  1. Edema Likely from venous stasis. No signs of chf on exam. Will have her on lasix 10 mg daily for now and keep legs elevated at rest. Monitor renal function - CBC with Differential - CMP - Lipid Panel  2. Allergic rhinitis Nasal saline spray daily for now and reassess  3. Alzheimer's dementia without behavioral disturbance With weight loss and gi upset, discontinue aricept. Will change namenda to 28 mg xr daily for now. Assistance with ADL to be provided. Talked about LTC option vs home health services for patient. Sister mentions, family deciding on this for now  4. Other and unspecified hyperlipidemia Continue her statin - Lipid Panel  5. Loss of weight aricept could be contributing some. Will stop this. Will also add remeron 7.5 mg daily to help stimulate appetite. Reassess. Check prealbumin level - Prealbumin

## 2013-07-28 NOTE — Patient Instructions (Addendum)
Use over the counter nasal saline spray one spray to each nostril daily  Stop taking your aricept (donepezil) and namenda  We have started you on namenda xr 28 mg daily for your memory  You have also been started on remeron 7.5 mg daily to help with your appetite  I have started you on fluid pill lasix for leg swelling

## 2013-07-29 LAB — COMPREHENSIVE METABOLIC PANEL
A/G RATIO: 1.5 (ref 1.1–2.5)
ALK PHOS: 63 IU/L (ref 39–117)
ALT: 19 IU/L (ref 0–32)
AST: 23 IU/L (ref 0–40)
Albumin: 3.8 g/dL (ref 3.5–4.7)
BILIRUBIN TOTAL: 0.3 mg/dL (ref 0.0–1.2)
BUN / CREAT RATIO: 21 (ref 11–26)
BUN: 17 mg/dL (ref 8–27)
CO2: 24 mmol/L (ref 18–29)
Calcium: 9.2 mg/dL (ref 8.7–10.3)
Chloride: 109 mmol/L — ABNORMAL HIGH (ref 97–108)
Creatinine, Ser: 0.82 mg/dL (ref 0.57–1.00)
GFR, EST AFRICAN AMERICAN: 77 mL/min/{1.73_m2} (ref >59)
GFR, EST NON AFRICAN AMERICAN: 67 mL/min/{1.73_m2} (ref >59)
Globulin, Total: 2.5 g/dL (ref 1.5–4.5)
Glucose: 96 mg/dL (ref 65–99)
POTASSIUM: 4.3 mmol/L (ref 3.5–5.2)
SODIUM: 148 mmol/L — AB (ref 134–144)
TOTAL PROTEIN: 6.3 g/dL (ref 6.0–8.5)

## 2013-07-29 LAB — LIPID PANEL
Chol/HDL Ratio: 2.2 ratio units (ref 0.0–4.4)
Cholesterol, Total: 162 mg/dL (ref 100–199)
HDL: 73 mg/dL (ref >39)
LDL Calculated: 75 mg/dL (ref 0–99)
Triglycerides: 68 mg/dL (ref 0–149)
VLDL CHOLESTEROL CAL: 14 mg/dL (ref 5–40)

## 2013-07-29 LAB — CBC WITH DIFFERENTIAL/PLATELET
BASOS ABS: 0 10*3/uL (ref 0.0–0.2)
Basos: 1 %
EOS ABS: 0.2 10*3/uL (ref 0.0–0.4)
Eos: 2 %
HEMATOCRIT: 40.2 % (ref 34.0–46.6)
HEMOGLOBIN: 13.3 g/dL (ref 11.1–15.9)
IMMATURE GRANULOCYTES: 0 %
Immature Grans (Abs): 0 10*3/uL (ref 0.0–0.1)
LYMPHS ABS: 3.4 10*3/uL — AB (ref 0.7–3.1)
Lymphs: 50 %
MCH: 29.2 pg (ref 26.6–33.0)
MCHC: 33.1 g/dL (ref 31.5–35.7)
MCV: 88 fL (ref 79–97)
MONOS ABS: 0.6 10*3/uL (ref 0.1–0.9)
Monocytes: 9 %
NEUTROS ABS: 2.6 10*3/uL (ref 1.4–7.0)
Neutrophils Relative %: 38 %
RBC: 4.56 x10E6/uL (ref 3.77–5.28)
RDW: 14.5 % (ref 12.3–15.4)
WBC: 6.9 10*3/uL (ref 3.4–10.8)

## 2013-07-29 LAB — PREALBUMIN: Prealbumin: 8 mg/dL — ABNORMAL LOW (ref 20–40)

## 2013-08-03 ENCOUNTER — Other Ambulatory Visit: Payer: Self-pay | Admitting: Internal Medicine

## 2013-08-06 ENCOUNTER — Other Ambulatory Visit: Payer: Self-pay | Admitting: Internal Medicine

## 2013-08-27 ENCOUNTER — Encounter: Payer: Self-pay | Admitting: Nurse Practitioner

## 2013-08-27 ENCOUNTER — Telehealth: Payer: Self-pay | Admitting: *Deleted

## 2013-08-27 ENCOUNTER — Ambulatory Visit (INDEPENDENT_AMBULATORY_CARE_PROVIDER_SITE_OTHER): Payer: Medicare Other | Admitting: Nurse Practitioner

## 2013-08-27 VITALS — BP 116/68 | HR 75 | Temp 97.4°F | Wt 90.0 lb

## 2013-08-27 DIAGNOSIS — K5900 Constipation, unspecified: Secondary | ICD-10-CM

## 2013-08-27 NOTE — Telephone Encounter (Signed)
Any nausea or vomiting with the loose stool? Appetite ok? encourage fluid intake and continue imodium for now. Make appointment for patient to be seen in the office

## 2013-08-27 NOTE — Telephone Encounter (Signed)
Spoke to [place] regarding pt having a constant diarrhea x 2 days.  They have given her 2-3 doses of Imodium with no relief and would like to know what else can be done or if they need an appointment. Please advise. thanks

## 2013-08-27 NOTE — Patient Instructions (Signed)
Just fleets enema today Use Miralax 1/2 packet mixed with full glass of fluid of choice 8 ounces daily for 1 week After miralax is complete use colace (stool softener) daily

## 2013-08-27 NOTE — Telephone Encounter (Signed)
Pt is not having any N&V only diarrhea.  She will come in for an appt today @ 2:30 pm.

## 2013-08-27 NOTE — Progress Notes (Signed)
Patient ID: Stacey Fernandez, female   DOB: 1929-06-03, 80 y.o.   MRN: [id number]    No Known Allergies  Chief Complaint  Patient presents with  . Acute Visit    diarrhea x 2 days with no N&V takin Imodium with little relief.    was having 2 weeks with no BM's prior  . other    wants ideas of meals that would be good for her.      HPI: Patient is a 80 y.o. female seen in the office today for diarrhea for 2 days. Sister reports that she has not had a BM in over 2 weeks. Family is taking care of her or trying to but need help. Looking into 24 hour care. Sister reports she is not having nausea or vomiting. Still eating and drinking but needs encouraged.  Pt nonverbal and history provided by sister  Review of Systems:  Unable to obtain due to dementia  Past Medical History  Diagnosis Date  . Dementia   . Hyperlipidemia   . Lung mass   . COPD (chronic obstructive pulmonary disease)   . Senile dementia with delirium   . Urinary frequency   . Hemiplegia affecting dominant side, late effect of cerebrovascular disease   . Anxiety state, unspecified   . Chronic airway obstruction, not elsewhere classified   . Other malaise and fatigue   . Memory loss    Past Surgical History  Procedure Laterality Date  . Wedge resection rul nodule  01/10/2011    VAN TRIGT  . Mediastinal lymp node dissection  01/10/2011    VAN TRIGT  . Eye surgery      cataract   Social History:   reports that she has quit smoking. She does not have any smokeless tobacco history on file. She reports that she does not drink alcohol or use illicit drugs.  Family History  Problem Relation Age of Onset  . Heart disease Mother   . Cancer Father   . Heart disease Sister   . Heart disease Brother     Medications: Patient's Medications  New Prescriptions   No medications on file  Previous Medications   ASPIRIN 81 MG TABLET    Take 81 mg by mouth daily.   ATORVASTATIN (LIPITOR) 10 MG TABLET    1 by mouth daily     BIOTIN 5 MG/ML LIQD    Take by mouth.   FUROSEMIDE (LASIX) 20 MG TABLET    Take 0.5 tablets (10 mg total) by mouth daily.   MEMANTINE HCL ER (NAMENDA XR) 28 MG CP24    Take 28 mg by mouth daily.   MIRTAZAPINE (REMERON) 15 MG TABLET    Take 0.5 tablets (7.5 mg total) by mouth at bedtime.   SODIUM CHLORIDE (OCEAN) 0.65 % SOLN NASAL SPRAY    Place 1 spray into both nostrils as needed for congestion.  Modified Medications   No medications on file  Discontinued Medications   ATORVASTATIN (LIPITOR) 10 MG TABLET    1 by mouth daily     Physical Exam:  Filed Vitals:   08/27/13 1455  BP: 116/68  Pulse: 75  Temp: 97.4 F (36.3 C)  TempSrc: Oral  Weight: 90 lb (40.824 kg)  SpO2: 99%    Physical Exam  Constitutional:  Thin female in NAD  HENT:  Mouth/Throat: Oropharynx is clear and moist. No oropharyngeal exudate.  Cardiovascular: Normal rate, regular rhythm and normal heart sounds.   Pulmonary/Chest: Effort normal and breath sounds normal.  No respiratory distress.  Abdominal: Normal appearance and bowel sounds are normal. She exhibits no distension (but tight). There is no tenderness. There is no rebound.  Genitourinary:  Rectal vault full of soft stool  Musculoskeletal: She exhibits no edema and no tenderness.  Neurological: She is alert.  Skin: Skin is warm and dry.    Labs reviewed: Basic Metabolic Panel:  Recent Labs  09/23/12 1027 12/17/12 1329 02/04/13 1207 07/28/13 1703  NA 144  --  145* 148*  K 5.0  --  5.1 4.3  CL 107  --  107 109*  CO2 25  --  26 24  GLUCOSE 84  --  98 96  BUN 18 16 14 17   CREATININE 0.93 0.92 0.82 0.82  CALCIUM 9.4  --  9.5 9.2   Liver Function Tests:  Recent Labs  07/28/13 1703  AST 23  ALT 19  ALKPHOS 63  BILITOT 0.3  PROT 6.3   No results found for this basename: LIPASE, AMYLASE,  in the last 8760 hours No results found for this basename: AMMONIA,  in the last 8760 hours CBC:  Recent Labs  02/04/13 1207 07/28/13 1703   WBC 5.2 6.9  NEUTROABS 2.3 2.6  HGB 13.9 13.3  HCT 42.7 40.2  MCV 89 88   Lipid Panel:  Recent Labs  07/28/13 1703  HDL 73  LDLCALC 75  TRIG 68  CHOLHDL 2.2   TSH: No results found for this basename: TSH,  in the last 8760 hours A1C: No components found with this basename: A1C,    Assessment/Plan 1. Constipation -with increased amount of soft stool in vault, positive BS and non-tender on exam -to give fleets enema today- family given instructions, with miralax 17 gm- 1/2 package daily and may decrease to every other day- for 1 week with increase water intake -after miralax treatment complete to start stool softener daily -pt has follow up with DR [facility] next week, to keep this appt  -okay to liberalize diet due to weight loss, increase fiber and fluids

## 2013-08-27 NOTE — Telephone Encounter (Signed)
lft message to rtn call @ [phone] [facility]) & [phone])

## 2013-09-08 ENCOUNTER — Ambulatory Visit (INDEPENDENT_AMBULATORY_CARE_PROVIDER_SITE_OTHER): Payer: Medicare Other | Admitting: Internal Medicine

## 2013-09-08 ENCOUNTER — Encounter: Payer: Self-pay | Admitting: Internal Medicine

## 2013-09-08 VITALS — BP 108/66 | HR 76 | Temp 97.1°F | Resp 10 | Wt 87.0 lb

## 2013-09-08 DIAGNOSIS — F0280 Dementia in other diseases classified elsewhere without behavioral disturbance: Secondary | ICD-10-CM

## 2013-09-08 DIAGNOSIS — K5904 Chronic idiopathic constipation: Secondary | ICD-10-CM | POA: Insufficient documentation

## 2013-09-08 DIAGNOSIS — E440 Moderate protein-calorie malnutrition: Secondary | ICD-10-CM

## 2013-09-08 DIAGNOSIS — G309 Alzheimer's disease, unspecified: Secondary | ICD-10-CM

## 2013-09-08 DIAGNOSIS — K5909 Other constipation: Secondary | ICD-10-CM

## 2013-09-08 MED ORDER — POLYETHYLENE GLYCOL 3350 17 GM/SCOOP PO POWD: 17.0000 g | Freq: Every day | ORAL | Status: DC

## 2013-09-08 MED ORDER — DOCUSATE SODIUM 283 MG RE ENEM: 1.0000 | ENEMA | Freq: Every day | RECTAL | Status: DC | PRN

## 2013-09-08 MED ORDER — DOCUSATE SODIUM 100 MG PO CAPS: 100.0000 mg | ORAL_CAPSULE | Freq: Two times a day (BID) | ORAL | Status: DC

## 2013-09-08 NOTE — Progress Notes (Signed)
Patient ID: Stacey Fernandez, female   DOB: 18-Jul-1929, 80 y.o.   MRN: [id number]    Chief Complaint  Patient presents with  . Follow-up    bowel movement, memory, placement   No Known Allergies  HPI 80 y/o female patient with progressive dementia is here with her sister and brother. Her memory has further declined as per family and they have concerns about being able to assist with her activities at home. The sister and brother who are here this visit live nearby but patient lives with 2 other brothers- one has been diagnosed with cancer recently and other has his own health concerns and is also my clinic patient.  She has been constipated and has had 2 bowel movement- one only little smear and other in small pieces 3 days back as per family. She denies any nausea or vomiting or abdominal pain. Oral intake is poor. Have reviewed her prealbumin level which is low. She is taking ensure and continues to lose weight.   ROS No fever or chills No falls reported Able to feed herself but sometimes needs assistance with feeding and changing her clothes No agitation or acute behavioral change reported  Past Medical History  Diagnosis Date  . Dementia   . Hyperlipidemia   . Lung mass   . COPD (chronic obstructive pulmonary disease)   . Senile dementia with delirium   . Urinary frequency   . Hemiplegia affecting dominant side, late effect of cerebrovascular disease   . Anxiety state, unspecified   . Chronic airway obstruction, not elsewhere classified   . Other malaise and fatigue   . Memory loss    Medication reviewed. See [facility]   Physical exam BP 108/66  Pulse 76  Temp(Src) 97.1 F (36.2 C) (Oral)  Resp 10  Wt 87 lb (39.463 kg)  General- elderly female in no acute distress, thin built Head- atraumatic, normocephalic Eyes- PERRLA, EOMI, no pallor, no icterus, no discharge Neck- no lymphadenopathy, no thyromegaly, no jugular vein distension, no carotid bruit Nose- normal nasaal  mucosa, no maxillary sinus tenderness, no frontal sinus tenderness Mouth- normal mucus membrane, no oral thrush, normal oropharynx Cardiovascular- normal s1,s2, no murmurs/ rubs/ gallops, normal distal pulses Respiratory- bilateral clear to auscultation, no wheeze, no rhonchi, no crackles Abdomen- bowel sounds present, soft, non tender Musculoskeletal- able to move all 4 extremities, edema 1+ in both feet and ankle, no open skin area, ROM of digits normal in office Neurological- no focal deficit Skin- warm and dry Psychiatry- alert and oriented to person only   Labs- MMSE today 5/30 and failed clock drawing  Assessment/plan  1. Constipation - functional Will change her colace to 100 mg bid and add miralax 17 g daily- hold for loose stool. Also will add docusate enema every 3 days if needed.   2. Alzheimer's dementia without behavioral disturbance Has been having slow decline. Continue namenda xr, off aricept since i saw her last with gi issues. i will continue namenda as it is helping alleviate some symptoms. Spoke with family about options with SNF and memory care unit, provided list of facilities with contact info. Family emotional, counselling provided, answered their questions. Once facility is decided, will fill out FL2 form  3. Protein-calorie malnutrition, moderate Continue protein supplement and mirtazapine for now. If no weight improvement on next visit, will d/c mirtazapine. Decline anticipated- explained about need for skin care and fall precautions.

## 2013-09-08 NOTE — Progress Notes (Signed)
Failed Clock Drawing---Given by Rodena Piety Jashay Roddy,CMA

## 2013-10-21 ENCOUNTER — Encounter: Payer: Self-pay | Admitting: Internal Medicine

## 2013-11-03 ENCOUNTER — Other Ambulatory Visit: Payer: Self-pay | Admitting: Internal Medicine

## 2013-11-23 ENCOUNTER — Telehealth: Payer: Self-pay | Admitting: Internal Medicine

## 2013-11-23 ENCOUNTER — Other Ambulatory Visit: Payer: Self-pay | Admitting: Internal Medicine

## 2013-11-23 NOTE — Telephone Encounter (Signed)
Filled out what I could with the [place] and placed them in folder on Ledge for Dr. Bubba Camp to complete.

## 2013-11-23 NOTE — Telephone Encounter (Signed)
Received [employer] life long term care paperwork from brother & POA Ferd Hibbs.. Put in MA's tray

## 2013-11-27 ENCOUNTER — Other Ambulatory Visit: Payer: Self-pay | Admitting: *Deleted

## 2013-11-27 DIAGNOSIS — C3490 Malignant neoplasm of unspecified part of unspecified bronchus or lung: Secondary | ICD-10-CM

## 2013-11-28 ENCOUNTER — Other Ambulatory Visit: Payer: Self-pay | Admitting: Internal Medicine

## 2013-12-01 ENCOUNTER — Ambulatory Visit (INDEPENDENT_AMBULATORY_CARE_PROVIDER_SITE_OTHER): Payer: Medicare Other | Admitting: Internal Medicine

## 2013-12-01 ENCOUNTER — Encounter: Payer: Self-pay | Admitting: Internal Medicine

## 2013-12-01 VITALS — BP 116/60 | HR 75 | Temp 96.9°F | Wt 84.6 lb

## 2013-12-01 DIAGNOSIS — K5909 Other constipation: Secondary | ICD-10-CM

## 2013-12-01 DIAGNOSIS — Z23 Encounter for immunization: Secondary | ICD-10-CM

## 2013-12-01 DIAGNOSIS — F0280 Dementia in other diseases classified elsewhere without behavioral disturbance: Secondary | ICD-10-CM | POA: Insufficient documentation

## 2013-12-01 DIAGNOSIS — F0150 Vascular dementia without behavioral disturbance: Secondary | ICD-10-CM

## 2013-12-01 DIAGNOSIS — G309 Alzheimer's disease, unspecified: Principal | ICD-10-CM

## 2013-12-01 DIAGNOSIS — L8991 Pressure ulcer of unspecified site, stage 1: Secondary | ICD-10-CM

## 2013-12-01 DIAGNOSIS — E440 Moderate protein-calorie malnutrition: Secondary | ICD-10-CM

## 2013-12-01 DIAGNOSIS — L602 Onychogryphosis: Secondary | ICD-10-CM

## 2013-12-01 DIAGNOSIS — L608 Other nail disorders: Secondary | ICD-10-CM

## 2013-12-01 DIAGNOSIS — E785 Hyperlipidemia, unspecified: Secondary | ICD-10-CM

## 2013-12-01 DIAGNOSIS — L8990 Pressure ulcer of unspecified site, unspecified stage: Secondary | ICD-10-CM

## 2013-12-01 DIAGNOSIS — I10 Essential (primary) hypertension: Secondary | ICD-10-CM

## 2013-12-01 MED ORDER — TETANUS-DIPHTH-ACELL PERTUSSIS 5-2-15.5 LF-MCG/0.5 IM SUSP: 0.5000 mL | Freq: Once | INTRAMUSCULAR | Status: DC

## 2013-12-01 MED ORDER — ZOSTER VACCINE LIVE 19400 UNT/0.65ML ~~LOC~~ SOLR: 0.6500 mL | Freq: Once | SUBCUTANEOUS | Status: DC

## 2013-12-01 MED ORDER — LATANOPROST 0.005 % OP SOLN: 1.0000 [drp] | Freq: Every day | OPHTHALMIC | Status: DC

## 2013-12-01 NOTE — Progress Notes (Signed)
Patient ID: Stacey Fernandez, female   DOB: 1929-05-09, 80 y.o.   MRN: [id number]    Chief Complaint  Patient presents with  . Follow-up    long-term forms to be filled out  . other    LT big toe nail is coming off and has a spot on the tailbone that wants checked out.   No Known Allergies  HPI 80 y/o female pt here with her brother and caregiver. She needs a form filled out for long term home health care. Her family is concerned about a spot on her buttock. Pt denies any pain. Family denies open sore. Family have also noticed her left great toe nail to be falling off and would like that addressed. Pt denies any foot or toe pain. No witnessed trauma Has been complaint with her medications. No acute behavioral changes Needs assistance- hands on with toileting, bathing, dressing and stand by for feeding and transfer. No falls reported.  Review of Systems  Constitutional: Negative for fever, chills, diaphoresis.  HENT: Negative for congestion, sore throat.   Eyes: Negative for blurred vision  Respiratory: Negative for cough, sputum production, shortness of breath and wheezing.   Cardiovascular: Negative for chest pain, palpitations, orthopnea and leg swelling.  Gastrointestinal: Negative for heartburn, nausea, vomiting, abdominal pain Musculoskeletal: Negative for back pain, falls Skin: Negative for itching and rash.  Neurological: Negative for dizziness, tingling, focal weakness and headaches.  Psychiatric/Behavioral:The patient is not nervous/anxious.    Past Medical History  Diagnosis Date  . Dementia   . Hyperlipidemia   . Lung mass   . COPD (chronic obstructive pulmonary disease)   . Senile dementia with delirium   . Urinary frequency   . Hemiplegia affecting dominant side, late effect of cerebrovascular disease   . Anxiety state, unspecified   . Chronic airway obstruction, not elsewhere classified   . Other malaise and fatigue   . Memory loss    Past Surgical History    Procedure Laterality Date  . Wedge resection rul nodule  01/10/2011    VAN TRIGT  . Mediastinal lymp node dissection  01/10/2011    VAN TRIGT  . Eye surgery      cataract   Current Outpatient Prescriptions on File Prior to Visit  Medication Sig Dispense Refill  . aspirin 81 MG tablet Take 81 mg by mouth daily.      Marland Kitchen atorvastatin (LIPITOR) 10 MG tablet 1 by mouth daily  30 tablet  5  . Biotin 5 MG/ML LIQD Take by mouth.      . docusate sodium (COLACE) 100 MG capsule Take 1 capsule (100 mg total) by mouth 2 (two) times daily.  60 capsule  3  . docusate sodium (ENEMEEZ) 283 MG enema Place 1 enema (283 mg total) rectally daily as needed for severe constipation. Every 3 days if no bowel movement for 3 days  30 each  0  . furosemide (LASIX) 20 MG tablet Take 0.5 tablets (10 mg total) by mouth daily.  30 tablet  3  . mirtazapine (REMERON) 15 MG tablet TAKE 1/2 TABLET BY MOUTH AT BEDTIME.  15 tablet  1  . NAMENDA XR 28 MG CP24 TAKE ONE CAPSULE EVERY DAY  30 capsule  3  . polyethylene glycol powder (GLYCOLAX/MIRALAX) powder TAKE 17 G BY MOUTH DAILY.  527 g  5  . sodium chloride (OCEAN) 0.65 % SOLN nasal spray Place 1 spray into both nostrils as needed for congestion.  1 Bottle  0  No current facility-administered medications on file prior to visit.   Physical exam BP 116/60  Pulse 75  Temp(Src) 96.9 F (36.1 C) (Oral)  Wt 84 lb 9.6 oz (38.374 kg)  SpO2 97%  General- elderly female in no acute distress Head- atraumatic, normocephalic Eyes- no pallor, no icterus, no discharge Neck- no lymphadenopathy Cardiovascular- normal s1,s2, no murmurs Respiratory- bilateral clear to auscultation, no wheeze, no rhonchi, no crackles Abdomen- bowel sounds present, soft, non tender Musculoskeletal- able to move all 4 extremities, no leg edema, has hypertrophied toe nails and left great toe nail has seerated from nail bed, non tender, no signs of infection Neurological- no focal deficit, alert and  oriented to person Skin- warm and dry, has blanchable healed skin area on sacrum area Psychiatry- normal mood and affect  Assessment/plan  1. Need for prophylactic vaccination with combined diphtheria-tetanus-pertussis (DTP) vaccine - Tdap (ADACEL) 08-15-13.5 LF-MCG/0.5 injection; Inject 0.5 mLs into the muscle once.  Dispense: 0.5 mL; Refill: 0  2. Need for prophylactic vaccination and inoculation against other viral diseases(V04.89) - zoster vaccine live, PF, (ZOSTAVAX) 44315 UNT/0.65ML injection; Inject 19,400 Units into the skin once.  Dispense: 1 each; Refill: 0  3. Mixed Alzheimer's and vascular dementia Progressively declining, behavior normal. Needs more assistance with her ADLs. Continue namenda, aspirin, fall precautions, skin care. Form filled out for long term care requsition to have home health care services  4. Other and unspecified hyperlipidemia Continue statin  5. Other constipation Stable. Continue current regimen  6. Protein-calorie malnutrition, moderate Continues to have weight loss, continue remeron, encourage po intake and protein supplements  7. Essential hypertension Stable, continue lasix, monitor kidney function  8. Nail hypertrophy Dead nail seperating from nail bed and hypertrophied toe nails. Will provide podiatry referral for toe nail removal - Ambulatory referral to Podiatry  9. Pressure ulcer, stage 1 Has resolved, well healed and blanchable skin area now. Continue pressure ulcer prophylaxis like frequent position change in bed and wedge cushion when on chair

## 2013-12-27 ENCOUNTER — Other Ambulatory Visit: Payer: Self-pay | Admitting: Internal Medicine

## 2013-12-28 ENCOUNTER — Other Ambulatory Visit: Payer: Self-pay | Admitting: Internal Medicine

## 2014-01-13 ENCOUNTER — Other Ambulatory Visit: Payer: Self-pay | Admitting: *Deleted

## 2014-01-13 ENCOUNTER — Inpatient Hospital Stay: Admission: RE | Admit: 2014-01-13 | Payer: Medicare Other | Source: Ambulatory Visit

## 2014-01-13 ENCOUNTER — Ambulatory Visit: Payer: Medicare Other | Admitting: Cardiothoracic Surgery

## 2014-01-13 DIAGNOSIS — C3490 Malignant neoplasm of unspecified part of unspecified bronchus or lung: Secondary | ICD-10-CM

## 2014-01-13 LAB — BUN: BUN: 20 mg/dL (ref 6–23)

## 2014-01-13 LAB — CREATININE, SERUM: Creat: 0.9 mg/dL (ref 0.50–1.10)

## 2014-01-20 ENCOUNTER — Encounter: Payer: Self-pay | Admitting: Cardiothoracic Surgery

## 2014-01-20 ENCOUNTER — Ambulatory Visit
Admission: RE | Admit: 2014-01-20 | Discharge: 2014-01-20 | Disposition: A | Discharge status: Home / Self Care | Payer: Medicare Other | Source: Ambulatory Visit | Attending: Cardiothoracic Surgery | Admitting: Cardiothoracic Surgery

## 2014-01-20 ENCOUNTER — Ambulatory Visit (INDEPENDENT_AMBULATORY_CARE_PROVIDER_SITE_OTHER): Payer: Medicare Other | Admitting: Cardiothoracic Surgery

## 2014-01-20 VITALS — BP 146/84 | HR 77 | Ht 60.0 in | Wt 84.0 lb

## 2014-01-20 DIAGNOSIS — Z85118 Personal history of other malignant neoplasm of bronchus and lung: Secondary | ICD-10-CM

## 2014-01-20 DIAGNOSIS — C3490 Malignant neoplasm of unspecified part of unspecified bronchus or lung: Secondary | ICD-10-CM

## 2014-01-20 MED ORDER — IOHEXOL 300 MG/ML  SOLN
75.0000 mL | Freq: Once | INTRAMUSCULAR | Status: AC | PRN
  Administered 2014-01-20: 75 mL via INTRAVENOUS

## 2014-01-20 NOTE — Progress Notes (Signed)
PCP is PROVIDER NOT IN SYSTEM Referring Provider is Rip Harbour, [place] *  Chief Complaint  Patient presents with  . F/U THORACIC    1 YR F/U CT CHEST    HPI: 4 year followup after undergoing right VATS and wedge resection of right lower lobe non-small cell carcinoma in 2011. Serial CT scans have been negative. The patient did not require postoperative  adjunctive chemotherapy. She has no pulmonary complaints. Unfortunately she has developed significant dementia and now has a IT trainer. Fortunately she has had no falls and her general health otherwise has been good. She is coming by her daughter  CT scan today is clear without evidence of recurrence or adenopathy. It 4 years postop and this 80 year old female with dementia we will consider her cured and stop further surveillance scans. Past Medical History  Diagnosis Date  . Dementia   . Hyperlipidemia   . Lung mass   . COPD (chronic obstructive pulmonary disease)   . Senile dementia with delirium   . Urinary frequency   . Hemiplegia affecting dominant side, late effect of cerebrovascular disease   . Anxiety state, unspecified   . Chronic airway obstruction, not elsewhere classified   . Other malaise and fatigue   . Memory loss     Past Surgical History  Procedure Laterality Date  . Wedge resection rul nodule  01/10/2011    VAN TRIGT  . Mediastinal lymp node dissection  01/10/2011    VAN TRIGT  . Eye surgery      cataract    Family History  Problem Relation Age of Onset  . Heart disease Mother   . Cancer Father   . Heart disease Sister   . Heart disease Brother     Social History History  Substance Use Topics  . Smoking status: Former Research scientist (life sciences)  . Smokeless tobacco: Not on file  . Alcohol Use: No    Current Outpatient Prescriptions  Medication Sig Dispense Refill  . aspirin 81 MG tablet Take 81 mg by mouth daily.      Marland Kitchen atorvastatin (LIPITOR) 10 MG tablet 1 by mouth daily  30 tablet  5  . Biotin 5 MG/ML LIQD  Take by mouth.      . docusate sodium (COLACE) 100 MG capsule TAKE 1 CAPSULE (100 MG TOTAL) BY MOUTH 2 (TWO) TIMES DAILY.  60 capsule  5  . docusate sodium (ENEMEEZ) 283 MG enema Place 1 enema (283 mg total) rectally daily as needed for severe constipation. Every 3 days if no bowel movement for 3 days  30 each  0  . furosemide (LASIX) 20 MG tablet TAKE 0.5 TABLETS (10 MG TOTAL) BY MOUTH DAILY.  45 tablet  1  . latanoprost (XALATAN) 0.005 % ophthalmic solution Place 1 drop into both eyes at bedtime.  2.5 mL  2  . mirtazapine (REMERON) 15 MG tablet TAKE 1/2 TABLET BY MOUTH AT BEDTIME.  15 tablet  1  . NAMENDA XR 28 MG CP24 TAKE ONE CAPSULE EVERY DAY  30 capsule  3  . polyethylene glycol powder (GLYCOLAX/MIRALAX) powder TAKE 17 G BY MOUTH DAILY.  527 g  5  . sodium chloride (OCEAN) 0.65 % SOLN nasal spray Place 1 spray into both nostrils as needed for congestion.  1 Bottle  0  . Tdap (ADACEL) 08-15-13.5 LF-MCG/0.5 injection Inject 0.5 mLs into the muscle once.  0.5 mL  0  . zoster vaccine live, PF, (ZOSTAVAX) 27035 UNT/0.65ML injection Inject 19,400 Units into the skin once.  1 each  0   No current facility-administered medications for this visit.    No Known Allergies  Review of Systems appetite has been good weight is slightly down, no shortness of breath  BP 146/84  Pulse 77  Ht 5' (1.524 m)  Wt 84 lb (38.102 kg)  BMI 16.41 kg/m2  SpO2 94% Physical Exam Alert and responsive but not fully oriented Lungs clear Heart rhythm regular No focal motor deficit  Diagnostic Tests: CT scan 4 years postresection shows no evidence recurrence-patient is determined as cured from non-small cell carcinoma of the  lung  Impression  Previously resected non-small cell carcinoma right lower lobe now cured.  No need for further surveillance CT scans. Patient return as needed.

## 2014-01-25 ENCOUNTER — Other Ambulatory Visit: Payer: Self-pay | Admitting: *Deleted

## 2014-01-25 MED ORDER — MIRTAZAPINE 15 MG PO TABS: ORAL_TABLET | ORAL | Status: DC

## 2014-01-25 NOTE — Telephone Encounter (Signed)
[place]

## 2014-02-13 ENCOUNTER — Other Ambulatory Visit: Payer: Self-pay | Admitting: Internal Medicine

## 2014-03-03 ENCOUNTER — Encounter: Payer: Self-pay | Admitting: Internal Medicine

## 2014-03-03 ENCOUNTER — Ambulatory Visit (INDEPENDENT_AMBULATORY_CARE_PROVIDER_SITE_OTHER): Payer: Medicare Other | Admitting: Internal Medicine

## 2014-03-03 VITALS — BP 100/60 | HR 86 | Temp 99.2°F | Ht 60.0 in | Wt 89.8 lb

## 2014-03-03 DIAGNOSIS — F0280 Dementia in other diseases classified elsewhere without behavioral disturbance: Secondary | ICD-10-CM

## 2014-03-03 DIAGNOSIS — I10 Essential (primary) hypertension: Secondary | ICD-10-CM

## 2014-03-03 DIAGNOSIS — G309 Alzheimer's disease, unspecified: Secondary | ICD-10-CM

## 2014-03-03 DIAGNOSIS — K5904 Chronic idiopathic constipation: Secondary | ICD-10-CM

## 2014-03-03 DIAGNOSIS — E785 Hyperlipidemia, unspecified: Secondary | ICD-10-CM

## 2014-03-03 DIAGNOSIS — F0150 Vascular dementia without behavioral disturbance: Secondary | ICD-10-CM

## 2014-03-03 DIAGNOSIS — E440 Moderate protein-calorie malnutrition: Secondary | ICD-10-CM

## 2014-03-03 DIAGNOSIS — K5909 Other constipation: Secondary | ICD-10-CM

## 2014-03-03 NOTE — Progress Notes (Signed)
Patient ID: Stacey Fernandez, female   DOB: Jun 02, 1929, 80 y.o.   MRN: [id number]    Chief Complaint  Patient presents with  . Follow-up    3 month follow up   No Known Allergies  HPI 80 y/o female pt here with her caregiver Blaine Hamper who is with her 7 days a week for 4 hours. She helps her with her med administration, meal preparation, toileting, shower. She can feed herself with some assistance from caregiver and brother.  As per caregiver, her right hand fingers tend to lock at the joints. her caregiver has noticed her to be more quietr and more confused during am and more interactive in the evening. No acute behavioral changes noted. No skin concerns reported. No falls reported. Has been complaint with her medications. Needs assistance- hands on with toileting, bathing, dressing and stand by for feeding and transfer.   Review of Systems  Constitutional: Negative for fever, chills, diaphoresis.  HENT: Negative for congestion, sore throat.   Eyes: Negative for blurred vision. Wears glasses Respiratory: Negative for cough, sputum production, shortness of breath and wheezing.   Cardiovascular: Negative for chest pain, palpitations, orthopnea and leg swelling.  Gastrointestinal: Negative for heartburn, nausea, vomiting, abdominal pain. Has a bowel movement every other day. No urinary complaints. Wears a pad all the time Musculoskeletal: Negative for back pain, falls Skin: Negative for itching and rash.  Neurological: Negative for dizziness, tingling, headaches.  Psychiatric/Behavioral:The patient is not nervous/anxious.    Past Medical History  Diagnosis Date  . Dementia   . Hyperlipidemia   . Lung mass   . COPD (chronic obstructive pulmonary disease)   . Senile dementia with delirium   . Urinary frequency   . Hemiplegia affecting dominant side, late effect of cerebrovascular disease   . Anxiety state, unspecified   . Chronic airway obstruction, not elsewhere classified   .  Other malaise and fatigue   . Memory loss    Current Outpatient Prescriptions on File Prior to Visit  Medication Sig Dispense Refill  . aspirin 81 MG tablet Take 81 mg by mouth daily.    Marland Kitchen atorvastatin (LIPITOR) 10 MG tablet 1 by mouth daily 30 tablet 5  . Biotin 5 MG/ML LIQD Take by mouth.    . docusate sodium (COLACE) 100 MG capsule TAKE 1 CAPSULE (100 MG TOTAL) BY MOUTH 2 (TWO) TIMES DAILY. 60 capsule 5  . docusate sodium (ENEMEEZ) 283 MG enema Place 1 enema (283 mg total) rectally daily as needed for severe constipation. Every 3 days if no bowel movement for 3 days 30 each 0  . furosemide (LASIX) 20 MG tablet TAKE 0.5 TABLETS (10 MG TOTAL) BY MOUTH DAILY. 45 tablet 1  . latanoprost (XALATAN) 0.005 % ophthalmic solution PLACE 1 DROP INTO BOTH EYES AT BEDTIME. 2.5 mL 2  . mirtazapine (REMERON) 15 MG tablet Take 1/2 tablet by mouth at bedtime 15 tablet 1  . NAMENDA XR 28 MG CP24 TAKE ONE CAPSULE EVERY DAY 30 capsule 3  . polyethylene glycol powder (GLYCOLAX/MIRALAX) powder TAKE 17 G BY MOUTH DAILY. 527 g 5  . sodium chloride (OCEAN) 0.65 % SOLN nasal spray Place 1 spray into both nostrils as needed for congestion. 1 Bottle 0  . Tdap (ADACEL) 08-15-13.5 LF-MCG/0.5 injection Inject 0.5 mLs into the muscle once. 0.5 mL 0  . zoster vaccine live, PF, (ZOSTAVAX) 76195 UNT/0.65ML injection Inject 19,400 Units into the skin once. 1 each 0   No current facility-administered medications on file  prior to visit.    PHYSICAL EXAM BP 100/60 mmHg  Pulse 86  Temp(Src) 99.2 F (37.3 C) (Oral)  Ht 5' (1.524 m)  Wt 89 lb 12.8 oz (40.733 kg)  BMI 17.54 kg/m2  SpO2 98%  General- elderly female in no acute distress Head- atraumatic, normocephalic Eyes- no pallor, no icterus, no discharge Neck- no lymphadenopathy Cardiovascular- normal s1,s2, no murmurs Respiratory- bilateral clear to auscultation, no wheeze, no rhonchi, no crackles Abdomen- bowel sounds present, soft, non tender Musculoskeletal- able  to move all 4 extremities, no leg edema, trimmed toe nails, left great toe nail close to falling off, no signs of infection Neurological- no focal deficit, alert and oriented to person Skin- warm and dry Psychiatry- normal mood and affect  Labs CBC Latest Ref Rng 07/28/2013 02/04/2013 10/31/2010  WBC 3.4 - 10.8 x10E3/uL 6.9 5.2 8.0  Hemoglobin 11.1 - 15.9 g/dL 13.3 13.9 11.9(L)  Hematocrit 34.0 - 46.6 % 40.2 42.7 35.8(L)  Platelets 150 - 400 K/uL - - 289   CMP     Component Value Date/Time   NA 148* 07/28/2013 1703   NA 142 10/31/2010 1228   K 4.3 07/28/2013 1703   CL 109* 07/28/2013 1703   CO2 24 07/28/2013 1703   GLUCOSE 96 07/28/2013 1703   GLUCOSE 83 10/31/2010 1228   BUN 20 01/13/2014 1305   BUN 17 07/28/2013 1703   CREATININE 0.90 01/13/2014 1157   CREATININE 0.82 07/28/2013 1703   CALCIUM 9.2 07/28/2013 1703   PROT 6.3 07/28/2013 1703   PROT 6.2 10/31/2010 1228   ALBUMIN 2.6* 10/31/2010 1228   AST 23 07/28/2013 1703   ALT 19 07/28/2013 1703   ALKPHOS 63 07/28/2013 1703   BILITOT 0.3 07/28/2013 1703   GFRNONAA 67 07/28/2013 1703   GFRAA 77 07/28/2013 1703   Lipid Panel     Component Value Date/Time   TRIG 68 07/28/2013 1703   HDL 73 07/28/2013 1703   CHOLHDL 2.2 07/28/2013 1703   LDLCALC 75 07/28/2013 1703    Wt Readings from Last 3 Encounters:  03/03/14 89 lb 12.8 oz (40.733 kg)  01/20/14 84 lb (38.102 kg)  12/01/13 84 lb 9.6 oz (38.374 kg)   Assessment/plan  1. Mixed Alzheimer's and vascular dementia Stable, underweight, decline anticipated. Continue skin care and fall precautions. Continue namenda xr for now. Continue remeron as she has been able to gain some weight and appetite is good - Prealbumin  2. Hyperlipidemia Continue lipitor 10 mg daily and monitor - CMP - Lipid Panel - Prealbumin  3. Essential hypertension Stable, infact on lower side. No leg swelling, will discontinue her lasix  4. Constipation - functional contnue colace bid for  now, check thyroid function - TSH  5. Protein-calorie malnutrition, moderate Persists, has gained some weight. Continue her ensure supplement. - CBC with Differential - TSH - Vitamin D, 1,25-dihydroxy - Prealbumin

## 2014-03-03 NOTE — Patient Instructions (Signed)
Stop taking lasix for now Check blood pressure daily at home for a week and if BP reading is greater than 140/90, please let us know

## 2014-03-04 LAB — COMPREHENSIVE METABOLIC PANEL
A/G RATIO: 1.5 (ref 1.1–2.5)
ALT: 11 IU/L (ref 0–32)
AST: 23 IU/L (ref 0–40)
Albumin: 3.9 g/dL (ref 3.5–4.7)
Alkaline Phosphatase: 67 IU/L (ref 39–117)
BUN/Creatinine Ratio: 18 (ref 11–26)
BUN: 19 mg/dL (ref 8–27)
CO2: 25 mmol/L (ref 18–29)
Calcium: 8.8 mg/dL (ref 8.7–10.3)
Chloride: 108 mmol/L (ref 97–108)
Creatinine, Ser: 1.06 mg/dL — ABNORMAL HIGH (ref 0.57–1.00)
GFR calc Af Amer: 57 mL/min/{1.73_m2} — ABNORMAL LOW (ref >59)
GFR, EST NON AFRICAN AMERICAN: 49 mL/min/{1.73_m2} — AB (ref >59)
GLUCOSE: 85 mg/dL (ref 65–99)
Globulin, Total: 2.6 g/dL (ref 1.5–4.5)
Potassium: 4.3 mmol/L (ref 3.5–5.2)
Sodium: 147 mmol/L — ABNORMAL HIGH (ref 134–144)
TOTAL PROTEIN: 6.5 g/dL (ref 6.0–8.5)
Total Bilirubin: <0.2 mg/dL (ref 0.0–1.2)

## 2014-03-04 LAB — CBC WITH DIFFERENTIAL/PLATELET
Basophils Absolute: 0.1 10*3/uL (ref 0.0–0.2)
Basos: 1 %
Eos: 3 %
Eosinophils Absolute: 0.2 10*3/uL (ref 0.0–0.4)
HCT: 39.4 % (ref 34.0–46.6)
Hemoglobin: 13 g/dL (ref 11.1–15.9)
Immature Grans (Abs): 0 10*3/uL (ref 0.0–0.1)
Immature Granulocytes: 0 %
LYMPHS ABS: 2.9 10*3/uL (ref 0.7–3.1)
Lymphs: 58 %
MCH: 29.3 pg (ref 26.6–33.0)
MCHC: 33 g/dL (ref 31.5–35.7)
MCV: 89 fL (ref 79–97)
MONOS ABS: 0.5 10*3/uL (ref 0.1–0.9)
Monocytes: 9 %
Neutrophils Absolute: 1.5 10*3/uL (ref 1.4–7.0)
Neutrophils Relative %: 29 %
RBC: 4.44 x10E6/uL (ref 3.77–5.28)
RDW: 14.1 % (ref 12.3–15.4)
WBC: 5.1 10*3/uL (ref 3.4–10.8)

## 2014-03-04 LAB — LIPID PANEL
CHOLESTEROL TOTAL: 186 mg/dL (ref 100–199)
Chol/HDL Ratio: 2.9 ratio units (ref 0.0–4.4)
HDL: 65 mg/dL (ref >39)
LDL Calculated: 108 mg/dL — ABNORMAL HIGH (ref 0–99)
Triglycerides: 63 mg/dL (ref 0–149)
VLDL CHOLESTEROL CAL: 13 mg/dL (ref 5–40)

## 2014-03-04 LAB — PREALBUMIN: Prealbumin: 9 mg/dL — ABNORMAL LOW (ref 20–40)

## 2014-03-04 LAB — TSH: TSH: 1.15 u[IU]/mL (ref 0.450–4.500)

## 2014-03-04 LAB — VITAMIN D 1,25 DIHYDROXY: Vit D, 1,25-Dihydroxy: 34.8 pg/mL (ref 19.9–79.3)

## 2014-03-19 ENCOUNTER — Other Ambulatory Visit: Payer: Self-pay | Admitting: Internal Medicine

## 2014-03-25 ENCOUNTER — Other Ambulatory Visit: Payer: Self-pay | Admitting: *Deleted

## 2014-03-25 MED ORDER — MIRTAZAPINE 15 MG PO TABS: ORAL_TABLET | ORAL | Status: DC

## 2014-03-25 NOTE — Telephone Encounter (Signed)
Leda Gauze called requesting refill to be faxed to pharmacy

## 2014-03-27 ENCOUNTER — Other Ambulatory Visit: Payer: Self-pay | Admitting: Internal Medicine

## 2014-03-29 ENCOUNTER — Other Ambulatory Visit: Payer: Self-pay | Admitting: *Deleted

## 2014-03-29 MED ORDER — MIRTAZAPINE 15 MG PO TABS: ORAL_TABLET | ORAL | Status: DC

## 2014-03-29 NOTE — Telephone Encounter (Signed)
Stacey Fernandez

## 2014-05-11 ENCOUNTER — Telehealth: Payer: Self-pay | Admitting: *Deleted

## 2014-05-11 NOTE — Telephone Encounter (Signed)
POA, Nelwyn Salisbury called and requested the first visit patient was diagnosed with Alzheimer's. I spoke with Caren Griffins and she stated that I could mail this to patient's address. Lattie Haw printed the visit 12/3011 out of old system and I mailed.

## 2014-06-04 DIAGNOSIS — Z0289 Encounter for other administrative examinations: Secondary | ICD-10-CM

## 2014-06-08 ENCOUNTER — Encounter: Payer: Self-pay | Admitting: Internal Medicine

## 2014-06-23 ENCOUNTER — Other Ambulatory Visit: Payer: Self-pay | Admitting: Internal Medicine

## 2014-07-20 ENCOUNTER — Other Ambulatory Visit: Payer: Self-pay | Admitting: Internal Medicine

## 2014-08-01 ENCOUNTER — Other Ambulatory Visit: Payer: Self-pay | Admitting: Internal Medicine

## 2014-09-07 ENCOUNTER — Ambulatory Visit: Payer: BC Managed Care – PPO | Admitting: Internal Medicine

## 2014-09-09 ENCOUNTER — Ambulatory Visit (INDEPENDENT_AMBULATORY_CARE_PROVIDER_SITE_OTHER): Payer: BC Managed Care – PPO | Admitting: Nurse Practitioner

## 2014-09-09 ENCOUNTER — Encounter: Payer: Self-pay | Admitting: Nurse Practitioner

## 2014-09-09 VITALS — BP 120/72 | HR 76 | Temp 97.6°F | Resp 18 | Ht 60.0 in | Wt 87.6 lb

## 2014-09-09 DIAGNOSIS — E785 Hyperlipidemia, unspecified: Secondary | ICD-10-CM | POA: Diagnosis not present

## 2014-09-09 DIAGNOSIS — K5909 Other constipation: Secondary | ICD-10-CM | POA: Diagnosis not present

## 2014-09-09 DIAGNOSIS — Z23 Encounter for immunization: Secondary | ICD-10-CM | POA: Diagnosis not present

## 2014-09-09 DIAGNOSIS — F0280 Dementia in other diseases classified elsewhere without behavioral disturbance: Secondary | ICD-10-CM | POA: Diagnosis not present

## 2014-09-09 DIAGNOSIS — G309 Alzheimer's disease, unspecified: Secondary | ICD-10-CM

## 2014-09-09 DIAGNOSIS — E440 Moderate protein-calorie malnutrition: Secondary | ICD-10-CM

## 2014-09-09 DIAGNOSIS — F0150 Vascular dementia without behavioral disturbance: Secondary | ICD-10-CM | POA: Diagnosis not present

## 2014-09-09 DIAGNOSIS — I10 Essential (primary) hypertension: Secondary | ICD-10-CM | POA: Diagnosis not present

## 2014-09-09 NOTE — Progress Notes (Signed)
Patient ID: Stacey Fernandez, female   DOB: August 11, 1929, 81 y.o.   MRN: [id number]    PCP: Lauree Chandler, NP  No Known Allergies  Chief Complaint  Patient presents with  . Medical Management of Chronic Issues     HPI: Patient is a 81 y.o. female seen in the office today for routine follow up, pt with a pmh of dementia, hyperlipidemia, HTN, constipation, protein calorie malnutrition. Pt previously seen by Dr Bubba Camp. Here with caregiver today.  Recently seen the eye doctor, another drop added.  Caregiver reports she has been eating good. Eating 3 meals a day, was having loose stools so she took her off ensure for a little while but has recently added it back.  Off all constipation medications, occasionally will have diarrhea.  Hard to get her to drink  Advanced Directive information Does patient have an advance directive?: No, Would patient like information on creating an advanced directive?: Yes - Educational materials given Review of Systems: provided by caregiver Review of Systems  Constitutional: Negative for fever, chills and appetite change.  HENT: Negative for mouth sores, sinus pressure and sore throat.   Eyes: Negative for visual disturbance.  Respiratory: Negative for cough and shortness of breath.   Cardiovascular: Negative for chest pain, palpitations and leg swelling.  Gastrointestinal: Negative for nausea, vomiting, abdominal pain and constipation.       Will have occasional loose stools, off all constipation   Genitourinary: Negative for dysuria.  Musculoskeletal: Negative for joint swelling, arthralgias, gait problem and neck pain.       No use of assisitive device, No falls   Skin: Negative for rash.  Neurological: Negative for dizziness, tremors, weakness and light-headedness.  Psychiatric/Behavioral: Negative for behavioral problems, sleep disturbance and agitation.    Past Medical History  Diagnosis Date  . Dementia   . Hyperlipidemia   . Lung mass     . COPD (chronic obstructive pulmonary disease)   . Senile dementia with delirium   . Urinary frequency   . Hemiplegia affecting dominant side, late effect of cerebrovascular disease   . Anxiety state, unspecified   . Chronic airway obstruction, not elsewhere classified   . Other malaise and fatigue   . Memory loss    Past Surgical History  Procedure Laterality Date  . Wedge resection rul nodule  01/10/2011    VAN TRIGT  . Mediastinal lymp node dissection  01/10/2011    VAN TRIGT  . Eye surgery      cataract   Social History:   reports that she has quit smoking. She does not have any smokeless tobacco history on file. She reports that she does not drink alcohol or use illicit drugs.  Family History  Problem Relation Age of Onset  . Heart disease Mother   . Cancer Father   . Heart disease Sister   . Heart disease Brother     Medications: Patient's Medications  New Prescriptions   No medications on file  Previous Medications   ASPIRIN 81 MG TABLET    Take 81 mg by mouth daily.   ATORVASTATIN (LIPITOR) 10 MG TABLET    TAKE 1 TABLET BY MOUTH EVERY DAY   BIOTIN 5 MG/ML LIQD    Take by mouth.   DOCUSATE SODIUM (ENEMEEZ) 283 MG ENEMA    Place 1 enema (283 mg total) rectally daily as needed for severe constipation. Every 3 days if no bowel movement for 3 days   FUROSEMIDE (LASIX) 20 MG TABLET  TAKE 0.5 TABLETS (10 MG TOTAL) BY MOUTH DAILY.   LATANOPROST (XALATAN) 0.005 % OPHTHALMIC SOLUTION    PLACE 1 DROP INTO BOTH EYES AT BEDTIME.   MIRTAZAPINE (REMERON) 15 MG TABLET    Take 1/2 tablet by mouth at bedtime for rest   NAMENDA XR 28 MG CP24 24 HR CAPSULE    TAKE ONE CAPSULE EVERY DAY   SODIUM CHLORIDE (OCEAN) 0.65 % SOLN NASAL SPRAY    Place 1 spray into both nostrils as needed for congestion.   TRUSOPT 2 % OPHTHALMIC SOLUTION      Modified Medications   No medications on file  Discontinued Medications   DOCUSATE SODIUM (COLACE) 100 MG CAPSULE    TAKE 1 CAPSULE (100 MG TOTAL)  BY MOUTH 2 (TWO) TIMES DAILY.   LATANOPROST (XALATAN) 0.005 % OPHTHALMIC SOLUTION    PLACE 1 DROP INTO BOTH EYES AT BEDTIME.   MIRTAZAPINE (REMERON) 15 MG TABLET    TAKE 1/2 TABLET BY MOUTH AT BEDTIME.   MIRTAZAPINE (REMERON) 15 MG TABLET    Take 1/2 tablet by mouth at bedtime for rest   NAMENDA XR 28 MG CP24    TAKE ONE CAPSULE EVERY DAY   POLYETHYLENE GLYCOL POWDER (GLYCOLAX/MIRALAX) POWDER    TAKE 17 G BY MOUTH DAILY.   POLYETHYLENE GLYCOL POWDER (GLYCOLAX/MIRALAX) POWDER    TAKE 17 G BY MOUTH DAILY.   TDAP (ADACEL) 08-15-13.5 LF-MCG/0.5 INJECTION    Inject 0.5 mLs into the muscle once.   ZOSTER VACCINE LIVE, PF, (ZOSTAVAX) 25053 UNT/0.65ML INJECTION    Inject 19,400 Units into the skin once.     Physical Exam:  Filed Vitals:   09/09/14 1017  BP: 120/72  Pulse: 76  Temp: 97.6 F (36.4 C)  TempSrc: Oral  Resp: 18  Height: 5' (1.524 m)  Weight: 87 lb 9.6 oz (39.735 kg)  SpO2: 99%    Physical Exam  Constitutional: No distress.  Thin frail female   HENT:  Head: Normocephalic and atraumatic.  Mouth/Throat: Oropharynx is clear and moist. No oropharyngeal exudate.  Eyes: Conjunctivae are normal. Pupils are equal, round, and reactive to light.  Neck: Normal range of motion. Neck supple.  Cardiovascular: Normal rate, regular rhythm and normal heart sounds.   Pulmonary/Chest: Effort normal and breath sounds normal.  Abdominal: Soft. Bowel sounds are normal.  Musculoskeletal: She exhibits no edema or tenderness.  Neurological: She is alert.  Skin: Skin is warm and dry. She is not diaphoretic.  Psychiatric: She has a normal mood and affect. Her behavior is normal. Cognition and memory are impaired. She exhibits abnormal recent memory and abnormal remote memory.    Labs reviewed: Basic Metabolic Panel:  Recent Labs  01/13/14 1157 01/13/14 1305 03/03/14 1037  NA  --   --  147*  K  --   --  4.3  CL  --   --  108  CO2  --   --  25  GLUCOSE  --   --  85  BUN  --  20 19    CREATININE 0.90  --  1.06*  CALCIUM  --   --  8.8  TSH  --   --  1.150   Liver Function Tests:  Recent Labs  03/03/14 1037  AST 23  ALT 11  ALKPHOS 67  BILITOT <0.2  PROT 6.5   No results for input(s): LIPASE, AMYLASE in the last 8760 hours. No results for input(s): AMMONIA in the last 8760 hours. CBC:  Recent Labs  03/03/14 1037  WBC 5.1  NEUTROABS 1.5  HGB 13.0  HCT 39.4  MCV 89   Lipid Panel:  Recent Labs  03/03/14 1037  CHOL 186  HDL 65  LDLCALC 108*  TRIG 63  CHOLHDL 2.9   TSH:  Recent Labs  03/03/14 1037  TSH 1.150   A1C: No results found for: HGBA1C   Assessment/Plan  1. Essential hypertension -controlled, taking lasix but having a hard time getting fluids in, will stop at this time due to risk of dehydration - Comprehensive metabolic panel  2. Other constipation -has improved, now with loose stools at times, has stopped all medication for constipation -may use benefiber daily for bulking  3. Protein-calorie malnutrition, moderate Weight down since last visit but eating well per caregiver, to cont on ensure twice daily and encourage nutrition   4. Mixed Alzheimer's and vascular dementia Advanced dementia, conts on namenda -caregiver helps her with most ADLs  5. Hyperlipidemia -on lipitor, will follow up blood work today   Follow up in 3 months

## 2014-09-09 NOTE — Patient Instructions (Addendum)
May use benefiber daily to bulk up stools Cont with ensure for supplement twice daily Will get blood work today

## 2014-09-10 LAB — COMPREHENSIVE METABOLIC PANEL
A/G RATIO: 1.4 (ref 1.1–2.5)
ALBUMIN: 3.9 g/dL (ref 3.5–4.7)
ALT: 20 IU/L (ref 0–32)
AST: 27 IU/L (ref 0–40)
Alkaline Phosphatase: 71 IU/L (ref 39–117)
BUN/Creatinine Ratio: 20 (ref 11–26)
BUN: 19 mg/dL (ref 8–27)
Bilirubin Total: <0.2 mg/dL (ref 0.0–1.2)
CO2: 19 mmol/L (ref 18–29)
Calcium: 8.9 mg/dL (ref 8.7–10.3)
Chloride: 107 mmol/L (ref 97–108)
Creatinine, Ser: 0.96 mg/dL (ref 0.57–1.00)
GFR calc Af Amer: 63 mL/min/{1.73_m2} (ref >59)
GFR calc non Af Amer: 55 mL/min/{1.73_m2} — ABNORMAL LOW (ref >59)
GLUCOSE: 85 mg/dL (ref 65–99)
Globulin, Total: 2.7 g/dL (ref 1.5–4.5)
Potassium: 3.9 mmol/L (ref 3.5–5.2)
Sodium: 146 mmol/L — ABNORMAL HIGH (ref 134–144)
Total Protein: 6.6 g/dL (ref 6.0–8.5)

## 2014-09-22 ENCOUNTER — Telehealth: Payer: Self-pay | Admitting: *Deleted

## 2014-09-22 NOTE — Telephone Encounter (Signed)
Patient's caregiver called regarding referral for podiatrist , she stated that her toenail is coming off more now than before so she is a little worried. I informed her that I sent a message to Sherrie Mustache regarding the referral and I will get back with her in the morning.

## 2014-09-24 ENCOUNTER — Telehealth: Payer: Self-pay | Admitting: *Deleted

## 2014-09-24 ENCOUNTER — Telehealth: Payer: Self-pay

## 2014-09-24 DIAGNOSIS — M25579 Pain in unspecified ankle and joints of unspecified foot: Secondary | ICD-10-CM

## 2014-09-24 NOTE — Telephone Encounter (Signed)
Left message to call back on both numbers 

## 2014-09-24 NOTE — Telephone Encounter (Signed)
I would have her use TED/compression hose to place during the day and take off at night to see if this helps first

## 2014-09-24 NOTE — Telephone Encounter (Signed)
Having swelling in legs/ankles. Stacey Fernandez had stopped her Lasix 09/09/14 because she was afraid of dehydration, the patient wasn't getting enough fluids. Tyro said that was true at the time, they were luck if they could get 8 oz in her. Now she is drinking at least 32 oz of water, fiber drink and juice. Can they restart the Lasix? Route to [place]

## 2014-09-24 NOTE — Telephone Encounter (Signed)
Spoke with [facility], gave her Jessica's message to try the hose, if the swelling doesn't get better to call back next week

## 2014-09-24 NOTE — Telephone Encounter (Signed)
Called the patient's caregiver Nolon Stalls) [facility] to return call to the office regarding a podiatrist referral.

## 2014-09-28 ENCOUNTER — Telehealth: Payer: Self-pay | Admitting: *Deleted

## 2014-09-28 ENCOUNTER — Other Ambulatory Visit: Payer: Self-pay | Admitting: *Deleted

## 2014-09-28 DIAGNOSIS — L600 Ingrowing nail: Secondary | ICD-10-CM

## 2014-09-28 NOTE — Telephone Encounter (Signed)
Patient' daughter called about her mother ankles being extremely swollen, she wanted to know if her mother could be put back on her lasix. I informed her that I would have to check with her PCP. After speaking with Dr. Eulas Post she stated that it would be okay to put her back on her lasix. So I called her daughter to tell her that it was okay to put back on the last dose.

## 2014-10-12 ENCOUNTER — Other Ambulatory Visit: Payer: Self-pay | Admitting: Internal Medicine

## 2014-10-20 LAB — HM MAMMOGRAPHY: HM MAMMO: NORMAL

## 2014-10-22 ENCOUNTER — Other Ambulatory Visit: Payer: Self-pay | Admitting: *Deleted

## 2014-10-26 ENCOUNTER — Encounter: Payer: Self-pay | Admitting: Podiatry

## 2014-10-26 ENCOUNTER — Ambulatory Visit (INDEPENDENT_AMBULATORY_CARE_PROVIDER_SITE_OTHER): Payer: Medicare Other | Admitting: Podiatry

## 2014-10-26 VITALS — BP 160/100 | HR 68 | Temp 98.7°F | Resp 12

## 2014-10-26 DIAGNOSIS — B351 Tinea unguium: Secondary | ICD-10-CM | POA: Diagnosis not present

## 2014-10-26 DIAGNOSIS — M79676 Pain in unspecified toe(s): Secondary | ICD-10-CM

## 2014-10-26 NOTE — Progress Notes (Signed)
   Subjective:    Patient ID: Stacey Fernandez, female    DOB: 1930-02-10, 81 y.o.   MRN: [id number]  HPI Patient requesting for toenail debridement. This patient presents with her caregiver, Melina who speaks for the patient. She says that the left hallux nail and second right toenail are uncomfortable patient wear shoes  Review of Systems  All other systems reviewed and are negative.      Objective:   Physical Exam  Patient is confused and not able to respond. Her caregiver Thompson Caul is present in the room and speaks for her  Vascular: No peripheral edema noted bilaterally DP pulses 2/4 bilaterally ET pulses 1/4 bilaterally  Neurological: Ankle reflex reactive bilaterally Sensation to 10 g monofilament wire patient not able to respond Sensation to vibratory sensation patient not able to respond  Dermatological: The second right toenail and first left toenails are hypertrophic brittle, discolored and tender to direct palpation The remaining toenails are normal trophic  Musculoskeletal: HAV deformities bilaterally Hammertoe second right Is no restriction ankle, subtalar, midtarsal joints bilaterally      Assessment & Plan:   Assessment: Satisfactory vascular status Symptomatic onychomycoses 1-5 Asymptomatic toenails 5  Plan: Debridement of symptomatic mycotic toenails mechanically and likely without a bleeding  Debridement of the remaining toenails without any bleeding  Advised caregiver to return patient as needed for debridement

## 2014-10-26 NOTE — Patient Instructions (Signed)
Return as needed for debridement of mycotic toenails

## 2014-11-06 ENCOUNTER — Other Ambulatory Visit: Payer: Self-pay | Admitting: Internal Medicine

## 2014-11-13 ENCOUNTER — Other Ambulatory Visit: Payer: Self-pay | Admitting: Nurse Practitioner

## 2014-12-02 ENCOUNTER — Ambulatory Visit: Payer: BC Managed Care – PPO | Admitting: Nurse Practitioner

## 2014-12-02 ENCOUNTER — Encounter: Payer: Self-pay | Admitting: Nurse Practitioner

## 2014-12-07 ENCOUNTER — Encounter: Payer: Self-pay | Admitting: Nurse Practitioner

## 2014-12-07 ENCOUNTER — Ambulatory Visit (INDEPENDENT_AMBULATORY_CARE_PROVIDER_SITE_OTHER): Payer: Medicare Other | Admitting: Nurse Practitioner

## 2014-12-07 VITALS — BP 112/76 | HR 71 | Temp 97.8°F | Resp 20 | Ht 60.0 in | Wt 88.4 lb

## 2014-12-07 DIAGNOSIS — K5904 Chronic idiopathic constipation: Secondary | ICD-10-CM

## 2014-12-07 DIAGNOSIS — G309 Alzheimer's disease, unspecified: Secondary | ICD-10-CM | POA: Diagnosis not present

## 2014-12-07 DIAGNOSIS — E785 Hyperlipidemia, unspecified: Secondary | ICD-10-CM | POA: Diagnosis not present

## 2014-12-07 DIAGNOSIS — R609 Edema, unspecified: Secondary | ICD-10-CM

## 2014-12-07 DIAGNOSIS — L8992 Pressure ulcer of unspecified site, stage 2: Secondary | ICD-10-CM

## 2014-12-07 DIAGNOSIS — R634 Abnormal weight loss: Secondary | ICD-10-CM | POA: Diagnosis not present

## 2014-12-07 DIAGNOSIS — I10 Essential (primary) hypertension: Secondary | ICD-10-CM | POA: Diagnosis not present

## 2014-12-07 DIAGNOSIS — K5909 Other constipation: Secondary | ICD-10-CM

## 2014-12-07 DIAGNOSIS — F0280 Dementia in other diseases classified elsewhere without behavioral disturbance: Secondary | ICD-10-CM

## 2014-12-07 NOTE — Patient Instructions (Addendum)
Follow up in 3 months with fasting blood work prior to appt Increase protein intake -at least 2 nutritional supplement drinks    Pressure Ulcer A pressure ulcer is a sore that has formed from the breakdown of skin and exposure of deeper layers of tissue. It develops in areas of the body where there is unrelieved pressure. Pressure ulcers are usually found over a bony area, such as the shoulder blades, spine, lower back, hips, knees, ankles, and heels. Pressure ulcers vary in severity. Your health care provider may determine the severity (stage) of your pressure ulcer. The stages include:  Stage I--The skin is red, and when the skin is pressed, it stays red.  Stage II--The top layer of skin is gone, and there is a shallow, pink ulcer.  Stage III--The ulcer becomes deeper, and it is more difficult to see the whole wound. Also, there may be yellow or brown parts, as well as pink and red parts.  Stage IV--The ulcer may be deep and red, pink, brown, white, or yellow. Bone or muscle may be seen.  Unstageable pressure ulcer--The ulcer is covered almost completely with black, brown, or yellow tissue. It is not known how deep the ulcer is or what stage it is until this covering comes off.  Suspected deep tissue injury--A person's skin can be injured from pressure or pulling on the skin when his or her position is changed. The skin appears purple or maroon. There may not be an opening in the skin, but there could be a blood-filled blister. This deep tissue injury is often difficult to see in people with darker skin tones. The site may open and become deeper in time. However, early interventions will help the area heal and may prevent the area from opening. CAUSES  Pressure ulcers are caused by pressure against the skin that limits the flow of blood to the skin and nearby tissues. There are many risk factors that can lead to pressure sores. RISK FACTORS  Decreased ability to move.  Decreased  ability to feel pain or discomfort.  Excessive skin moisture from urine, stool, sweat, or secretions.  Poor nutrition.  Dehydration.  Tobacco, drug, or alcohol abuse.  Having someone pull on bedsheets that are under you, such as when health care workers are changing your position in a hospital bed.  Obesity.  Increased adult age.  Hospitalization in a critical care unit for longer than 4 days with use of medical devices.  Prolonged use of medical devices.  Critical illness.  Anemia.  Traumatic brain injury.  Spinal cord injury.  Stroke.  Diabetes.  Poor blood glucose control.  Low blood pressure (hypotension).  Low oxygen levels.  Medicines that reduce blood flow.  Infection. DIAGNOSIS  Your health care provider will diagnose your pressure ulcer based on its appearance. The health care provider may determine the stage of your pressure ulcer as well. Tests may be done to check for infection, to assess your circulation, or to check for other diseases, such as diabetes. TREATMENT  Treatment of your pressure ulcer begins with determining what stage the ulcer is in. Your treatment team may include your health care provider, a wound care specialist, a nutritionist, a physical therapist, and a Psychologist, sport and exercise. Possible treatments may include:   Moving or repositioning every 1-2 hours.  Using beds or mattresses to shift your body weight and pressure points frequently.  Improving your diet.  Cleaning and bandaging (dressing) the open wound.  Giving antibiotic medicines.  Removing damaged tissue.  Surgery and sometimes skin grafts. HOME CARE INSTRUCTIONS  If you were hospitalized, follow the care plan that was started in the hospital.  Avoid staying in the same position for more than 2 hours. Use padding, devices, or mattresses to cushion your pressure points as directed by your health care provider.  Eat a well-balanced diet. Take nutritional supplements and vitamins  as directed by your health care provider.  Keep all follow-up appointments.  Only take over-the-counter or prescription medicines for pain, fever, or discomfort as directed by your health care provider. SEEK MEDICAL CARE IF:   Your pressure ulcer is not improving.  You do not know how to care for your pressure ulcer.  You notice other areas of redness on your skin.  You have a fever. SEEK IMMEDIATE MEDICAL CARE IF:   You have increasing redness, swelling, or pain in your pressure ulcer.  You notice pus coming from your pressure ulcer.  You notice a bad smell coming from the wound or dressing.  Your pressure ulcer opens up again. Document Released: 04/02/2005 Document Revised: 04/07/2013 Document Reviewed: 12/08/2012 [facility] Patient Information 2015 [place], [place]. This information is not intended to replace advice given to you by your health care provider. Make sure you discuss any questions you have with your health care provider.

## 2014-12-07 NOTE — Progress Notes (Signed)
Patient ID: Stacey Fernandez, female   DOB: 09-14-1929, 81 y.o.   MRN: [id number]    PCP: Sharon Seller, NP  No Known Allergies  Chief Complaint  Patient presents with  . Medical Management of Chronic Issues    3 month folow-up for hypertesion     HPI: Patient is a 81 y.o. female seen in the office today for routine follow up, pt with a pmh of dementia, hyperlipidemia, HTN, constipation, protein calorie malnutrition. Here with caregiver who provides all ROS and HPI.  Was restarted back on lasix due to bad swelling in lower legs. Improved once restarted.  Eating good, 3 meals a day and snack Drinking supplements at times-- limited to juice ones only, milk based gives her GI upset Bowels on and off, has constipation then diarrhea Taking metamucil- hard to balance   Lives with brother- caregivers there 18 hours  Review of Systems: provided by caregiver Review of Systems  Constitutional: Negative for fever, chills and appetite change.  HENT: Negative for mouth sores, sinus pressure and sore throat.   Eyes: Negative for visual disturbance.  Respiratory: Negative for cough and shortness of breath.        Shortness of breath with increase activity  Cardiovascular: Negative for chest pain, palpitations and leg swelling.  Gastrointestinal: Negative for nausea, vomiting, diarrhea and constipation.       Will have occasional loose stools, off all constipation   Genitourinary: Negative for dysuria.  Musculoskeletal: Negative for joint swelling, arthralgias, gait problem and neck pain.       No use of assisitive device, No falls   Skin: Negative for rash.  Neurological: Negative for dizziness, tremors, weakness and light-headedness.  Psychiatric/Behavioral: Negative for behavioral problems, sleep disturbance and agitation.       Increase in sleep     Past Medical History  Diagnosis Date  . Dementia   . Hyperlipidemia   . Lung mass   . COPD (chronic obstructive pulmonary  disease)   . Senile dementia with delirium   . Urinary frequency   . Hemiplegia affecting dominant side, late effect of cerebrovascular disease   . Anxiety state, unspecified   . Chronic airway obstruction, not elsewhere classified   . Other malaise and fatigue   . Memory loss    Past Surgical History  Procedure Laterality Date  . Wedge resection rul nodule  01/10/2011    VAN TRIGT  . Mediastinal lymp node dissection  01/10/2011    VAN TRIGT  . Eye surgery      cataract   Social History:   reports that she has quit smoking. She has never used smokeless tobacco. She reports that she does not drink alcohol or use illicit drugs.  Family History  Problem Relation Age of Onset  . Heart disease Mother   . Cancer Father   . Heart disease Sister   . Heart disease Brother     Medications: Patient's Medications  New Prescriptions   No medications on file  Previous Medications   ASPIRIN 81 MG TABLET    Take 81 mg by mouth daily.   ATORVASTATIN (LIPITOR) 10 MG TABLET    TAKE 1 TABLET BY MOUTH EVERY DAY   FUROSEMIDE (LASIX) 20 MG TABLET    TAKE 0.5 TABLETS (10 MG TOTAL) BY MOUTH DAILY.   LATANOPROST (XALATAN) 0.005 % OPHTHALMIC SOLUTION    PLACE 1 DROP INTO BOTH EYES AT BEDTIME.   MIRTAZAPINE (REMERON) 15 MG TABLET    TAKE 1/2  TABLET BY MOUTH AT BEDTIME.   NAMENDA XR 28 MG CP24 24 HR CAPSULE    TAKE 1 CAPSULE BY MOUTH EVERY DAY   SODIUM CHLORIDE (OCEAN) 0.65 % SOLN NASAL SPRAY    Place 1 spray into both nostrils as needed for congestion.   TRUSOPT 2 % OPHTHALMIC SOLUTION      Modified Medications   No medications on file  Discontinued Medications   BIOTIN 5 MG/ML LIQD    Take by mouth.   DOCUSATE SODIUM (ENEMEEZ) 283 MG ENEMA    Place 1 enema (283 mg total) rectally daily as needed for severe constipation. Every 3 days if no bowel movement for 3 days   FUROSEMIDE (LASIX) 20 MG TABLET    Take 0.5 mg by mouth daily.   MIRTAZAPINE (REMERON) 15 MG TABLET    Take 1/2 tablet by mouth at  bedtime for rest     Physical Exam:  Filed Vitals:   12/07/14 1030  BP: 112/76  Pulse: 71  Temp: 97.8 F (36.6 C)  TempSrc: Oral  Resp: 20  Height: 5' (1.524 m)  Weight: 88 lb 6.4 oz (40.098 kg)  SpO2: 96%    Physical Exam  Constitutional: No distress.  Thin frail female   HENT:  Head: Normocephalic and atraumatic.  Mouth/Throat: Oropharynx is clear and moist. No oropharyngeal exudate.  Eyes: Conjunctivae are normal. Pupils are equal, round, and reactive to light.  Neck: Normal range of motion. Neck supple.  Cardiovascular: Normal rate, regular rhythm and normal heart sounds.   Pulmonary/Chest: Effort normal and breath sounds normal.  Abdominal: Soft. Bowel sounds are normal.  Musculoskeletal: She exhibits no edema or tenderness.  Neurological: She is alert.  Skin: Skin is warm and dry. She is not diaphoretic.  1.5 x1.5 cm Stage 2 pressure ulcer to right buttock   Psychiatric: She has a normal mood and affect. Her behavior is normal. Cognition and memory are impaired. She exhibits abnormal recent memory and abnormal remote memory.    Labs reviewed: Basic Metabolic Panel:  Recent Labs  16/10/96 1157 01/13/14 1305 03/03/14 1037 09/09/14 1104  NA  --   --  147* 146*  K  --   --  4.3 3.9  CL  --   --  108 107  CO2  --   --  25 19  GLUCOSE  --   --  85 85  BUN  --  20 19 19   CREATININE 0.90  --  1.06* 0.96  CALCIUM  --   --  8.8 8.9  TSH  --   --  1.150  --    Liver Function Tests:  Recent Labs  03/03/14 1037 09/09/14 1104  AST 23 27  ALT 11 20  ALKPHOS 67 71  BILITOT <0.2 <0.2  PROT 6.5 6.6   No results for input(s): LIPASE, AMYLASE in the last 8760 hours. No results for input(s): AMMONIA in the last 8760 hours. CBC:  Recent Labs  03/03/14 1037  WBC 5.1  NEUTROABS 1.5  HGB 13.0  HCT 39.4  MCV 89   Lipid Panel:  Recent Labs  03/03/14 1037  CHOL 186  HDL 65  LDLCALC 108*  TRIG 63  CHOLHDL 2.9   TSH:  Recent Labs  03/03/14 1037    TSH 1.150   A1C: No results found for: HGBA1C   Assessment/Plan  1. Essential hypertension -blood pressure stable. Only on lasix 10 mg daily at this time -taking ASA daily - CBC with Differential;  Future  2. Constipation - functional -stable, family working with medications to find best combination for pt. Will have mixed constipation with diarrhea at times.   3. Alzheimer's dementia without behavioral disturbance -advanced disease. Requires care 24/7. Caregivers providing 18 hours. conts on namenda.    4. Edema Improved with restart on lasix. Will cont.  5. Loss of weight Weight has remained stable since last visit. Recommended to liberalized diet, cont on supplement  -conts on remeron qhs  6. Hyperlipidemia -cont on lipitor - Comprehensive metabolic panel; Future - Lipid panel; Future  7. Pressure ulcer, stage 2 -discussed frequent changes in positioning for pt, using cushions and air mattress for pressure reduction -to use DuoDerm change 5 days unless coming off on its on.  -notify if worsening of pressure ulcer. May use barrier cream once pressure area heals.    Follow up in 3 months, sooner if needed    Anastyn Ayars K. Biagio Borg  [facility] [phone] 8 am - 5 pm) [phone] (after hours)

## 2015-01-14 ENCOUNTER — Telehealth: Payer: Self-pay | Admitting: *Deleted

## 2015-01-14 NOTE — Telephone Encounter (Signed)
Caregiver called and wanted a Handicap Placard for patient placed in the Aid's name. I informed her that the Handicap Placards could only be issued in the patient's name.

## 2015-02-03 ENCOUNTER — Other Ambulatory Visit: Payer: Self-pay | Admitting: Nurse Practitioner

## 2015-02-12 ENCOUNTER — Other Ambulatory Visit: Payer: Self-pay | Admitting: Nurse Practitioner

## 2015-03-04 ENCOUNTER — Other Ambulatory Visit: Payer: Medicare Other

## 2015-03-04 ENCOUNTER — Telehealth: Payer: Self-pay | Admitting: Nurse Practitioner

## 2015-03-04 DIAGNOSIS — E785 Hyperlipidemia, unspecified: Secondary | ICD-10-CM

## 2015-03-04 DIAGNOSIS — I10 Essential (primary) hypertension: Secondary | ICD-10-CM

## 2015-03-04 NOTE — Telephone Encounter (Signed)
Ms. Mireles wants to change provider and see Dr. Eulas Post. Please Advise.

## 2015-03-04 NOTE — Telephone Encounter (Signed)
Ok to change PCPs

## 2015-03-05 LAB — CBC WITH DIFFERENTIAL/PLATELET
Basophils Absolute: 0.1 10*3/uL (ref 0.0–0.2)
Basos: 1 %
EOS (ABSOLUTE): 0.1 10*3/uL (ref 0.0–0.4)
Eos: 2 %
HEMOGLOBIN: 13 g/dL (ref 11.1–15.9)
Hematocrit: 40.8 % (ref 34.0–46.6)
Immature Grans (Abs): 0 10*3/uL (ref 0.0–0.1)
Immature Granulocytes: 0 %
LYMPHS ABS: 2.4 10*3/uL (ref 0.7–3.1)
Lymphs: 53 %
MCH: 28.6 pg (ref 26.6–33.0)
MCHC: 31.9 g/dL (ref 31.5–35.7)
MCV: 90 fL (ref 79–97)
MONOCYTES: 8 %
MONOS ABS: 0.4 10*3/uL (ref 0.1–0.9)
NEUTROS ABS: 1.6 10*3/uL (ref 1.4–7.0)
Neutrophils: 36 %
Platelets: 176 10*3/uL (ref 150–379)
RBC: 4.55 x10E6/uL (ref 3.77–5.28)
RDW: 15 % (ref 12.3–15.4)
WBC: 4.5 10*3/uL (ref 3.4–10.8)

## 2015-03-05 LAB — LIPID PANEL
CHOL/HDL RATIO: 2.8 ratio (ref 0.0–4.4)
Cholesterol, Total: 154 mg/dL (ref 100–199)
HDL: 56 mg/dL (ref >39)
LDL CALC: 85 mg/dL (ref 0–99)
Triglycerides: 64 mg/dL (ref 0–149)
VLDL Cholesterol Cal: 13 mg/dL (ref 5–40)

## 2015-03-05 LAB — COMPREHENSIVE METABOLIC PANEL
ALBUMIN: 3.7 g/dL (ref 3.5–4.7)
ALK PHOS: 86 IU/L (ref 39–117)
ALT: 30 IU/L (ref 0–32)
AST: 26 IU/L (ref 0–40)
Albumin/Globulin Ratio: 1.3 (ref 1.1–2.5)
BILIRUBIN TOTAL: 0.4 mg/dL (ref 0.0–1.2)
BUN / CREAT RATIO: 23 (ref 11–26)
BUN: 22 mg/dL (ref 8–27)
CO2: 23 mmol/L (ref 18–29)
Calcium: 9.1 mg/dL (ref 8.7–10.3)
Chloride: 107 mmol/L — ABNORMAL HIGH (ref 97–106)
Creatinine, Ser: 0.95 mg/dL (ref 0.57–1.00)
GFR calc non Af Amer: 56 mL/min/{1.73_m2} — ABNORMAL LOW (ref >59)
GFR, EST AFRICAN AMERICAN: 64 mL/min/{1.73_m2} (ref >59)
GLOBULIN, TOTAL: 2.9 g/dL (ref 1.5–4.5)
Glucose: 81 mg/dL (ref 65–99)
Potassium: 4 mmol/L (ref 3.5–5.2)
SODIUM: 145 mmol/L — AB (ref 136–144)
TOTAL PROTEIN: 6.6 g/dL (ref 6.0–8.5)

## 2015-03-08 ENCOUNTER — Encounter: Payer: Self-pay | Admitting: Nurse Practitioner

## 2015-03-08 ENCOUNTER — Ambulatory Visit (INDEPENDENT_AMBULATORY_CARE_PROVIDER_SITE_OTHER): Payer: Medicare Other | Admitting: Nurse Practitioner

## 2015-03-08 VITALS — BP 102/70 | HR 74 | Temp 97.2°F | Resp 20 | Ht 60.0 in | Wt 88.8 lb

## 2015-03-08 DIAGNOSIS — R609 Edema, unspecified: Secondary | ICD-10-CM

## 2015-03-08 DIAGNOSIS — L8992 Pressure ulcer of unspecified site, stage 2: Secondary | ICD-10-CM | POA: Diagnosis not present

## 2015-03-08 DIAGNOSIS — F0280 Dementia in other diseases classified elsewhere without behavioral disturbance: Secondary | ICD-10-CM

## 2015-03-08 DIAGNOSIS — G309 Alzheimer's disease, unspecified: Secondary | ICD-10-CM | POA: Diagnosis not present

## 2015-03-08 DIAGNOSIS — I10 Essential (primary) hypertension: Secondary | ICD-10-CM | POA: Diagnosis not present

## 2015-03-08 DIAGNOSIS — F0150 Vascular dementia without behavioral disturbance: Secondary | ICD-10-CM | POA: Diagnosis not present

## 2015-03-08 DIAGNOSIS — E440 Moderate protein-calorie malnutrition: Secondary | ICD-10-CM

## 2015-03-08 DIAGNOSIS — E785 Hyperlipidemia, unspecified: Secondary | ICD-10-CM | POA: Diagnosis not present

## 2015-03-08 DIAGNOSIS — Z23 Encounter for immunization: Secondary | ICD-10-CM

## 2015-03-08 NOTE — Progress Notes (Signed)
Patient ID: Stacey Fernandez, female   DOB: 1929-11-03, 81 y.o.   MRN: [id number]    PCP: Sharon Seller, NP  No Known Allergies  Chief Complaint  Patient presents with  . Medical Management of Chronic Issues    3 month follow-up for Hypertension, Hyperlipidemia, Alzheimers Disease      HPI: Patient is a 81 y.o. female seen in the office today for routine follow up, pt with a pmh of dementia, hyperlipidemia, HTN, constipation, protein calorie malnutrition. Pt reports she is doing well, with advanced dementia.  Here with caregiver who provides all ROS and HPI. Has care during the day and they put her to bed and get her up in the morning. No recent falls. Ambulating without assistance. Lives with brother Requires care for bathing, dressing and toilet.  Eating well. 100% of 3 meals a day with snacks. Unable to tolerate supplements due to diarrhea.    Ongoing sacral ulcer, using cushions for pressure reduction, turning frequently and increasing protein.   Review of Systems: provided by caregiver Review of Systems  Constitutional: Negative for fever, chills and appetite change.  HENT: Negative for mouth sores, sinus pressure and sore throat.   Eyes: Negative for visual disturbance.  Respiratory: Negative for cough and shortness of breath.   Cardiovascular: Negative for chest pain, palpitations and leg swelling.  Gastrointestinal: Negative for nausea, vomiting, diarrhea and constipation.  Genitourinary: Negative for dysuria.  Musculoskeletal: Negative for joint swelling, arthralgias, gait problem and neck pain.       No use of assisitive device, No falls   Skin: Positive for wound (sacral). Negative for rash.  Neurological: Negative for dizziness, tremors, weakness and light-headedness.  Psychiatric/Behavioral: Negative for behavioral problems, sleep disturbance and agitation.    Past Medical History  Diagnosis Date  . Dementia   . Hyperlipidemia   . Lung mass   . COPD  (chronic obstructive pulmonary disease) (HCC)   . Senile dementia with delirium   . Urinary frequency   . Hemiplegia affecting dominant side, late effect of cerebrovascular disease   . Anxiety state, unspecified   . Chronic airway obstruction, not elsewhere classified   . Other malaise and fatigue   . Memory loss    Past Surgical History  Procedure Laterality Date  . Wedge resection rul nodule  01/10/2011    VAN TRIGT  . Mediastinal lymp node dissection  01/10/2011    VAN TRIGT  . Eye surgery      cataract   Social History:   reports that she has quit smoking. She has never used smokeless tobacco. She reports that she does not drink alcohol or use illicit drugs.  Family History  Problem Relation Age of Onset  . Heart disease Mother   . Cancer Father   . Heart disease Sister   . Heart disease Brother     Medications: Patient's Medications  New Prescriptions   No medications on file  Previous Medications   ASPIRIN 81 MG TABLET    Take 81 mg by mouth daily.   ATORVASTATIN (LIPITOR) 10 MG TABLET    TAKE 1 TABLET BY MOUTH EVERY DAY   FUROSEMIDE (LASIX) 20 MG TABLET    TAKE 0.5 TABLETS (10 MG TOTAL) BY MOUTH DAILY.   LATANOPROST (XALATAN) 0.005 % OPHTHALMIC SOLUTION    PLACE 1 DROP INTO BOTH EYES AT BEDTIME.   MIRTAZAPINE (REMERON) 15 MG TABLET    TAKE 1/2 TABLET BY MOUTH AT BEDTIME.   NAMENDA XR 28 MG  CP24 24 HR CAPSULE    TAKE 1 CAPSULE BY MOUTH EVERY DAY   SODIUM CHLORIDE (OCEAN) 0.65 % SOLN NASAL SPRAY    Place 1 spray into both nostrils as needed for congestion.   TRUSOPT 2 % OPHTHALMIC SOLUTION      Modified Medications   No medications on file  Discontinued Medications   ATORVASTATIN (LIPITOR) 10 MG TABLET    TAKE 1 TABLET BY MOUTH EVERY DAY     Physical Exam:  Filed Vitals:   03/08/15 1033  BP: 102/70  Pulse: 74  Temp: 97.2 F (36.2 C)  TempSrc: Oral  Resp: 20  Height: 5' (1.524 m)  Weight: 88 lb 12.8 oz (40.279 kg)  SpO2: 93%    Physical Exam    Constitutional: No distress.  Thin frail female   HENT:  Head: Normocephalic and atraumatic.  Mouth/Throat: Oropharynx is clear and moist. No oropharyngeal exudate.  Eyes: Conjunctivae are normal. Pupils are equal, round, and reactive to light.  Neck: Normal range of motion. Neck supple.  Cardiovascular: Normal rate, regular rhythm and normal heart sounds.   Pulmonary/Chest: Effort normal and breath sounds normal.  Abdominal: Soft. Bowel sounds are normal.  Musculoskeletal: She exhibits no edema or tenderness.  Neurological: She is alert.  Skin: Skin is warm and dry. She is not diaphoretic.  4 x 2.5 cm area on right buttock that is healing - small amount of scabbing otherwise healing tissue  Psychiatric: She has a normal mood and affect. Her behavior is normal. Cognition and memory are impaired. She exhibits abnormal recent memory and abnormal remote memory.    Labs reviewed: Basic Metabolic Panel:  Recent Labs  16/10/96 1104 03/04/15 0847  NA 146* 145*  K 3.9 4.0  CL 107 107*  CO2 19 23  GLUCOSE 85 81  BUN 19 22  CREATININE 0.96 0.95  CALCIUM 8.9 9.1   Liver Function Tests:  Recent Labs  09/09/14 1104 03/04/15 0847  AST 27 26  ALT 20 30  ALKPHOS 71 86  BILITOT <0.2 0.4  PROT 6.6 6.6  ALBUMIN 3.9 3.7   No results for input(s): LIPASE, AMYLASE in the last 8760 hours. No results for input(s): AMMONIA in the last 8760 hours. CBC:  Recent Labs  03/04/15 0847  WBC 4.5  NEUTROABS 1.6  HCT 40.8   Lipid Panel:  Recent Labs  03/04/15 0847  CHOL 154  HDL 56  LDLCALC 85  TRIG 64  CHOLHDL 2.8   TSH: No results for input(s): TSH in the last 8760 hours. A1C: No results found for: HGBA1C   Assessment/Plan   1. Need for prophylactic vaccination and inoculation against influenza - Flu Vaccine QUAD 36+ mos PF IM (Fluarix & Fluzone Quad PF)  2. Protein-calorie malnutrition, moderate (HCC) -weight has remained stable, caregiver reports she is eating well  but does not gain weight. Does not tolerate milk based supplements, may use resource breeze which is juice based.  -conts on remeron 7.5 mg qhs -cont with increase protein due to heeling pressure ulcer   3. Hyperlipidemia LDL at goal with lipitor, lipids reviewed with pt and caregiver  4. Edema, unspecified type Stable on lasix 10 mg daily  5. Mixed Alzheimer's and vascular dementia -advanced dementia, stable in the last 3 months without acute worsening cognitive or functional status.  6. Essential hypertension Not requiring medication other than lasix 10 mg daily  7. Pressure ulcer, stage 2 -healing well with small area that remains with scab.  -to  cont to use DuoDerm change 5 days unless coming off on its own, to use barrier cream if not using duoderm. -notify if worsening of pressure ulcer. -cont with increased protein and pressure reduction     Follow up in 3 months, sooner if needed    Amerah Puleo K. Biagio Borg  [facility] [phone] 8 am - 5 pm) [phone] (after hours)

## 2015-03-08 NOTE — Patient Instructions (Addendum)
Cont to increase protein, may use resource breeze if tolerates   Cont to use DuoDerm for skin protector and then use barrier cream if not using DuoDerm dressing  Follow up in 3 months or sooner if needed Can add multivitamin daily

## 2015-03-17 ENCOUNTER — Other Ambulatory Visit: Payer: Self-pay | Admitting: Nurse Practitioner

## 2015-03-26 ENCOUNTER — Other Ambulatory Visit: Payer: Self-pay | Admitting: Nurse Practitioner

## 2015-04-04 ENCOUNTER — Telehealth: Payer: Self-pay | Admitting: *Deleted

## 2015-04-04 NOTE — Telephone Encounter (Signed)
Freda Munro, caregiver called and requested patient notes to be faxed to a fax [phone]. Explained to her that we couldn't fax it to a private fax # but we could have her sign a medication release and give her the records or the Insurance nurse could request the information. Caregiver agreed and will have nurse call if she needs the information.

## 2015-05-13 ENCOUNTER — Telehealth: Payer: Self-pay | Admitting: *Deleted

## 2015-05-13 NOTE — Telephone Encounter (Signed)
Meredith Staggers, caregiver called and stated that she is going to drop off [employer] Forms to be filled out by provider. Forms for [employer]. Please Complete and fax to [facility] at [facility]. She will drop off today.

## 2015-05-13 NOTE — Telephone Encounter (Signed)
Caregiver, Meredith Staggers dropped off paperwork for [employer] [phone] ([place]) for [place].  Given to [place] to review, fill out and sign. To be faxed back to Fax#: [phone]

## 2015-05-27 NOTE — Telephone Encounter (Signed)
[employer] Forms faxed to [place] Fax#: [phone] Meredith Staggers Notified.

## 2015-06-10 ENCOUNTER — Encounter: Payer: Self-pay | Admitting: Internal Medicine

## 2015-06-10 ENCOUNTER — Ambulatory Visit (INDEPENDENT_AMBULATORY_CARE_PROVIDER_SITE_OTHER): Payer: Medicare Other | Admitting: Internal Medicine

## 2015-06-10 VITALS — BP 100/72 | HR 76 | Temp 97.7°F | Ht 60.0 in | Wt 86.0 lb

## 2015-06-10 DIAGNOSIS — I10 Essential (primary) hypertension: Secondary | ICD-10-CM | POA: Diagnosis not present

## 2015-06-10 DIAGNOSIS — F0150 Vascular dementia without behavioral disturbance: Secondary | ICD-10-CM | POA: Diagnosis not present

## 2015-06-10 DIAGNOSIS — F0280 Dementia in other diseases classified elsewhere without behavioral disturbance: Secondary | ICD-10-CM | POA: Diagnosis not present

## 2015-06-10 DIAGNOSIS — R609 Edema, unspecified: Secondary | ICD-10-CM | POA: Diagnosis not present

## 2015-06-10 DIAGNOSIS — E440 Moderate protein-calorie malnutrition: Secondary | ICD-10-CM

## 2015-06-10 DIAGNOSIS — L8991 Pressure ulcer of unspecified site, stage 1: Secondary | ICD-10-CM

## 2015-06-10 DIAGNOSIS — G309 Alzheimer's disease, unspecified: Secondary | ICD-10-CM

## 2015-06-10 DIAGNOSIS — E785 Hyperlipidemia, unspecified: Secondary | ICD-10-CM

## 2015-06-10 NOTE — Progress Notes (Signed)
Patient ID: Stacey Fernandez, female   DOB: [birth year], 82 y.o.   MRN: [id number]    Location:    [facility]   Place of Service:  OFFICE   Chief Complaint  Patient presents with  . Medical Management of Chronic Issues    medication management blood pressure, Alzheimer's . Here with niece Velna Hatchet    HPI:  82 yo female seen today for f/u. Niece, Velna Hatchet, present. No concerns. She feeds herself. She has urinary incontinence. No bowel incontinence. No falls. She is a poor historian due to dementia. Hx obtained from niece. She has a "patch" on buttocks that was not healing. Now applying vasoline which helps. She saw the eye Dr and new lenses without bifocals ordered. She had been high stepping with bifocal. She resumed walking  Dementia/anxiety - takes namenda xr and remeron. No behavior disturbances  Hyperlipidemia - stable on lipitor. LDL 85  COPD/hx lung cancer- released from oncology last year as she has not had recurrence of tumor.   HTN/edema - stable on 1/2 tab of lasix. Also takes ASA daily  Constipation - stable. BM q3days  Glaucoma - stable on eye gtts. followed by eye specialist   Past Medical History  Diagnosis Date  . Dementia   . Hyperlipidemia   . Lung mass   . COPD (chronic obstructive pulmonary disease) (HCC)   . Senile dementia with delirium   . Urinary frequency   . Hemiplegia affecting dominant side, late effect of cerebrovascular disease   . Anxiety state, unspecified   . Chronic airway obstruction, not elsewhere classified   . Other malaise and fatigue   . Memory loss     Past Surgical History  Procedure Laterality Date  . Wedge resection rul nodule  01/10/2011    VAN TRIGT  . Mediastinal lymp node dissection  01/10/2011    VAN TRIGT  . Eye surgery      cataract    Patient Care Team: Kirt Boys, DO as PCP - General (Internal Medicine) Jeri Cos, MD (Internal Medicine) Kerin Perna, MD (Cardiothoracic Surgery)  Social History   Social  History  . Marital Status: Widowed    Spouse Name: N/A  . Number of Children: N/A  . Years of Education: N/A   Occupational History  . Not on file.   Social History Main Topics  . Smoking status: Former Smoker    Quit date: 06/09/2010  . Smokeless tobacco: Never Used  . Alcohol Use: No  . Drug Use: No  . Sexual Activity: No   Other Topics Concern  . Not on file   Social History Narrative     reports that she quit smoking about 5 years ago. She has never used smokeless tobacco. She reports that she does not drink alcohol or use illicit drugs.  No Known Allergies  Medications: Patient's Medications  New Prescriptions   No medications on file  Previous Medications   ASPIRIN 81 MG TABLET    Take 81 mg by mouth daily.   ATORVASTATIN (LIPITOR) 10 MG TABLET    TAKE 1 TABLET BY MOUTH EVERY DAY   FUROSEMIDE (LASIX) 20 MG TABLET    TAKE 0.5 TABLETS (10 MG TOTAL) BY MOUTH DAILY.   LATANOPROST (XALATAN) 0.005 % OPHTHALMIC SOLUTION    PLACE 1 DROP INTO BOTH EYES AT BEDTIME.   MIRTAZAPINE (REMERON) 15 MG TABLET    TAKE 1/2 TABLET BY MOUTH AT BEDTIME.   NAMENDA XR 28 MG CP24 24 HR CAPSULE  TAKE 1 CAPSULE BY MOUTH EVERY DAY   SODIUM CHLORIDE (OCEAN) 0.65 % SOLN NASAL SPRAY    Place 1 spray into both nostrils as needed for congestion.   TRUSOPT 2 % OPHTHALMIC SOLUTION      Modified Medications   No medications on file  Discontinued Medications   No medications on file    Review of Systems  Unable to perform ROS: Dementia    Filed Vitals:   06/10/15 1204  BP: 100/72  Pulse: 76  Temp: 97.7 F (36.5 C)  TempSrc: Oral  Height: 5' (1.524 m)  Weight: 86 lb (39.009 kg)  SpO2: 92%   Body mass index is 16.8 kg/(m^2).  Physical Exam  Constitutional: She appears well-developed.  Frail appearing in NAD.   HENT:  Mouth/Throat: Oropharynx is clear and moist. No oropharyngeal exudate.  Eyes: Pupils are equal, round, and reactive to light. No scleral icterus.  Neck: Neck supple.  Carotid bruit is not present. No tracheal deviation present. No thyromegaly present.  Cardiovascular: Normal rate, regular rhythm, normal heart sounds and intact distal pulses.  Exam reveals no gallop and no friction rub.   No murmur heard. No LE edema b/l. no calf TTP.   Pulmonary/Chest: Effort normal and breath sounds normal. No stridor. No respiratory distress. She has no wheezes. She has no rales.  Abdominal: Soft. Bowel sounds are normal. She exhibits no distension and no mass. There is no hepatomegaly. There is no tenderness. There is no rebound and no guarding.  Musculoskeletal: She exhibits edema.  Lymphadenopathy:    She has no cervical adenopathy.  Neurological: She is alert.  Skin: Skin is warm and dry. No rash noted.  Right buttock pressure sore healing well. No bleeding or d/c. No redness. NT  Psychiatric: She has a normal mood and affect. Her behavior is normal.     Labs reviewed: No visits with results within 3 Month(s) from this visit. Latest known visit with results is:  Appointment on 03/04/2015  Component Date Value Ref Range Status  . Glucose 03/04/2015 81  65 - 99 mg/dL Final  . BUN 47/82/9562 22  8 - 27 mg/dL Final  . Creatinine, Ser 03/04/2015 0.95  0.57 - 1.00 mg/dL Final  . GFR calc non Af Amer 03/04/2015 56* >59 mL/min/1.73 Final  . GFR calc Af Amer 03/04/2015 64  >59 mL/min/1.73 Final  . BUN/Creatinine Ratio 03/04/2015 23  11 - 26 Final  . Sodium 03/04/2015 145* 136 - 144 mmol/L Final  . Potassium 03/04/2015 4.0  3.5 - 5.2 mmol/L Final  . Chloride 03/04/2015 107* 97 - 106 mmol/L Final  . CO2 03/04/2015 23  18 - 29 mmol/L Final  . Calcium 03/04/2015 9.1  8.7 - 10.3 mg/dL Final  . Total Protein 03/04/2015 6.6  6.0 - 8.5 g/dL Final  . Albumin 13/11/6576 3.7  3.5 - 4.7 g/dL Final  . Globulin, Total 03/04/2015 2.9  1.5 - 4.5 g/dL Final  . Albumin/Globulin Ratio 03/04/2015 1.3  1.1 - 2.5 Final  . Bilirubin Total 03/04/2015 0.4  0.0 - 1.2 mg/dL Final  .  Alkaline Phosphatase 03/04/2015 86  39 - 117 IU/L Final  . AST 03/04/2015 26  0 - 40 IU/L Final  . ALT 03/04/2015 30  0 - 32 IU/L Final  . WBC 03/04/2015 4.5  3.4 - 10.8 x10E3/uL Final  . RBC 03/04/2015 4.55  3.77 - 5.28 x10E6/uL Final  . Hemoglobin 03/04/2015 13.0  11.1 - 15.9 g/dL Final  . Hematocrit 46/96/2952  40.8  34.0 - 46.6 % Final  . MCV 03/04/2015 90  79 - 97 fL Final  . MCH 03/04/2015 28.6  26.6 - 33.0 pg Final  . MCHC 03/04/2015 31.9  31.5 - 35.7 g/dL Final  . RDW 57/84/6962 15.0  12.3 - 15.4 % Final  . Platelets 03/04/2015 176  150 - 379 x10E3/uL Final  . Neutrophils 03/04/2015 36   Final  . Lymphs 03/04/2015 53   Final  . Monocytes 03/04/2015 8   Final  . Eos 03/04/2015 2   Final  . Basos 03/04/2015 1   Final  . Neutrophils Absolute 03/04/2015 1.6  1.4 - 7.0 x10E3/uL Final  . Lymphocytes Absolute 03/04/2015 2.4  0.7 - 3.1 x10E3/uL Final  . Monocytes Absolute 03/04/2015 0.4  0.1 - 0.9 x10E3/uL Final  . EOS (ABSOLUTE) 03/04/2015 0.1  0.0 - 0.4 x10E3/uL Final  . Basophils Absolute 03/04/2015 0.1  0.0 - 0.2 x10E3/uL Final  . Immature Granulocytes 03/04/2015 0   Final  . Immature Grans (Abs) 03/04/2015 0.0  0.0 - 0.1 x10E3/uL Final  . Cholesterol, Total 03/04/2015 154  100 - 199 mg/dL Final  . Triglycerides 03/04/2015 64  0 - 149 mg/dL Final  . HDL 95/28/4132 56  >39 mg/dL Final  . VLDL Cholesterol Cal 03/04/2015 13  5 - 40 mg/dL Final  . LDL Calculated 03/04/2015 85  0 - 99 mg/dL Final  . Chol/HDL Ratio 03/04/2015 2.8  0.0 - 4.4 ratio units Final   Comment:                                   T. Chol/HDL Ratio                                             Men  Women                               1/2 Avg.Risk  3.4    3.3                                   Avg.Risk  5.0    4.4                                2X Avg.Risk  9.6    7.1                                3X Avg.Risk 23.4   11.0     No results found.   Assessment/Plan   ICD-9-CM ICD-10-CM   1. Essential  hypertension 401.9 I10 CMP  2. Mixed Alzheimer's and vascular dementia 331.0 G30.9    294.10 F01.50    290.40 F02.80   3. Edema, unspecified type 782.3 R60.9 CMP  4. Hyperlipidemia 272.4 E78.5   5. Protein-calorie malnutrition, moderate (HCC) 263.0 E44.0 CMP  6. Pressure ulcer, stage 1 707.00 L89.91    707.21     Will call with lab results  Continue current medications as ordered  Encourage her to drink ensure or boost daily  Follow  up in 3 mos for routine visit  Tela Kotecki S. Ancil Linsey  [facility] [address] [place], [place] [address] [phone] Cell (Monday-Friday 8 AM - 5 PM) [phone] After 5 PM and follow prompts

## 2015-06-10 NOTE — Patient Instructions (Signed)
Will call with lab results  Continue current medications as ordered  Encourage her to drink ensure or boost daily  Follow up in 3 mos for routine visit

## 2015-06-11 LAB — COMPREHENSIVE METABOLIC PANEL
ALBUMIN: 3.8 g/dL (ref 3.5–4.7)
ALK PHOS: 85 IU/L (ref 39–117)
ALT: 49 IU/L — ABNORMAL HIGH (ref 0–32)
AST: 38 IU/L (ref 0–40)
Albumin/Globulin Ratio: 1.4 (ref 1.1–2.5)
BUN / CREAT RATIO: 17 (ref 11–26)
BUN: 14 mg/dL (ref 8–27)
Bilirubin Total: 0.4 mg/dL (ref 0.0–1.2)
CALCIUM: 9.1 mg/dL (ref 8.7–10.3)
CO2: 22 mmol/L (ref 18–29)
CREATININE: 0.84 mg/dL (ref 0.57–1.00)
Chloride: 103 mmol/L (ref 96–106)
GFR calc Af Amer: 74 mL/min/{1.73_m2} (ref >59)
GFR, EST NON AFRICAN AMERICAN: 64 mL/min/{1.73_m2} (ref >59)
GLOBULIN, TOTAL: 2.7 g/dL (ref 1.5–4.5)
Glucose: 75 mg/dL (ref 65–99)
Potassium: 3.9 mmol/L (ref 3.5–5.2)
SODIUM: 143 mmol/L (ref 134–144)
Total Protein: 6.5 g/dL (ref 6.0–8.5)

## 2015-06-14 ENCOUNTER — Other Ambulatory Visit: Payer: Self-pay | Admitting: *Deleted

## 2015-06-14 DIAGNOSIS — N289 Disorder of kidney and ureter, unspecified: Secondary | ICD-10-CM

## 2015-06-29 ENCOUNTER — Telehealth: Payer: Self-pay

## 2015-06-29 NOTE — Telephone Encounter (Signed)
Message left on triage voicemail, patient woke up with back swelling. Please call  I returned call to [facility], patient woke up and the right middle area of her back was swollen (lasted 30 min). Patient did not have pain, redness or any other symptoms.  Back swelling has now gone down, this was the first occurrence of having swelling. I offered patient an appointment to see NP tomorrow and Adela Lank stated she is not sure if patient needs to be seen since the swelling went down. Adela Lank would like to know what Dr.Carter would recommend  Please advise

## 2015-06-29 NOTE — Telephone Encounter (Signed)
Recommend she be seen in the office

## 2015-06-30 ENCOUNTER — Other Ambulatory Visit: Payer: Self-pay | Admitting: Nurse Practitioner

## 2015-06-30 NOTE — Telephone Encounter (Signed)
Left message on voicemail for patient to return call when available , reason for call: needs to schedule appointment

## 2015-07-01 ENCOUNTER — Other Ambulatory Visit: Payer: Self-pay | Admitting: Nurse Practitioner

## 2015-07-03 ENCOUNTER — Other Ambulatory Visit: Payer: Self-pay | Admitting: Internal Medicine

## 2015-07-04 NOTE — Telephone Encounter (Signed)
Left message to have Shelia to return call when available

## 2015-07-07 NOTE — Telephone Encounter (Signed)
Left message on voicemail for to follow-up on back swelling. I indicated in message if patient still with episodes of back swelling to call for an appointment

## 2015-07-12 ENCOUNTER — Other Ambulatory Visit: Payer: Self-pay | Admitting: Internal Medicine

## 2015-07-13 ENCOUNTER — Other Ambulatory Visit: Payer: Self-pay

## 2015-07-14 ENCOUNTER — Telehealth: Payer: Self-pay

## 2015-07-14 NOTE — Telephone Encounter (Signed)
Nolon Stalls called to say that patients right ankle has been swelling  up for the past three days off and on. she would like to know what she can do for patient to help. Please Advise

## 2015-07-14 NOTE — Telephone Encounter (Signed)
Recommend Tylenol arthritis 2 times daily; keep leg elevated when seated. If no better, may need imaging studies

## 2015-07-15 NOTE — Telephone Encounter (Signed)
Called spoke with son he said he would relay the message to the daughter.

## 2015-08-04 ENCOUNTER — Telehealth: Payer: Self-pay | Admitting: *Deleted

## 2015-08-04 NOTE — Telephone Encounter (Signed)
Stacey Fernandez, caregiver called and stated that patient's ankles have been swelling and they want to know if she can go back to taking a whole tablet of Furosemide instead of 1/2. Please Advise.

## 2015-08-04 NOTE — Telephone Encounter (Signed)
Freda Munro notified and agreed.

## 2015-08-04 NOTE — Telephone Encounter (Signed)
May take 1 tab po daily x 3 days then resume 1/2 tab

## 2015-09-06 ENCOUNTER — Ambulatory Visit (INDEPENDENT_AMBULATORY_CARE_PROVIDER_SITE_OTHER): Payer: Medicare Other | Admitting: Internal Medicine

## 2015-09-06 ENCOUNTER — Encounter: Payer: Self-pay | Admitting: Internal Medicine

## 2015-09-06 VITALS — BP 118/74 | HR 77 | Resp 17 | Ht 60.0 in | Wt 86.4 lb

## 2015-09-06 DIAGNOSIS — L89152 Pressure ulcer of sacral region, stage 2: Secondary | ICD-10-CM | POA: Diagnosis not present

## 2015-09-06 NOTE — Progress Notes (Signed)
Patient ID: Stacey Fernandez, female   DOB: [birth year], 82 y.o.   MRN: [id number]    Facility  [facility]    Place of Service:   OFFICE    No Known Allergies  Chief Complaint  Patient presents with  . Acute Visit    baseball sized sore on right buttock x 1 week, some brownish discharge, no odor  . OTHER    Neice/caregiver, Shelia, in room with pt    HPI:  Patient has a sacral decubitus ulcer that has been present for more than a year. There are areas of chronic scarring. Ulcerated area seemed to be enlarging according to her caretaker. Patient has dementia and spends much of the basic seated or in a recliner. Caretaker says they have been told not to use a cushion with a sacral cut out because it might aggravate the wound. She has been applying Neosporin ointment, but leaving the area uncovered. She was worried about infection.  Medications: Patient's Medications  New Prescriptions   No medications on file  Previous Medications   ASPIRIN 81 MG TABLET    Take 81 mg by mouth daily.   ATORVASTATIN (LIPITOR) 10 MG TABLET    TAKE 1 TABLET BY MOUTH EVERY DAY   FUROSEMIDE (LASIX) 20 MG TABLET    TAKE 0.5 TABLETS (10 MG TOTAL) BY MOUTH DAILY.   LATANOPROST (XALATAN) 0.005 % OPHTHALMIC SOLUTION    PLACE 1 DROP INTO BOTH EYES AT BEDTIME.   MIRTAZAPINE (REMERON) 15 MG TABLET    TAKE 1/2 TABLET BY MOUTH AT BEDTIME.   NAMENDA XR 28 MG CP24 24 HR CAPSULE    TAKE ONE CAPSULE BY MOUTH EVERY DAY   SODIUM CHLORIDE (OCEAN) 0.65 % SOLN NASAL SPRAY    Place 1 spray into both nostrils as needed for congestion.   TRUSOPT 2 % OPHTHALMIC SOLUTION    Place 2 drops into the right eye 2 (two) times daily.   Modified Medications   No medications on file  Discontinued Medications   FUROSEMIDE (LASIX) 20 MG TABLET    TAKE 0.5 TABLETS (10 MG TOTAL) BY MOUTH DAILY.    Review of Systems  Constitutional: Negative for fever, chills and appetite change.       Thin. Frail. Caretaker says she is eating well.  HENT:  Negative for mouth sores, sinus pressure and sore throat.   Eyes: Negative for visual disturbance.  Respiratory: Negative for cough and shortness of breath.   Cardiovascular: Negative for chest pain, palpitations and leg swelling.  Gastrointestinal: Negative for nausea, vomiting, diarrhea and constipation.  Genitourinary: Negative for dysuria.  Musculoskeletal: Negative for joint swelling, arthralgias, gait problem and neck pain.       No use of assisitive device, No falls   Skin: Positive for wound (sacral). Negative for rash.  Neurological: Negative for dizziness, tremors, weakness and light-headedness.       Severe dementia  Psychiatric/Behavioral: Positive for confusion. Negative for behavioral problems, sleep disturbance and agitation.    Filed Vitals:   09/06/15 1500  BP: 118/74  Pulse: 77  Resp: 17  Height: 5' (1.524 m)  Weight: 86 lb 6.4 oz (39.191 kg)  SpO2: 95%   Body mass index is 16.87 kg/(m^2). Filed Weights   09/06/15 1500  Weight: 86 lb 6.4 oz (39.191 kg)     Physical Exam  Constitutional: She appears well-developed.  Frail appearing in NAD.   HENT:  Mouth/Throat: Oropharynx is clear and moist. No oropharyngeal exudate.  Eyes: Pupils are  equal, round, and reactive to light. No scleral icterus.  Neck: Neck supple. Carotid bruit is not present. No tracheal deviation present. No thyromegaly present.  Cardiovascular: Normal rate, regular rhythm, normal heart sounds and intact distal pulses.  Exam reveals no gallop and no friction rub.   No murmur heard. No LE edema b/l. no calf TTP.   Pulmonary/Chest: Effort normal and breath sounds normal. No stridor. No respiratory distress. She has no wheezes. She has no rales.  Abdominal: Soft. Bowel sounds are normal. She exhibits no distension and no mass. There is no hepatomegaly. There is no tenderness. There is no rebound and no guarding.  Musculoskeletal: She exhibits edema.  Lymphadenopathy:    She has no cervical  adenopathy.  Neurological: She is alert.  Significant dementia  Skin: Skin is warm and dry. No rash noted.  Sacral and Right buttock pressure sore has some deterioration since patient was last here. No bleeding or d/c. No redness. No drainage. Mildly tender to palpation.  Psychiatric: She has a normal mood and affect. Her behavior is normal.    Labs reviewed: Lab Summary Latest Ref Rng 06/10/2015 03/04/2015 09/09/2014  Hemoglobin 11.1 - 15.9 g/dL (None) 13.0 (None)  Hematocrit 34.0 - 46.6 % (None) 40.8 (None)  White count 3.4 - 10.8 x10E3/uL (None) 4.5 (None)  Platelet count 150 - 379 x10E3/uL (None) 176 (None)  Sodium 134 - 144 mmol/L 143 145(H) 146(H)  Potassium 3.5 - 5.2 mmol/L 3.9 4.0 3.9  Calcium 8.7 - 10.3 mg/dL 9.1 9.1 8.9  Phosphorus - (None) (None) (None)  Creatinine 0.57 - 1.00 mg/dL 0.84 0.95 0.96  AST 0 - 40 IU/L 38 26 27  Alk Phos 39 - 117 IU/L 85 86 71  Bilirubin 0.0 - 1.2 mg/dL 0.4 0.4 <0.2  Glucose 65 - 99 mg/dL 75 81 85  Cholesterol - (None) (None) (None)  HDL cholesterol >39 mg/dL (None) 56 (None)  Triglycerides 0 - 149 mg/dL (None) 64 (None)  LDL Direct - (None) (None) (None)  LDL Calc 0 - 99 mg/dL (None) 85 (None)  Total protein - (None) (None) (None)  Albumin 3.5 - 4.7 g/dL 3.8 3.7 3.9   Lab Results  Component Value Date   TSH 1.150 03/03/2014   Lab Results  Component Value Date   BUN 14 06/10/2015   BUN 22 03/04/2015   BUN 19 09/09/2014   No results found for: HGBA1C  Assessment/Plan  1. Decubitus ulcer of sacral region, stage 2 Increased breakdown Recommended nonstick bandage applied over Neosporin ointment. Recommended using wedge cushion with sacral cut out. I do not see signs of infection present time.

## 2015-09-06 NOTE — Patient Instructions (Signed)
Continue Neosporin or Triple antibiotic ointment daily. Cover wound with a non stick dressing.  Try to get a cushion with a sacral cut out to protect the area from pressure.

## 2015-09-13 ENCOUNTER — Encounter (HOSPITAL_COMMUNITY): Payer: Self-pay | Admitting: Emergency Medicine

## 2015-09-13 ENCOUNTER — Emergency Department (HOSPITAL_COMMUNITY): Payer: Medicare Other

## 2015-09-13 ENCOUNTER — Inpatient Hospital Stay (HOSPITAL_COMMUNITY)
Admission: EM | Admit: 2015-09-13 | Discharge: 2015-09-17 | DRG: 682 | Disposition: A | Discharge status: Home / Self Care | Payer: Medicare Other | Attending: Internal Medicine | Admitting: Internal Medicine

## 2015-09-13 DIAGNOSIS — N183 Chronic kidney disease, stage 3 unspecified: Secondary | ICD-10-CM | POA: Diagnosis present

## 2015-09-13 DIAGNOSIS — R918 Other nonspecific abnormal finding of lung field: Secondary | ICD-10-CM | POA: Diagnosis present

## 2015-09-13 DIAGNOSIS — R4182 Altered mental status, unspecified: Secondary | ICD-10-CM

## 2015-09-13 DIAGNOSIS — L89152 Pressure ulcer of sacral region, stage 2: Secondary | ICD-10-CM | POA: Diagnosis present

## 2015-09-13 DIAGNOSIS — Z7982 Long term (current) use of aspirin: Secondary | ICD-10-CM

## 2015-09-13 DIAGNOSIS — G309 Alzheimer's disease, unspecified: Secondary | ICD-10-CM | POA: Diagnosis present

## 2015-09-13 DIAGNOSIS — Z681 Body mass index (BMI) 19 or less, adult: Secondary | ICD-10-CM

## 2015-09-13 DIAGNOSIS — N390 Urinary tract infection, site not specified: Secondary | ICD-10-CM | POA: Diagnosis present

## 2015-09-13 DIAGNOSIS — E870 Hyperosmolality and hypernatremia: Secondary | ICD-10-CM | POA: Diagnosis present

## 2015-09-13 DIAGNOSIS — N179 Acute kidney failure, unspecified: Secondary | ICD-10-CM | POA: Diagnosis present

## 2015-09-13 DIAGNOSIS — R627 Adult failure to thrive: Secondary | ICD-10-CM | POA: Diagnosis present

## 2015-09-13 DIAGNOSIS — K5649 Other impaction of intestine: Secondary | ICD-10-CM | POA: Diagnosis present

## 2015-09-13 DIAGNOSIS — E440 Moderate protein-calorie malnutrition: Secondary | ICD-10-CM | POA: Diagnosis present

## 2015-09-13 DIAGNOSIS — I4581 Long QT syndrome: Secondary | ICD-10-CM | POA: Diagnosis present

## 2015-09-13 DIAGNOSIS — Z515 Encounter for palliative care: Secondary | ICD-10-CM | POA: Insufficient documentation

## 2015-09-13 DIAGNOSIS — L89154 Pressure ulcer of sacral region, stage 4: Secondary | ICD-10-CM | POA: Diagnosis not present

## 2015-09-13 DIAGNOSIS — Z66 Do not resuscitate: Secondary | ICD-10-CM | POA: Diagnosis present

## 2015-09-13 DIAGNOSIS — Z87891 Personal history of nicotine dependence: Secondary | ICD-10-CM

## 2015-09-13 DIAGNOSIS — R197 Diarrhea, unspecified: Secondary | ICD-10-CM | POA: Diagnosis present

## 2015-09-13 DIAGNOSIS — I10 Essential (primary) hypertension: Secondary | ICD-10-CM | POA: Diagnosis present

## 2015-09-13 DIAGNOSIS — F05 Delirium due to known physiological condition: Secondary | ICD-10-CM | POA: Diagnosis present

## 2015-09-13 DIAGNOSIS — F0150 Vascular dementia without behavioral disturbance: Secondary | ICD-10-CM | POA: Diagnosis present

## 2015-09-13 DIAGNOSIS — E871 Hypo-osmolality and hyponatremia: Secondary | ICD-10-CM | POA: Diagnosis present

## 2015-09-13 DIAGNOSIS — Z85118 Personal history of other malignant neoplasm of bronchus and lung: Secondary | ICD-10-CM

## 2015-09-13 DIAGNOSIS — K5900 Constipation, unspecified: Secondary | ICD-10-CM

## 2015-09-13 DIAGNOSIS — I5032 Chronic diastolic (congestive) heart failure: Secondary | ICD-10-CM | POA: Diagnosis present

## 2015-09-13 DIAGNOSIS — E860 Dehydration: Secondary | ICD-10-CM | POA: Diagnosis present

## 2015-09-13 DIAGNOSIS — G9341 Metabolic encephalopathy: Secondary | ICD-10-CM | POA: Diagnosis present

## 2015-09-13 DIAGNOSIS — F0280 Dementia in other diseases classified elsewhere without behavioral disturbance: Secondary | ICD-10-CM | POA: Diagnosis present

## 2015-09-13 DIAGNOSIS — K5904 Chronic idiopathic constipation: Secondary | ICD-10-CM | POA: Diagnosis present

## 2015-09-13 DIAGNOSIS — E43 Unspecified severe protein-calorie malnutrition: Secondary | ICD-10-CM | POA: Insufficient documentation

## 2015-09-13 DIAGNOSIS — N182 Chronic kidney disease, stage 2 (mild): Secondary | ICD-10-CM | POA: Diagnosis present

## 2015-09-13 DIAGNOSIS — I130 Hypertensive heart and chronic kidney disease with heart failure and stage 1 through stage 4 chronic kidney disease, or unspecified chronic kidney disease: Secondary | ICD-10-CM | POA: Diagnosis present

## 2015-09-13 DIAGNOSIS — E785 Hyperlipidemia, unspecified: Secondary | ICD-10-CM | POA: Diagnosis present

## 2015-09-13 DIAGNOSIS — I69951 Hemiplegia and hemiparesis following unspecified cerebrovascular disease affecting right dominant side: Secondary | ICD-10-CM

## 2015-09-13 DIAGNOSIS — Z79899 Other long term (current) drug therapy: Secondary | ICD-10-CM

## 2015-09-13 DIAGNOSIS — R9431 Abnormal electrocardiogram [ECG] [EKG]: Secondary | ICD-10-CM | POA: Diagnosis present

## 2015-09-13 DIAGNOSIS — I444 Left anterior fascicular block: Secondary | ICD-10-CM | POA: Diagnosis present

## 2015-09-13 DIAGNOSIS — J449 Chronic obstructive pulmonary disease, unspecified: Secondary | ICD-10-CM | POA: Diagnosis present

## 2015-09-13 HISTORY — DX: Chronic idiopathic constipation: K59.04

## 2015-09-13 HISTORY — DX: Dementia in other diseases classified elsewhere, unspecified severity, without behavioral disturbance, psychotic disturbance, mood disturbance, and anxiety: F01.50

## 2015-09-13 HISTORY — DX: Chronic kidney disease, stage 2 (mild): N18.2

## 2015-09-13 HISTORY — DX: Alzheimer's disease, unspecified: G30.9

## 2015-09-13 HISTORY — DX: Unspecified osteoarthritis, unspecified site: M19.90

## 2015-09-13 HISTORY — DX: Unspecified protein-calorie malnutrition: E46

## 2015-09-13 HISTORY — DX: Malignant neoplasm of unspecified part of unspecified bronchus or lung: C34.90

## 2015-09-13 HISTORY — DX: Pressure ulcer of sacral region, unspecified stage: L89.159

## 2015-09-13 HISTORY — DX: Dementia in other diseases classified elsewhere, unspecified severity, without behavioral disturbance, psychotic disturbance, mood disturbance, and anxiety: F02.80

## 2015-09-13 LAB — COMPREHENSIVE METABOLIC PANEL
ALK PHOS: 123 U/L (ref 38–126)
ALT: 30 U/L (ref 14–54)
AST: 32 U/L (ref 15–41)
Albumin: 3.4 g/dL — ABNORMAL LOW (ref 3.5–5.0)
Anion gap: 10 (ref 5–15)
BUN: 39 mg/dL — AB (ref 6–20)
CHLORIDE: 113 mmol/L — AB (ref 101–111)
CO2: 24 mmol/L (ref 22–32)
CREATININE: 1.4 mg/dL — AB (ref 0.44–1.00)
Calcium: 9.1 mg/dL (ref 8.9–10.3)
GFR calc Af Amer: 39 mL/min — ABNORMAL LOW (ref >60)
GFR calc non Af Amer: 33 mL/min — ABNORMAL LOW (ref >60)
GLUCOSE: 100 mg/dL — AB (ref 65–99)
Potassium: 4.1 mmol/L (ref 3.5–5.1)
SODIUM: 147 mmol/L — AB (ref 135–145)
Total Bilirubin: 0.6 mg/dL (ref 0.3–1.2)
Total Protein: 7.4 g/dL (ref 6.5–8.1)

## 2015-09-13 LAB — CBC WITH DIFFERENTIAL/PLATELET
Basophils Absolute: 0 10*3/uL (ref 0.0–0.1)
Basophils Relative: 0 %
EOS ABS: 0 10*3/uL (ref 0.0–0.7)
EOS PCT: 0 %
HCT: 44.8 % (ref 36.0–46.0)
Hemoglobin: 14.3 g/dL (ref 12.0–15.0)
LYMPHS ABS: 2 10*3/uL (ref 0.7–4.0)
Lymphocytes Relative: 12 %
MCH: 29.2 pg (ref 26.0–34.0)
MCHC: 31.9 g/dL (ref 30.0–36.0)
MCV: 91.6 fL (ref 78.0–100.0)
Monocytes Absolute: 1.7 10*3/uL — ABNORMAL HIGH (ref 0.1–1.0)
Monocytes Relative: 10 %
Neutro Abs: 12.8 10*3/uL — ABNORMAL HIGH (ref 1.7–7.7)
Neutrophils Relative %: 77 %
PLATELETS: 183 10*3/uL (ref 150–400)
RBC: 4.89 MIL/uL (ref 3.87–5.11)
RDW: 14.8 % (ref 11.5–15.5)
WBC: 16.5 10*3/uL — AB (ref 4.0–10.5)

## 2015-09-13 LAB — URINALYSIS, ROUTINE W REFLEX MICROSCOPIC
BILIRUBIN URINE: NEGATIVE
Glucose, UA: NEGATIVE mg/dL
Ketones, ur: NEGATIVE mg/dL
Nitrite: NEGATIVE
PROTEIN: 30 mg/dL — AB
Specific Gravity, Urine: 1.017 (ref 1.005–1.030)
pH: 7.5 (ref 5.0–8.0)

## 2015-09-13 LAB — URINALYSIS, DIPSTICK ONLY
Glucose, UA: NEGATIVE mg/dL
HGB URINE DIPSTICK: NEGATIVE
KETONES UR: 15 mg/dL — AB
Nitrite: NEGATIVE
PROTEIN: NEGATIVE mg/dL
Specific Gravity, Urine: 1.017 (ref 1.005–1.030)
pH: 8 (ref 5.0–8.0)

## 2015-09-13 LAB — URINE MICROSCOPIC-ADD ON

## 2015-09-13 LAB — TSH: TSH: 1.518 u[IU]/mL (ref 0.350–4.500)

## 2015-09-13 LAB — PHOSPHORUS: PHOSPHORUS: 3.1 mg/dL (ref 2.5–4.6)

## 2015-09-13 LAB — MAGNESIUM: MAGNESIUM: 2.4 mg/dL (ref 1.7–2.4)

## 2015-09-13 MED ORDER — THIAMINE HCL 100 MG/ML IJ SOLN
100.0000 mg | Freq: Once | INTRAMUSCULAR | Status: AC
  Administered 2015-09-13: 100 mg via INTRAVENOUS
  Filled 2015-09-13: qty 2

## 2015-09-13 MED ORDER — FLEET ENEMA 7-19 GM/118ML RE ENEM: 1.0000 | ENEMA | Freq: Once | RECTAL | Status: DC

## 2015-09-13 MED ORDER — MUPIROCIN CALCIUM 2 % EX CREA
TOPICAL_CREAM | Freq: Every day | CUTANEOUS | Status: DC
  Administered 2015-09-14 – 2015-09-16 (×3): via TOPICAL
  Administered 2015-09-17: 1 via TOPICAL
  Filled 2015-09-13: qty 15

## 2015-09-13 MED ORDER — SODIUM CHLORIDE 0.9 % IV BOLUS (SEPSIS)
1000.0000 mL | Freq: Once | INTRAVENOUS | Status: AC
  Administered 2015-09-13: 1000 mL via INTRAVENOUS

## 2015-09-13 MED ORDER — ENOXAPARIN SODIUM 30 MG/0.3ML ~~LOC~~ SOLN
30.0000 mg | SUBCUTANEOUS | Status: DC
  Administered 2015-09-13 – 2015-09-16 (×4): 30 mg via SUBCUTANEOUS
  Filled 2015-09-13 (×4): qty 0.3

## 2015-09-13 MED ORDER — BISACODYL 10 MG RE SUPP: 10.0000 mg | Freq: Every day | RECTAL | Status: DC | PRN

## 2015-09-13 MED ORDER — SODIUM CHLORIDE 0.9 % IV SOLN
INTRAVENOUS | Status: DC
  Administered 2015-09-13 – 2015-09-14 (×2): via INTRAVENOUS

## 2015-09-13 MED ORDER — ENOXAPARIN SODIUM 40 MG/0.4ML ~~LOC~~ SOLN: 40.0000 mg | SUBCUTANEOUS | Status: DC

## 2015-09-13 MED ORDER — ACETAMINOPHEN 325 MG PO TABS: 650.0000 mg | ORAL_TABLET | Freq: Four times a day (QID) | ORAL | Status: DC | PRN

## 2015-09-13 MED ORDER — ACETAMINOPHEN 650 MG RE SUPP
650.0000 mg | Freq: Four times a day (QID) | RECTAL | Status: DC | PRN
Start: 2015-09-13 — End: 2015-09-17

## 2015-09-13 NOTE — H&P (Signed)
History and Physical    Stacey Fernandez RCB:[id number] DOB: 01/30/1930 DOA: 09/13/2015   PCP: Gildardo Cranker, DO   Patient coming from/Resides with: Private residence/6 with niece  Chief Complaint: Altered mental status  HPI: Stacey Fernandez is a 82 y.o. female with medical history significant for mixed Alzheimer's and vascular dementia, hypertension, chronic diastolic heart failure on Lasix, chronic kidney disease stage II, functional constipation, dyslipidemia, protein calorie malnutrition and a chronic sacral decubitus who was sent to the ER by her family because of altered mentation. Patient was last evaluated by her primary care physician in 5/23 which time the decubitus was evaluated. A niece was told that the patient's weight has been stable for one year. The patient has been weaker for the past 2 days. She is not having any sick contacts and she has not been on any antibiotics. She was significantly constipated yesterday and the niece had to perform manual disimpaction with patient attempt to pass bowel movement. The niece also reports that for the past several months patient has not been able to get up and get outside and walk as much as previously due to inclement weather. She has not had any nausea and vomiting and her oral intake has been stable except for the past 24 hours but this has been more related to her decreased alertness as opposed to a typical poor oral intake for her.  ED Course:  PO 98.7-BP 144/81-pulse 86-respirations 12-room air saturations 97% Portable chest x-ray: No acute disease CT head without contrast: Atrophic and ischemic changes without acute abnormality Lab data: Sodium 147, potassium 4.1, chloride 113, BUN 39, creatinine 1.4, glucose 100, albumin 3.4, WBCs 16,500 with neutrophils 77%, absolute refills 12.8%, dipstick urinalysis was obtained without microscopy shows small amount of bilirubin Amber color, 15 ketones and small amount of leukocytes. Normal  saline bolus 1 L Thiamine 100 mg IV 1  Review of Systems:  In addition to the HPI above,  No Fever-chills, myalgias or other constitutional symptoms No Headache, changes with Vision or hearing, new weakness, tingling, numbness in any extremity, No problems swallowing food or Liquids, indigestion/reflux No Chest pain, Cough or Shortness of Breath, palpitations, orthopnea or DOE Patient typically takes one half Lasix daily and about one week ago family noticed increased lower extremity swelling so patient took a full Lasix tablet for 3 days No Abdominal pain, N/V; no melena or hematochezia, no dark tarry stools No dysuria, hematuria or flank pain No new skin rashes, lesions, masses or bruises, No new joints pains-aches No recent weight gain or loss No polyuria, polydypsia or polyphagia,   Past Medical History  Diagnosis Date  . Dementia   . Hyperlipidemia   . Lung mass   . COPD (chronic obstructive pulmonary disease) ([place])   . Senile dementia with delirium   . Urinary frequency   . Hemiplegia affecting dominant side, late effect of cerebrovascular disease   . Anxiety state, unspecified   . Chronic airway obstruction, not elsewhere classified   . Other malaise and fatigue   . Memory loss     Past Surgical History  Procedure Laterality Date  . Wedge resection rul nodule  01/10/2011    VAN TRIGT  . Mediastinal lymp node dissection  01/10/2011    VAN TRIGT  . Eye surgery      cataract     reports that she quit smoking about 5 years ago. She has never used smokeless tobacco. She reports that she does not drink alcohol or  use illicit drugs.  Mobility: Walks with assistance of family member and does not utilize walker or cane but has to be completely assisted Work history: Not obtained   No Known Allergies  Family History  Problem Relation Age of Onset  . Heart disease Mother   . Cancer Father   . Heart disease Sister   . Heart disease Brother      Prior to  Admission medications   Medication Sig Start Date End Date Taking? Authorizing Provider  aspirin 81 MG tablet Take 81 mg by mouth daily.   Yes Historical Provider, MD  atorvastatin (LIPITOR) 10 MG tablet TAKE 1 TABLET BY MOUTH EVERY DAY 02/14/15  Yes Lauree Chandler, NP  furosemide (LASIX) 20 MG tablet TAKE 0.5 TABLETS (10 MG TOTAL) BY MOUTH DAILY. 07/01/15  Yes Monica Eulas Post, DO  latanoprost (XALATAN) 0.005 % ophthalmic solution PLACE 1 DROP INTO BOTH EYES AT BEDTIME. 02/15/14  Yes Tiffany L Reed, DO  mirtazapine (REMERON) 15 MG tablet TAKE 1/2 TABLET BY MOUTH AT BEDTIME. 07/04/15  Yes Monica Carter, DO  NAMENDA XR 28 MG CP24 24 hr capsule TAKE ONE CAPSULE BY MOUTH EVERY DAY 07/12/15  Yes Gildardo Cranker, DO  sodium chloride (OCEAN) 0.65 % SOLN nasal spray Place 1 spray into both nostrils as needed for congestion. 07/28/13  Yes Mahima Pandey, MD  TRUSOPT 2 % ophthalmic solution Place 2 drops into the right eye 2 (two) times daily.  08/20/14  Yes Historical Provider, MD    Physical Exam: Filed Vitals:   09/13/15 1315 09/13/15 1330 09/13/15 1415 09/13/15 1530  BP: 149/96 155/96 149/82 148/84  Pulse: 89 93 87 94  Temp:      TempSrc:      Resp: '11 12 13 13  '$ SpO2: 98% 99% 100% 100%      Constitutional: NAD, Not agitated, very lethargic and responds better to family members at bedside than 2 care providers Eyes: PERRL, lids and conjunctivae normal ENMT: Mucous membranes are white dry. Posterior pharynx clear of any exudate or lesions.Normal dentition.  Neck: normal, supple, no masses, no thyromegaly Respiratory: clear to auscultation bilaterally, no wheezing, no crackles. Normal respiratory effort. No accessory muscle use.  Cardiovascular: Regular rate and rhythm, no murmurs / rubs / gallops. No extremity edema. 2+ pedal pulses. No carotid bruits.  Abdomen: no tenderness, no masses palpated. No hepatosplenomegaly. Bowel sounds positive. Incontinent of diarrhea stool Musculoskeletal: no clubbing /  cyanosis. No joint deformity upper and lower extremities. Good ROM, no contractures. Normal muscle tone.  Skin: no rashes, lesions,  No induration-has dressing in place over sacral decubitus but did not remove due to incontinence of fecal material-please see wound care nurses note for details Neurologic: CN 2-12 grossly intact. Sensation intact, unable to accurately test strength given patient's altered mentation but she was noted to be spontaneously but weakly moving all 4 extremities Psychiatric: Patient essentially nonverbal but does attempt to make some eye contact with examiners but due to altered mentation unable to complete this exam accurately   Labs on Admission: I have personally reviewed following labs and imaging studies  CBC:  Recent Labs Lab 09/13/15 1150  WBC 16.5*  NEUTROABS 12.8*  HGB 14.3  HCT 44.8  MCV 91.6  PLT 765   Basic Metabolic Panel:  Recent Labs Lab 09/13/15 1150  NA 147*  K 4.1  CL 113*  CO2 24  GLUCOSE 100*  BUN 39*  CREATININE 1.40*  CALCIUM 9.1   GFR: Estimated Creatinine Clearance: 18.5  mL/min (by C-G formula based on Cr of 1.4). Liver Function Tests:  Recent Labs Lab 09/13/15 1150  AST 32  ALT 30  ALKPHOS 123  BILITOT 0.6  PROT 7.4  ALBUMIN 3.4*   No results for input(s): LIPASE, AMYLASE in the last 168 hours. No results for input(s): AMMONIA in the last 168 hours. Coagulation Profile: No results for input(s): INR, PROTIME in the last 168 hours. Cardiac Enzymes: No results for input(s): CKTOTAL, CKMB, CKMBINDEX, TROPONINI in the last 168 hours. BNP (last 3 results) No results for input(s): PROBNP in the last 8760 hours. HbA1C: No results for input(s): HGBA1C in the last 72 hours. CBG: No results for input(s): GLUCAP in the last 168 hours. Lipid Profile: No results for input(s): CHOL, HDL, LDLCALC, TRIG, CHOLHDL, LDLDIRECT in the last 72 hours. Thyroid Function Tests: No results for input(s): TSH, T4TOTAL, FREET4, T3FREE,  THYROIDAB in the last 72 hours. Anemia Panel: No results for input(s): VITAMINB12, FOLATE, FERRITIN, TIBC, IRON, RETICCTPCT in the last 72 hours. Urine analysis:    Component Value Date/Time   COLORURINE AMBER* 09/13/2015 1250   APPEARANCEUR TURBID* 09/13/2015 1250   LABSPEC 1.017 09/13/2015 1250   PHURINE 8.0 09/13/2015 1250   GLUCOSEU NEGATIVE 09/13/2015 1250   HGBUR NEGATIVE 09/13/2015 1250   BILIRUBINUR SMALL* 09/13/2015 1250   KETONESUR 15* 09/13/2015 1250   PROTEINUR NEGATIVE 09/13/2015 1250   UROBILINOGEN 1.0 10/31/2010 1241   NITRITE NEGATIVE 09/13/2015 1250   LEUKOCYTESUR SMALL* 09/13/2015 1250   Sepsis Labs: '@LABRCNTIP'$ (procalcitonin:4,lacticidven:4) )No results found for this or any previous visit (from the past 240 hour(s)).   Radiological Exams on Admission: Ct Head Wo Contrast  09/13/2015  CLINICAL DATA:  Unresponsive EXAM: CT HEAD WITHOUT CONTRAST TECHNIQUE: Contiguous axial images were obtained from the base of the skull through the vertex without intravenous contrast. COMPARISON:  None. FINDINGS: Bony calvarium is intact. Atrophic changes are noted. Mild chronic white matter ischemic changes noted as well. No findings to suggest acute hemorrhage, acute infarction or space-occupying mass lesion are noted. IMPRESSION: Atrophic and ischemic changes without acute abnormality. Electronically Signed   By: Inez Catalina M.D.   On: 09/13/2015 13:50   Dg Chest Port 1 View  09/13/2015  CLINICAL DATA:  Altered mental status.  COPD EXAM: PORTABLE CHEST 1 VIEW COMPARISON:  Chest CT 01/20/2014 FINDINGS: Chronic cardiomegaly.  Negative aortic and hilar contours. Skin folds on the right. Postsurgical right lung with apical chain sutures. There is no edema, consolidation, effusion, or pneumothorax. No acute osseous finding. IMPRESSION: No evidence of acute cardiopulmonary disease. Electronically Signed   By: Monte Fantasia M.D.   On: 09/13/2015 12:46   Dg Abd Portable 1v  09/13/2015   CLINICAL DATA:  Constipation EXAM: PORTABLE ABDOMEN - 1 VIEW COMPARISON:  None. FINDINGS: Scattered large and small bowel gas is noted. No obstructive changes are seen. Fecal material is noted within the right colon as well as within the rectal wall. This may represent an early impaction. No free air is seen. No bony abnormality is noted. IMPRESSION: Possible early fecal impaction in the rectum. Electronically Signed   By: Inez Catalina M.D.   On: 09/13/2015 14:37    EKG: (Independently reviewed) sinus rhythm with ventricular rate 92 bpm, QTC 539 ms which is a new finding; otherwise without acute ischemic changes  Assessment/Plan Principal Problem:   AKI (acute kidney injury) / CKD (chronic kidney disease), stage II -Suspect multifactorial etiology: Diarrhea secondary to severe constipation, recent increase in baseline diuretic dosage  and poor oral intake for the past 24 hours -Baseline renal function BUN 14 and creatinine 0.84 with current BUN 39 and creatinine 1.40 -Hydrate and hold Lasix -Follow labs  Active Problems:   Dehydration with hypernatremia -See above -IV fluid rehydration -Labs in a.m.    Metabolic encephalopathy/ Mixed Alzheimer's and vascular dementia -Presumed secondary to hyponatremia; no obvious source of infection identified -NPO until alert enough to swallow -Minor baseline deconditioning over the past few months so PT and OT evaluation this admission    Prolonged Q-T interval on ECG -Not on offending medications -Repeat EKG in a.m.    Essential hypertension -Only on Lasix prior to admission -May need to consider other medications to treat hypertension at time of discharge    Decubitus ulcer of sacral region, stage 4 -Recommendations for wound care made by [facility] RN -Air mattress here -Asked case manager to assist in obtaining hospital bed and air mattress for use at home -Wheelchair cushion     Chronic diastolic heart failure, NYHA class 1  -Appears  compensated -May need to consider changing diuretic to prn after discharge    Hyperlipidemia -Continue Lipitor    Protein-calorie malnutrition, moderate -Nutrition consultation     Functional constipation with Diarrhea -Check abdominal x-ray: This revealed significant constipation with rectal impaction -Orders for RN to disimpact -Fleet enema 1 -Dulcolax suppository when necessary -Once patient more alert we'll need to begin oral bowel regimen with at least twice a day Colace and hour of sleep MiraLAX -Lasix may be contributing      DVT prophylaxis: Lovenox  Code Status: DO NOT RESUSCITATE Family Communication: Mable Fill at the bedside Disposition Plan: Anticipate back to previous home environment with recommendations made for home health supplies Consults called: Palliative medicine (per EDP) Admission status: Observation/floor    ELLIS,ALLISON L. ANP-BC [facility] Pager [phone]   If 7PM-7AM, please contact night-coverage [email or web address] Password [email or web address]  09/13/2015, 4:12 PM

## 2015-09-13 NOTE — ED Provider Notes (Addendum)
CSN: [id number]     Arrival date & time 09/13/15  1139 History   First MD Initiated Contact with Patient 09/13/15 1143     Chief Complaint  Patient presents with  . Altered Mental Status     (Consider location/radiation/quality/duration/timing/severity/associated sxs/prior Treatment) Patient is a 82 y.o. female presenting with altered mental status.  Altered Mental Status  Level V caveat dementia history is obtained from her brother who is health care power of attorney and from her niece who is her caregiver. Patient was less responsive onset this morning. She was unable to feed herself this morning unable to ambulate. She's had multiple episodes of diarrhea starting yesterday. She was brought by EMS. No treatment prior to coming here. No other associated symptoms Past Medical History  Diagnosis Date  . Dementia   . Hyperlipidemia   . Lung mass   . COPD (chronic obstructive pulmonary disease) ([place])   . Senile dementia with delirium   . Urinary frequency   . Hemiplegia affecting dominant side, late effect of cerebrovascular disease   . Anxiety state, unspecified   . Chronic airway obstruction, not elsewhere classified   . Other malaise and fatigue   . Memory loss    Past Surgical History  Procedure Laterality Date  . Wedge resection rul nodule  01/10/2011    VAN TRIGT  . Mediastinal lymp node dissection  01/10/2011    VAN TRIGT  . Eye surgery      cataract   Family History  Problem Relation Age of Onset  . Heart disease Mother   . Cancer Father   . Heart disease Sister   . Heart disease Brother    Social History  Substance Use Topics  . Smoking status: Former Smoker    Quit date: 06/09/2010  . Smokeless tobacco: Never Used  . Alcohol Use: No   OB History    No data available     Review of Systems  Gastrointestinal: Positive for diarrhea.  Musculoskeletal: Positive for gait problem.       Walks with walker however was able to ambulate yesterday, unassisted   Skin: Positive for wound.       Decubitus ulcer on left buttock  Neurological:       Less responsive      Allergies  Review of patient's allergies indicates no known allergies.  Home Medications   Prior to Admission medications   Medication Sig Start Date End Date Taking? Authorizing Provider  aspirin 81 MG tablet Take 81 mg by mouth daily.    Historical Provider, MD  atorvastatin (LIPITOR) 10 MG tablet TAKE 1 TABLET BY MOUTH EVERY DAY 02/14/15   Lauree Chandler, NP  furosemide (LASIX) 20 MG tablet TAKE 0.5 TABLETS (10 MG TOTAL) BY MOUTH DAILY. 07/01/15   Monica Eulas Post, DO  latanoprost (XALATAN) 0.005 % ophthalmic solution PLACE 1 DROP INTO BOTH EYES AT BEDTIME. 02/15/14   Tiffany L Reed, DO  mirtazapine (REMERON) 15 MG tablet TAKE 1/2 TABLET BY MOUTH AT BEDTIME. 07/04/15   Gildardo Cranker, DO  NAMENDA XR 28 MG CP24 24 hr capsule TAKE ONE CAPSULE BY MOUTH EVERY DAY 07/12/15   Gildardo Cranker, DO  sodium chloride (OCEAN) 0.65 % SOLN nasal spray Place 1 spray into both nostrils as needed for congestion. 07/28/13   Blanchie Serve, MD  TRUSOPT 2 % ophthalmic solution Place 2 drops into the right eye 2 (two) times daily.  08/20/14   Historical Provider, MD   BP 156/91 mmHg  Pulse  82  Temp(Src) 98.7 F (37.1 C) (Oral)  Resp 12  SpO2 98% Physical Exam  Constitutional:  Chronically ill-appearing  HENT:  Head: Normocephalic and atraumatic.  Mucous membranes dry. No mucosal lesion  Eyes: Conjunctivae are normal. Pupils are equal, round, and reactive to light.  Neck: Neck supple. No tracheal deviation present. No thyromegaly present.  Cardiovascular: Normal rate and regular rhythm.   No murmur heard. Pulmonary/Chest: Effort normal and breath sounds normal.  Abdominal: Soft. Bowel sounds are normal. She exhibits no distension. There is no tenderness.  Musculoskeletal: Normal range of motion. She exhibits no edema or tenderness.  Neurological: Coordination normal.  Nonverbal, follows simple  commands. Gag reflex intact. Moves all extremities.  Skin: Skin is warm and dry. No rash noted.  Silver dollar sized decubitus ulcer at left buttock, clean appearing  Psychiatric: She has a normal mood and affect.  Nursing note and vitals reviewed.   ED Course  Procedures (including critical care time) Labs Review Labs Reviewed - No data to display  Imaging Review No results found. I have personally reviewed and evaluated these images and lab results as part of my medical decision-making.   EKG Interpretation   Date/Time:  Tuesday Sep 13 2015 11:43:04 EDT Ventricular Rate:  92 PR Interval:  136 QRS Duration: 94 QT Interval:  436 QTC Calculation: 539 R Axis:   -59 Text Interpretation:  Sinus rhythm Left anterior fascicular block Anterior  infarct, old Prolonged QT interval No significant change since last  tracing Confirmed by Winfred Leeds  MD, Amere Bricco [phone]) on 09/13/2015 12:05:02 PM      Chest x-ray viewed by me Results for orders placed or performed during the hospital encounter of 09/13/15  Comprehensive metabolic panel  Result Value Ref Range   Sodium 147 (H) 135 - 145 mmol/L   Potassium 4.1 3.5 - 5.1 mmol/L   Chloride 113 (H) 101 - 111 mmol/L   CO2 24 22 - 32 mmol/L   Glucose, Bld 100 (H) 65 - 99 mg/dL   BUN 39 (H) 6 - 20 mg/dL   Creatinine, Ser 1.40 (H) 0.44 - 1.00 mg/dL   Calcium 9.1 8.9 - 10.3 mg/dL   Total Protein 7.4 6.5 - 8.1 g/dL   Albumin 3.4 (L) 3.5 - 5.0 g/dL   AST 32 15 - 41 U/L   ALT 30 14 - 54 U/L   Alkaline Phosphatase 123 38 - 126 U/L   Total Bilirubin 0.6 0.3 - 1.2 mg/dL   GFR calc non Af Amer 33 (L) >60 mL/min   GFR calc Af Amer 39 (L) >60 mL/min   Anion gap 10 5 - 15  CBC with Differential/Platelet  Result Value Ref Range   WBC 16.5 (H) 4.0 - 10.5 K/uL   RBC 4.89 3.87 - 5.11 MIL/uL   Hemoglobin 14.3 12.0 - 15.0 g/dL   HCT 44.8 36.0 - 46.0 %   MCV 91.6 78.0 - 100.0 fL   MCH 29.2 26.0 - 34.0 pg   MCHC 31.9 30.0 - 36.0 g/dL   RDW 14.8 11.5 -  15.5 %   Platelets 183 150 - 400 K/uL   Neutrophils Relative % 77 %   Neutro Abs 12.8 (H) 1.7 - 7.7 K/uL   Lymphocytes Relative 12 %   Lymphs Abs 2.0 0.7 - 4.0 K/uL   Monocytes Relative 10 %   Monocytes Absolute 1.7 (H) 0.1 - 1.0 K/uL   Eosinophils Relative 0 %   Eosinophils Absolute 0.0 0.0 - 0.7 K/uL  Basophils Relative 0 %   Basophils Absolute 0.0 0.0 - 0.1 K/uL  Urinalysis, dipstick only  Result Value Ref Range   Color, Urine AMBER (A) YELLOW   APPearance TURBID (A) CLEAR   Specific Gravity, Urine 1.017 1.005 - 1.030   pH 8.0 5.0 - 8.0   Glucose, UA NEGATIVE NEGATIVE mg/dL   Hgb urine dipstick NEGATIVE NEGATIVE   Bilirubin Urine SMALL (A) NEGATIVE   Ketones, ur 15 (A) NEGATIVE mg/dL   Protein, ur NEGATIVE NEGATIVE mg/dL   Nitrite NEGATIVE NEGATIVE   Leukocytes, UA SMALL (A) NEGATIVE   Ct Head Wo Contrast  09/13/2015  CLINICAL DATA:  Unresponsive EXAM: CT HEAD WITHOUT CONTRAST TECHNIQUE: Contiguous axial images were obtained from the base of the skull through the vertex without intravenous contrast. COMPARISON:  None. FINDINGS: Bony calvarium is intact. Atrophic changes are noted. Mild chronic white matter ischemic changes noted as well. No findings to suggest acute hemorrhage, acute infarction or space-occupying mass lesion are noted. IMPRESSION: Atrophic and ischemic changes without acute abnormality. Electronically Signed   By: Inez Catalina M.D.   On: 09/13/2015 13:50   Dg Chest Port 1 View  09/13/2015  CLINICAL DATA:  Altered mental status.  COPD EXAM: PORTABLE CHEST 1 VIEW COMPARISON:  Chest CT 01/20/2014 FINDINGS: Chronic cardiomegaly.  Negative aortic and hilar contours. Skin folds on the right. Postsurgical right lung with apical chain sutures. There is no edema, consolidation, effusion, or pneumothorax. No acute osseous finding. IMPRESSION: No evidence of acute cardiopulmonary disease. Electronically Signed   By: Monte Fantasia M.D.   On: 09/13/2015 12:46   Intravenous  fluids ordered MDM  Patient is clinically dehydrated has acute kidney injury in comparison to old laboratory values from 3 months ago Case discussed with hospitalist service who will make arrangements for 23 hour observation to med surge floor Final diagnoses:  None   Diagnosis #1 altered mental status #2 dehydration #3 hyponatremia #4 acute kidney injury     Orlie Dakin, MD 09/13/15 1427  Orlie Dakin, MD 09/13/15 1430

## 2015-09-13 NOTE — Progress Notes (Signed)
NURSING PROGRESS NOTE  [name] [id number] Admission Data: 09/13/2015 7:00 PM Attending Provider: Waldemar Dickens, MD JZP:[id number], Brayton Layman, DO Code Status: DNR   Stacey Fernandez is a 82 y.o. female patient admitted from ED:  -No acute distress noted.  -No complaints of shortness of breath.  -No complaints of chest pain.   Cardiac Monitoring: none  Blood pressure 126/88, pulse 97, temperature 98.7 F (37.1 C), temperature source Oral, resp. rate 12, SpO2 100 %.   IV Fluids:  IV in place, occlusive dsg intact without redness, IV cath antecubital left,  As well as a LFA IV condition patent and no redness normal saline.   Allergies:  Review of patient's allergies indicates no known allergies.  Past Medical History:   has a past medical history of Dementia; Hyperlipidemia; Lung mass; COPD (chronic obstructive pulmonary disease) ([place]); Senile dementia with delirium; Urinary frequency; Hemiplegia affecting dominant side, late effect of cerebrovascular disease; Anxiety state, unspecified; Chronic airway obstruction, not elsewhere classified; Other malaise and fatigue; and Memory loss. FAMILY AT bedside.  Past Surgical History:   has past surgical history that includes WEDGE RESECTION RUL NODULE (01/10/2011); mediastinal lymp node dissection (01/10/2011); and Eye surgery.  Social History:   reports that she quit smoking about 5 years ago. She has never used smokeless tobacco. She reports that she does not drink alcohol or use illicit drugs.  Skin: stage 4 to sacrum  Patient/Family orientated to room. Information packet given to patient/family. Admission inpatient armband information verified with patient/family to include name and date of birth and placed on patient arm. Side rails up x 2, fall assessment and education completed with patient/family. Patient/family able to verbalize understanding of risk associated with falls and verbalized understanding to call for assistance before  getting out of bed. Call light within reach. Patient/family able to voice and demonstrate understanding of unit orientation instructions.    Will continue to evaluate and treat per MD orders.

## 2015-09-13 NOTE — ED Notes (Signed)
Per [facility] patient from home with family for altered mental status.  Patient was found unresponsive at 0930 this morning, laying in bed with her eyes and mouth open.  Patient was last seen normal last night at 2000.  Patient has a history of dementia and a has a decubitus ulcer.

## 2015-09-13 NOTE — Consult Note (Addendum)
WOC wound consult note Reason for Consult: Consult requested for sacrum in the ER.  Family member at the bedside states she cares for this chronic wound at home; "it will get better for awhile, then get worse again."  She had a physician assess the site last week and they ordered antibiotic ointment and covering it with a dressing.  Pt has multiple systemic factors which can impair healing; frequently incontinent of liquid stool, very emeciated and immobile. Wound type: Chronic stage 4 pressure injury; family member states it has been present several months Pressure Ulcer POA: Yes Measurement: 6X3X.2cm Wound bed: 90% red, 10% yellow, bone palpable when probed with a swab Drainage (amount, consistency, odor) Scant amt yellow drainage, no odor Periwound: Macerated skin surrounding; appearance consistent with moisture associated skin damage Dressing procedure/placement/frequency: Continue present plan of care with antibiotic ointment and foam dressing to protect from further injury.  It is difficult to keep wound from becoming soiled related to frequent diarrhea. Nutrition consult to optimize protein intake.  Air mattress to reduce pressure.  Discussed plan of care with family member at the bedside, they deny further questions. Please re-consult if further assistance is needed.  Thank-you,  Julien Girt MSN, [place], [place], [place], [place]

## 2015-09-14 ENCOUNTER — Ambulatory Visit: Payer: Medicare Other | Admitting: Internal Medicine

## 2015-09-14 DIAGNOSIS — I5032 Chronic diastolic (congestive) heart failure: Secondary | ICD-10-CM

## 2015-09-14 DIAGNOSIS — N182 Chronic kidney disease, stage 2 (mild): Secondary | ICD-10-CM

## 2015-09-14 DIAGNOSIS — E43 Unspecified severe protein-calorie malnutrition: Secondary | ICD-10-CM | POA: Insufficient documentation

## 2015-09-14 DIAGNOSIS — N179 Acute kidney failure, unspecified: Secondary | ICD-10-CM | POA: Diagnosis not present

## 2015-09-14 DIAGNOSIS — G309 Alzheimer's disease, unspecified: Secondary | ICD-10-CM | POA: Diagnosis not present

## 2015-09-14 LAB — COMPREHENSIVE METABOLIC PANEL
ALBUMIN: 2.8 g/dL — AB (ref 3.5–5.0)
ALT: 24 U/L (ref 14–54)
AST: 27 U/L (ref 15–41)
Alkaline Phosphatase: 96 U/L (ref 38–126)
Anion gap: 7 (ref 5–15)
BILIRUBIN TOTAL: 0.7 mg/dL (ref 0.3–1.2)
BUN: 26 mg/dL — AB (ref 6–20)
CO2: 22 mmol/L (ref 22–32)
Calcium: 8.5 mg/dL — ABNORMAL LOW (ref 8.9–10.3)
Chloride: 117 mmol/L — ABNORMAL HIGH (ref 101–111)
Creatinine, Ser: 0.9 mg/dL (ref 0.44–1.00)
GFR calc Af Amer: >60 mL/min (ref >60)
GFR calc non Af Amer: 57 mL/min — ABNORMAL LOW (ref >60)
GLUCOSE: 77 mg/dL (ref 65–99)
POTASSIUM: 3.9 mmol/L (ref 3.5–5.1)
Sodium: 146 mmol/L — ABNORMAL HIGH (ref 135–145)
TOTAL PROTEIN: 6.3 g/dL — AB (ref 6.5–8.1)

## 2015-09-14 LAB — CBC
HEMATOCRIT: 40.2 % (ref 36.0–46.0)
Hemoglobin: 12.5 g/dL (ref 12.0–15.0)
MCH: 28.6 pg (ref 26.0–34.0)
MCHC: 31.1 g/dL (ref 30.0–36.0)
MCV: 92 fL (ref 78.0–100.0)
Platelets: 180 10*3/uL (ref 150–400)
RBC: 4.37 MIL/uL (ref 3.87–5.11)
RDW: 14.9 % (ref 11.5–15.5)
WBC: 14.3 10*3/uL — ABNORMAL HIGH (ref 4.0–10.5)

## 2015-09-14 LAB — GLUCOSE, CAPILLARY: Glucose-Capillary: 143 mg/dL — ABNORMAL HIGH (ref 65–99)

## 2015-09-14 MED ORDER — DORZOLAMIDE HCL 2 % OP SOLN
2.0000 [drp] | Freq: Two times a day (BID) | OPHTHALMIC | Status: DC
  Administered 2015-09-14 – 2015-09-17 (×7): 2 [drp] via OPHTHALMIC
  Filled 2015-09-14: qty 10

## 2015-09-14 MED ORDER — ADULT MULTIVITAMIN W/MINERALS CH
1.0000 | ORAL_TABLET | Freq: Every day | ORAL | Status: DC
  Administered 2015-09-15 – 2015-09-17 (×3): 1 via ORAL
  Filled 2015-09-14 (×3): qty 1

## 2015-09-14 MED ORDER — BOOST / RESOURCE BREEZE PO LIQD
1.0000 | Freq: Two times a day (BID) | ORAL | Status: DC
  Administered 2015-09-16 – 2015-09-17 (×2): 1 via ORAL

## 2015-09-14 MED ORDER — MEMANTINE HCL ER 28 MG PO CP24
28.0000 mg | ORAL_CAPSULE | Freq: Every day | ORAL | Status: DC
  Administered 2015-09-14 – 2015-09-17 (×4): 28 mg via ORAL
  Filled 2015-09-14 (×4): qty 1

## 2015-09-14 MED ORDER — ATORVASTATIN CALCIUM 10 MG PO TABS
10.0000 mg | ORAL_TABLET | Freq: Every day | ORAL | Status: DC
  Administered 2015-09-14 – 2015-09-17 (×4): 10 mg via ORAL
  Filled 2015-09-14 (×4): qty 1

## 2015-09-14 MED ORDER — SALINE SPRAY 0.65 % NA SOLN
1.0000 | NASAL | Status: DC | PRN
  Filled 2015-09-14: qty 44

## 2015-09-14 MED ORDER — CEFTRIAXONE SODIUM 1 G IJ SOLR
1.0000 g | INTRAMUSCULAR | Status: DC
  Administered 2015-09-14 – 2015-09-16 (×3): 1 g via INTRAVENOUS
  Filled 2015-09-14 (×4): qty 10

## 2015-09-14 MED ORDER — ENSURE ENLIVE PO LIQD
237.0000 mL | ORAL | Status: DC
  Administered 2015-09-15: 237 mL via ORAL

## 2015-09-14 MED ORDER — SENNOSIDES-DOCUSATE SODIUM 8.6-50 MG PO TABS
2.0000 | ORAL_TABLET | Freq: Every day | ORAL | Status: DC
  Administered 2015-09-14: 2 via ORAL
  Filled 2015-09-14 (×2): qty 2

## 2015-09-14 MED ORDER — LATANOPROST 0.005 % OP SOLN
1.0000 [drp] | Freq: Every day | OPHTHALMIC | Status: DC
  Administered 2015-09-14 – 2015-09-16 (×3): 1 [drp] via OPHTHALMIC
  Filled 2015-09-14 (×2): qty 2.5

## 2015-09-14 MED ORDER — DEXTROSE 5 % IV SOLN
INTRAVENOUS | Status: DC
  Administered 2015-09-14 – 2015-09-15 (×2): via INTRAVENOUS

## 2015-09-14 MED ORDER — POLYETHYLENE GLYCOL 3350 17 G PO PACK: 17.0000 g | PACK | Freq: Every day | ORAL | Status: DC | PRN

## 2015-09-14 NOTE — Care Management Note (Signed)
Case Management Note  Patient Details  Name: Stacey Fernandez MRN: [id number] Date of Birth: 08/28/1929  Subjective/Objective:       Presents with physical deconditioning and worsening of baseline dementia, and sacral decubitus ulcer,  history of dementia, HLD, lung mass, COPD, CVA. Resides with brother Rusty Aus. Requires max assist with ADL 's . Receives PCS everyday from family(Sheila) 9am-4pm.   Action/Plan: Palliative care consult  Expected Discharge Date:                  Expected Discharge Plan:  [place]  In-House Referral:     Discharge planning Services  CM Consult  Post Acute Care Choice:    Choice offered to:     DME Arranged:  Hospital bed, Air overlay mattress, wheelchair, Corporate investment banker DME Agency:  [place]. [place], [phone])  [place] Arranged:    [facility] Agency:     Status of Service:  In process, will continue to follow  Medicare Important Message Given:    Date Medicare IM Given:    Medicare IM give by:    Date Additional Medicare IM Given:    Additional Medicare Important Message give by:     If discussed at [place] of Stay Meetings, dates discussed:    Additional Comments: Ferd Hibbs ([address] (Sister)  [phone]  [place], [place] [phone] 09/14/2015, 3:28 PM

## 2015-09-14 NOTE — Evaluation (Signed)
Occupational Therapy Evaluation and Discharge Patient Details Name: KEITA DEMARCO MRN: [id number] DOB: 1930/02/18 Today's Date: 09/14/2015    History of Present Illness 82 y.o. female with medical history significant for mixed Alzheimer's and vascular dementia, hypertension, chronic diastolic heart failure on Lasix, chronic kidney disease stage II, functional constipation, dyslipidemia, protein calorie malnutrition and a chronic sacral decubitus (chronic x 1 year) who was sent to the ER by her family because of altered mentation.  Dx of AKI, dehydration, sacral decubitus stage 4, encephalopathy.    Clinical Impression   Pt is able to self feed at baseline and walk with B hands held, otherwise dependent in self care. Pt presents with weakness, impaired cognition and low vision. She required total assist for clean up following bowel incontinence upon standing. Family wishes to take pt home and provides 24 hour capable assistance for pt. No OT needs. Signing off.   Follow Up Recommendations  No OT follow up;Supervision/Assistance - 24 hour    Equipment Recommendations  Tub/shower seat    Recommendations for Other Services       Precautions / Restrictions Precautions Precautions: Fall Precaution Comments: family reports no h/o falls Restrictions Weight Bearing Restrictions: No      Mobility Bed Mobility Overal bed mobility: Needs Assistance Bed Mobility: Supine to Sit     Supine to sit: Mod assist     General bed mobility comments: assist to raise trunk and advance BLEs  Transfers Overall transfer level: Needs assistance Equipment used: 1 person hand held assist Transfers: Sit to/from Stand Sit to Stand: Mod assist         General transfer comment: assist to rise    Balance Overall balance assessment: Needs assistance Sitting-balance support: Bilateral upper extremity supported;Feet supported Sitting balance-Leahy Scale: Fair       Standing balance-Leahy  Scale: Fair                              ADL Overall ADL's : Needs assistance/impaired                                     Functional mobility during ADLs: Moderate assistance General ADL Comments: total assist, pt with bowel incontinence, performed clean up and changed clothing     Vision Additional Comments: per sister, pt does not see very well, no apparent functional vision noted   Perception     Praxis      Pertinent Vitals/Pain Pain Assessment: Faces Faces Pain Scale: No hurt     Hand Dominance     Extremity/Trunk Assessment Upper Extremity Assessment Upper Extremity Assessment: Generalized weakness;RUE deficits/detail RUE Deficits / Details: flexion contracture of second finger   Lower Extremity Assessment Lower Extremity Assessment: Defer to PT evaluation   Cervical / Trunk Assessment Cervical / Trunk Assessment: Kyphotic   Communication Communication Communication: Receptive difficulties;Expressive difficulties (2* dementia)   Cognition Arousal/Alertness: Awake/alert Behavior During Therapy: Flat affect Overall Cognitive Status: History of cognitive impairments - at baseline (not oriented, does not follow commands, poor historian)                     General Comments       Exercises       Shoulder Instructions      Home Living Family/patient expects to be discharged to:: Private residence Living Arrangements: Other (Comment) (brother) Available  Help at Discharge: Family;Personal care attendant;Available 24 hours/day   Home Access: Stairs to enter Entrance Stairs-Number of Steps: 1   Home Layout: One level     Bathroom Shower/Tub: Teacher, early years/pre: Standard     Home Equipment: Bedside commode;Walker - 2 wheels;Hand held shower head   Additional Comments: 2 caregivers daily, lives with brother      Prior Functioning/Environment Level of Independence: Needs assistance  Gait /  Transfers Assistance Needed: walks slowly with hand held assistance, sometimes with RW usually without; assist with stair, no falls ADL's / Homemaking Assistance Needed: assistance from caregiver, can feed self with encouragement, sat down in tub, needs shower chair        OT Diagnosis: Generalized weakness;Cognitive deficits;Blindness and low vision   OT Problem List:     OT Treatment/Interventions:      OT Goals(Current goals can be found in the care plan section) Acute Rehab OT Goals Patient Stated Goal: return home  OT Frequency:     Barriers to D/C:            Co-evaluation              End of Session Nurse Communication: Mobility status  Activity Tolerance: Patient tolerated treatment well Patient left: in chair;with call bell/phone within reach;with chair alarm set   Time: 1025-1110 OT Time Calculation (min): 45 min Charges:  OT General Charges $OT Visit: 1 Procedure OT Evaluation $OT Eval Moderate Complexity: 1 Procedure G-Codes: OT G-codes **NOT FOR INPATIENT CLASS** Functional Assessment Tool Used: clinical judgement Functional Limitation: Self care Self Care Current Status (I1443): 100 percent impaired, limited or restricted Self Care Goal Status (X5400): 100 percent impaired, limited or restricted Self Care Discharge Status (Q6761): 100 percent impaired, limited or restricted  Malka So 09/14/2015, 11:47 AM  [phone]

## 2015-09-14 NOTE — Progress Notes (Signed)
Initial Nutrition Assessment  DOCUMENTATION CODES:   Underweight, Severe malnutrition in context of chronic illness  INTERVENTION:   -Ensure Enlive po daily, each supplement provides 350 kcal and 20 grams of protein -Boost Breeze po BID, each supplement provides 250 kcal and 9 grams of protein -MVI daily  NUTRITION DIAGNOSIS:   Malnutrition related to chronic illness as evidenced by severe depletion of body fat, severe depletion of muscle mass.  GOAL:   Patient will meet greater than or equal to 90% of their needs  MONITOR:   PO intake, Supplement acceptance, Labs, Weight trends, Skin, I & O's  REASON FOR ASSESSMENT:   Consult, [place] Assessment of nutrition requirement/status  ASSESSMENT:   Stacey Fernandez is a 82 y.o. female with a Past Medical History of dementia, HLD, lung mass, COPD, CVA, who presents with physical deconditioning and worsening of baseline dementia. Agree w/ palliative care consult placed by ED for [place] discussion. Of note pts Sacral decubitus ulcer has been present for 1 year and appears near baseline per my visualization of wound and discussion w/ pts care giver.  Pt admitted with AKI and AMS.   Hx obtained by pt's niece and sister at bedside. They report that pt's appetite is generally very good, although they acknowledge that pt has not been alert enough to take PO's earlier today. Pt typically consumes 3 meals per day (Breakfast: cereal, oatmeal or eggs, lunch: sandwich, dinner: meat, starch, vegetable). Pt also snacks on cookies, crackers, and chips. Pt niece reports that pt has tried Ensure supplements in the past, but were discontinued about 3 months ago due to diarrhea. She reports that she tolerated Ensure Enlive better than standard Ensure, but also inquired about Boost Breeze. She reports pt does not take any vitamins and minerals to support wound healing.   Both confirm chronic stage IV pressure injury to sacrum, which was confirmed by  [facility] notes. Per pt family, wound care is managed by PCP.   Wt hx reviewed. UBW around 89#. Pt niece confirms that pt's weight has stabilized over the past 1-2 years, which is consistent with wt hx.   Nutrition-Focused physical exam completed. Findings are severe fat depletion, severe muscle depletion, and no edema.   Discussed importance of increased protein intake to support wound healing. Discussed ways pt could include more protein in her diet and discussed available supplements. Pt would like to try both Ensure and Boost Breeze to optimize nutritional intake and increase variety.   Noted palliative care consult pending for [place] discussion.   Labs reviewed: Na: 146.   Diet Order:  Diet regular Room service appropriate?: Yes; Fluid consistency:: Thin  Skin:  Wound (see comment) (stage IV sacrum)  Last BM:  09/14/15  Height:   Ht Readings from Last 1 Encounters:  09/06/15 5' (1.524 m)    Weight:   Wt Readings from Last 1 Encounters:  09/14/15 89 lb (40.37 kg)    Ideal Body Weight:  45.5 kg  BMI:  Body mass index is 17.38 kg/(m^2).  Estimated Nutritional Needs:   Kcal:  1200-1400  Protein:  50-65 grams  Fluid:  1.2-1.4 L  EDUCATION NEEDS:   Education needs addressed  Tracer Gutridge A. Jimmye Norman, RD, LDN, CDE Pager: [phone] After hours Pager: [phone]

## 2015-09-14 NOTE — Progress Notes (Signed)
Morning EKG results text paged to Dr. Hilbert Bible. No new orders at this time. Will continue to monitor

## 2015-09-14 NOTE — Progress Notes (Signed)
CSW received consult regarding discharge plan. CSW spoke with patient's niece and sister at bedside. They would like to take patient home. Patient also lives with her brother and has 24hr caregivers. Family reported having no trouble taking care of patient and getting her to medical appointments.  CSW signing off.  Percell Locus Mechel Haggard LCSWA [phone]

## 2015-09-14 NOTE — Evaluation (Signed)
Physical Therapy Evaluation Patient Details Name: Stacey Fernandez MRN: [id number] DOB: [birth year] Today's Date: 09/14/2015   History of Present Illness  82 y.o. female with medical history significant for mixed Alzheimer's and vascular dementia, hypertension, chronic diastolic heart failure on Lasix, chronic kidney disease stage II, functional constipation, dyslipidemia, protein calorie malnutrition and a chronic sacral decubitus (chronic x 1 year) who was sent to the ER by her family because of altered mentation.  Dx of AKI, dehydration, sacral decubitus stage 4, encephalopathy.   Clinical Impression  Pt admitted with above diagnosis. Pt currently with functional limitations due to the deficits listed below (see PT Problem List). Min to MOD assist for mobility, ambulated 5', distance limited by pt having BM. Pt appears near baseline with mobility, family plans to take her home where she has 24* assist. Pt will benefit from skilled PT to increase their independence and safety with mobility to allow discharge to the venue listed below.       Follow Up Recommendations No PT follow up    Equipment Recommendations  Other (comment) (shower chair)    Recommendations for Other Services       Precautions / Restrictions Precautions Precautions: Fall Precaution Comments: family reports no h/o falls Restrictions Weight Bearing Restrictions: No      Mobility  Bed Mobility Overal bed mobility: Needs Assistance Bed Mobility: Supine to Sit     Supine to sit: Mod assist     General bed mobility comments: assist to raise trunk and advance BLEs  Transfers Overall transfer level: Needs assistance Equipment used: 1 person hand held assist Transfers: Sit to/from Stand Sit to Stand: Mod assist         General transfer comment: assist to rise  Ambulation/Gait Ambulation/Gait assistance: Min assist Ambulation Distance (Feet): 6 Feet Assistive device: 1 person hand held assist Gait  Pattern/deviations: Decreased step length - right;Decreased step length - left     General Gait Details: min HHA for balance, distance limited bc pt starting having BM while walking, no LOB  Stairs            Wheelchair Mobility    Modified Rankin (Stroke Patients Only)       Balance Overall balance assessment: Needs assistance Sitting-balance support: Bilateral upper extremity supported;Feet supported Sitting balance-Leahy Scale: Fair       Standing balance-Leahy Scale: Fair                               Pertinent Vitals/Pain Pain Assessment: Faces Faces Pain Scale: No hurt    Home Living Family/patient expects to be discharged to:: Private residence Living Arrangements: Other (Comment) (brother) Available Help at Discharge: Family;Personal care attendant;Available 24 hours/day   Home Access: Stairs to enter   [employer] of Steps: 1 Home Layout: One level Home Equipment: Bedside commode;Walker - 2 wheels;Hand held shower head Additional Comments: 2 caregivers daily, lives with brother    Prior Function Level of Independence: Needs assistance   Gait / Transfers Assistance Needed: walks slowly with hand held assistance, sometimes with RW usually without; assist with stair, no falls  ADL's / Homemaking Assistance Needed: assistance from caregiver, can feed self with encouragement, sat down in tub, needs shower chair        Hand Dominance        Extremity/Trunk Assessment   Upper Extremity Assessment: Defer to OT evaluation  Lower Extremity Assessment: Generalized weakness (not able to perform MMT 2* dementia, pt able to WB thru BLEs, AAROM WFL)      Cervical / Trunk Assessment: Kyphotic  Communication   Communication: Receptive difficulties;Expressive difficulties (2* dementia)  Cognition Arousal/Alertness: Awake/alert Behavior During Therapy: Flat affect Overall Cognitive Status: History of cognitive  impairments - at baseline (not oriented to location, birthdate)                      General Comments      Exercises        Assessment/Plan    PT Assessment Patient needs continued PT services  PT Diagnosis Generalized weakness   PT Problem List Decreased activity tolerance;Decreased balance;Decreased mobility  PT Treatment Interventions Gait training;Therapeutic exercise   PT Goals (Current goals can be found in the Care Plan section) Acute Rehab PT Goals Patient Stated Goal: return home PT Goal Formulation: With family Time For Goal Achievement: 09/28/15 Potential to Achieve Goals: Fair    Frequency Min 3X/week   Barriers to discharge        Co-evaluation               End of Session Equipment Utilized During Treatment: Gait belt Activity Tolerance: Patient tolerated treatment well Patient left: in chair;with call bell/phone within reach;with chair alarm set Nurse Communication: Mobility status    Functional Assessment Tool Used: clinical judgement Functional Limitation: Mobility: Walking and moving around Mobility: Walking and Moving Around Current Status [phone]): At least 40 percent but less than 60 percent impaired, limited or restricted Mobility: Walking and Moving Around Goal Status [phone]): At least 20 percent but less than 40 percent impaired, limited or restricted    Time: 1023-1107 PT Time Calculation (min) (ACUTE ONLY): 44 min   -Charges:   PT Evaluation $PT Eval Low Complexity: 1 Procedure PT Treatments $Therapeutic Activity: 8-22 mins   PT G Codes:   PT G-Codes **NOT FOR INPATIENT CLASS** Functional Assessment Tool Used: clinical judgement Functional Limitation: Mobility: Walking and moving around Mobility: Walking and Moving Around Current Status (P3295): At least 40 percent but less than 60 percent impaired, limited or restricted Mobility: Walking and Moving Around Goal Status [phone]): At least 20 percent but less than 40 percent  impaired, limited or restricted    Stacey Fernandez 09/14/2015, 11:23 AM  --[phone]

## 2015-09-14 NOTE — Progress Notes (Signed)
[facility]                                                                              Patient Demographics  Stacey Fernandez, is a 82 y.o. female, DOB - [birth year], ZOX:[id number]  Admit date - 09/13/2015   Admitting Physician Stacey Rocks, MD  Outpatient Primary MD for the patient is Stacey Boys, DO  Outpatient specialists:   LOS -   days    Chief Complaint  Patient presents with  . Altered Mental Status       Brief summary   Patient is a 82 year old female with mixed Alzheimer's and vascular dementia, hypertension, chronic diastolic failure on Lasix, CTD stage II, functional constipation, dyslipidemia, protein calorie malnutrition, chronic sacral decub was sent to ED because of altered mental status. Patient was last evaluated by PCP on 5/23 at which time the decubitus ulcer was evaluated. Patient's family also reported that for the past several months patient has not been able to get up or ambulate. In ED, patient was hemodynamically stable, chest x-ray showed no acute disease, CT head showed atrophy but no acute abnormality. BMET showed sodium 147, potassium 4.1, UA positive for UT, ketones   Assessment & Plan    AKI (acute kidney injury) on CKD (chronic kidney disease), stage II -Suspect multifactorial etiology: Diarrhea secondary to severe constipation, recent increase in baseline diuretic dosage and poor oral intake for the past 24 hours -Baseline renal function BUN 14, creatinine 0.84 with current BUN 39 and creatinine 1.40 - Continue to hold Lasix, and creatinine normalized  UTI - Obtain urine cultures, blood cultures, placed on IV Rocephin,   Dehydration with hypernatremia - Improving with IV fluid hydration, change to D5   Metabolic encephalopathy/ Mixed Alzheimer's and vascular dementia: Likely worsened due to UTI, dehydration, hyponatremia  - continue IV Rocephin, D5  - Wound care consult, PT OT - Avoid sedatives or haldol    Prolonged Q-T interval on ECG -Continue to hold Remeron - QTC 493   Essential hypertension -Only on Lasix prior to admission -May need to consider other medications to treat hypertension at time of discharge   Decubitus ulcer of sacral region, stage 4 -Wound care consult   Chronic diastolic heart failure, NYHA class 1  -Appears compensated   Hyperlipidemia -Continue Lipitor   Protein-calorie malnutrition, moderate, failure to thrive -Palliative care consult -Nutrition consultation    Functional constipation with Diarrhea -Check abdominal x-ray: This revealed significant constipation with rectal impaction - Placed on bowel regimen with senna-s, miralax   Code Status DO NOT RESUSCITATE  DVT Prophylaxis:  Lovenox    Family Communication:  No family member at the bedside Disposition will likely need skilled nursing facility  Time Spent in minutes   *  Procedures:  CT head  Consultants:   None   Antimicrobials:    IV Rocephin 5/31   Medications  Scheduled Meds: . atorvastatin  10 mg Oral Daily  . cefTRIAXone (ROCEPHIN)  IV  1 g Intravenous Q24H  . dorzolamide  2 drop Right Eye BID  . enoxaparin (LOVENOX) injection  30 mg Subcutaneous Q24H  . latanoprost  1  drop Both Eyes QHS  . memantine  28 mg Oral Daily  . mupirocin cream   Topical Daily   Continuous Infusions: . sodium chloride 75 mL/hr at 09/14/15 0148   PRN Meds:.acetaminophen **OR** acetaminophen, bisacodyl, sodium chloride   Antibiotics   Anti-infectives    Start     Dose/Rate Route Frequency Ordered Stop   09/14/15 1500  cefTRIAXone (ROCEPHIN) 1 g in dextrose 5 % 50 mL IVPB     1 g 100 mL/hr over 30 Minutes Intravenous Every 24 hours 09/14/15 1315          Subjective:   Stacey Fernandez was seen and examined today. Lethargic but opens eyes. Dry mucous membrane.  Patient denies chest pain, shortness of breath, abdominal pain, N/V/D/C, new weakness, numbess, tingling. No  acute events overnight.    Objective:   Filed Vitals:   09/13/15 1902 09/13/15 2252 09/14/15 0253 09/14/15 0614  BP: 137/79 142/83 121/68 138/71  Pulse: 90 84 82 84  Temp: 99.9 F (37.7 C) 97.8 F (36.6 C)  97.6 F (36.4 C)  TempSrc: Oral Oral  Oral  Resp: 15 17 16 16   Weight:    40.37 kg (89 lb)  SpO2: 100% 100% 92% 97%    Intake/Output Summary (Last 24 hours) at 09/14/15 1316 Last data filed at 09/14/15 0853  Gross per 24 hour  Intake  857.5 ml  Output      0 ml  Net  857.5 ml     Wt Readings from Last 3 Encounters:  09/14/15 40.37 kg (89 lb)  09/06/15 39.191 kg (86 lb 6.4 oz)  06/10/15 39.009 kg (86 lb)     Exam  General: Confused, lethargic  HEENT:    Neck: Supple, no JVD, no masses  Cardiovascular: S1 S2 auscultated, no rubs, murmurs or gallops. Regular rate and rhythm.  Respiratory: Clear to auscultation bilaterally, no wheezing, rales or rhonchi  Gastrointestinal: Soft, nontender, nondistended, + bowel sounds  Ext: no cyanosis clubbing or edema  Neuro:  did not follow commands  SkinStage IV decub with dressing intact  psych: Confused and lethargic    Data Reviewed:  I have personally reviewed following labs and imaging studies  Micro Results No results found for this or any previous visit (from the past 240 hour(s)).  Radiology Reports Ct Head Wo Contrast  09/13/2015  CLINICAL DATA:  Unresponsive EXAM: CT HEAD WITHOUT CONTRAST TECHNIQUE: Contiguous axial images were obtained from the base of the skull through the vertex without intravenous contrast. COMPARISON:  None. FINDINGS: Bony calvarium is intact. Atrophic changes are noted. Mild chronic white matter ischemic changes noted as well. No findings to suggest acute hemorrhage, acute infarction or space-occupying mass lesion are noted. IMPRESSION: Atrophic and ischemic changes without acute abnormality. Electronically Signed   By: Alcide Clever M.D.   On: 09/13/2015 13:50   Dg Chest Port 1  View  09/13/2015  CLINICAL DATA:  Altered mental status.  COPD EXAM: PORTABLE CHEST 1 VIEW COMPARISON:  Chest CT 01/20/2014 FINDINGS: Chronic cardiomegaly.  Negative aortic and hilar contours. Skin folds on the right. Postsurgical right lung with apical chain sutures. There is no edema, consolidation, effusion, or pneumothorax. No acute osseous finding. IMPRESSION: No evidence of acute cardiopulmonary disease. Electronically Signed   By: Marnee Spring M.D.   On: 09/13/2015 12:46   Dg Abd Portable 1v  09/13/2015  CLINICAL DATA:  Constipation EXAM: PORTABLE ABDOMEN - 1 VIEW COMPARISON:  None. FINDINGS: Scattered large and small bowel gas is  noted. No obstructive changes are seen. Fecal material is noted within the right colon as well as within the rectal wall. This may represent an early impaction. No free air is seen. No bony abnormality is noted. IMPRESSION: Possible early fecal impaction in the rectum. Electronically Signed   By: Alcide Clever M.D.   On: 09/13/2015 14:37    Lab Data:  CBC:  Recent Labs Lab 09/13/15 1150 09/14/15 0520  WBC 16.5* 14.3*  NEUTROABS 12.8*  --   HGB 14.3 12.5  HCT 44.8 40.2  MCV 91.6 92.0  PLT 183 180   Basic Metabolic Panel:  Recent Labs Lab 09/13/15 1150 09/13/15 1805 09/14/15 0520  NA 147*  --  146*  K 4.1  --  3.9  CL 113*  --  117*  CO2 24  --  22  GLUCOSE 100*  --  77  BUN 39*  --  26*  CREATININE 1.40*  --  0.90  CALCIUM 9.1  --  8.5*  MG  --  2.4  --   PHOS  --  3.1  --    GFR: Estimated Creatinine Clearance: 29.7 mL/min (by C-G formula based on Cr of 0.9). Liver Function Tests:  Recent Labs Lab 09/13/15 1150 09/14/15 0520  AST 32 27  ALT 30 24  ALKPHOS 123 96  BILITOT 0.6 0.7  PROT 7.4 6.3*  ALBUMIN 3.4* 2.8*   No results for input(s): LIPASE, AMYLASE in the last 168 hours. No results for input(s): AMMONIA in the last 168 hours. Coagulation Profile: No results for input(s): INR, PROTIME in the last 168 hours. Cardiac  Enzymes: No results for input(s): CKTOTAL, CKMB, CKMBINDEX, TROPONINI in the last 168 hours. BNP (last 3 results) No results for input(s): PROBNP in the last 8760 hours. HbA1C: No results for input(s): HGBA1C in the last 72 hours. CBG: No results for input(s): GLUCAP in the last 168 hours. Lipid Profile: No results for input(s): CHOL, HDL, LDLCALC, TRIG, CHOLHDL, LDLDIRECT in the last 72 hours. Thyroid Function Tests:  Recent Labs  09/13/15 1805  TSH 1.518   Anemia Panel: No results for input(s): VITAMINB12, FOLATE, FERRITIN, TIBC, IRON, RETICCTPCT in the last 72 hours. Urine analysis:    Component Value Date/Time   COLORURINE AMBER* 09/13/2015 2300   APPEARANCEUR TURBID* 09/13/2015 2300   LABSPEC 1.017 09/13/2015 2300   PHURINE 7.5 09/13/2015 2300   GLUCOSEU NEGATIVE 09/13/2015 2300   HGBUR MODERATE* 09/13/2015 2300   BILIRUBINUR NEGATIVE 09/13/2015 2300   KETONESUR NEGATIVE 09/13/2015 2300   PROTEINUR 30* 09/13/2015 2300   UROBILINOGEN 1.0 10/31/2010 1241   NITRITE NEGATIVE 09/13/2015 2300   LEUKOCYTESUR SMALL* 09/13/2015 2300     Preet Perrier M.D. [facility] 09/14/2015, 1:16 PM  Pager: [phone] Between 7am to 7pm - call Pager - [phone]  After 7pm go to [email or web address] - password [email or web address]  Call night coverage person covering after 7pm

## 2015-09-15 DIAGNOSIS — F0280 Dementia in other diseases classified elsewhere without behavioral disturbance: Secondary | ICD-10-CM | POA: Diagnosis present

## 2015-09-15 DIAGNOSIS — G309 Alzheimer's disease, unspecified: Secondary | ICD-10-CM | POA: Diagnosis not present

## 2015-09-15 DIAGNOSIS — Z79899 Other long term (current) drug therapy: Secondary | ICD-10-CM | POA: Diagnosis not present

## 2015-09-15 DIAGNOSIS — E785 Hyperlipidemia, unspecified: Secondary | ICD-10-CM | POA: Diagnosis present

## 2015-09-15 DIAGNOSIS — E871 Hypo-osmolality and hyponatremia: Secondary | ICD-10-CM | POA: Diagnosis present

## 2015-09-15 DIAGNOSIS — Z87891 Personal history of nicotine dependence: Secondary | ICD-10-CM | POA: Diagnosis not present

## 2015-09-15 DIAGNOSIS — Z66 Do not resuscitate: Secondary | ICD-10-CM | POA: Diagnosis present

## 2015-09-15 DIAGNOSIS — F0150 Vascular dementia without behavioral disturbance: Secondary | ICD-10-CM | POA: Diagnosis present

## 2015-09-15 DIAGNOSIS — E870 Hyperosmolality and hypernatremia: Secondary | ICD-10-CM | POA: Diagnosis present

## 2015-09-15 DIAGNOSIS — I130 Hypertensive heart and chronic kidney disease with heart failure and stage 1 through stage 4 chronic kidney disease, or unspecified chronic kidney disease: Secondary | ICD-10-CM | POA: Diagnosis present

## 2015-09-15 DIAGNOSIS — J449 Chronic obstructive pulmonary disease, unspecified: Secondary | ICD-10-CM | POA: Diagnosis present

## 2015-09-15 DIAGNOSIS — I4581 Long QT syndrome: Secondary | ICD-10-CM | POA: Diagnosis present

## 2015-09-15 DIAGNOSIS — Z7982 Long term (current) use of aspirin: Secondary | ICD-10-CM | POA: Diagnosis not present

## 2015-09-15 DIAGNOSIS — R918 Other nonspecific abnormal finding of lung field: Secondary | ICD-10-CM | POA: Diagnosis present

## 2015-09-15 DIAGNOSIS — Z515 Encounter for palliative care: Secondary | ICD-10-CM | POA: Diagnosis not present

## 2015-09-15 DIAGNOSIS — R627 Adult failure to thrive: Secondary | ICD-10-CM | POA: Diagnosis present

## 2015-09-15 DIAGNOSIS — L89154 Pressure ulcer of sacral region, stage 4: Secondary | ICD-10-CM | POA: Diagnosis not present

## 2015-09-15 DIAGNOSIS — I5032 Chronic diastolic (congestive) heart failure: Secondary | ICD-10-CM | POA: Diagnosis present

## 2015-09-15 DIAGNOSIS — N390 Urinary tract infection, site not specified: Secondary | ICD-10-CM | POA: Diagnosis present

## 2015-09-15 DIAGNOSIS — K5904 Chronic idiopathic constipation: Secondary | ICD-10-CM | POA: Diagnosis present

## 2015-09-15 DIAGNOSIS — Z681 Body mass index (BMI) 19 or less, adult: Secondary | ICD-10-CM | POA: Diagnosis not present

## 2015-09-15 DIAGNOSIS — N179 Acute kidney failure, unspecified: Secondary | ICD-10-CM | POA: Diagnosis not present

## 2015-09-15 DIAGNOSIS — E860 Dehydration: Secondary | ICD-10-CM | POA: Diagnosis present

## 2015-09-15 DIAGNOSIS — F05 Delirium due to known physiological condition: Secondary | ICD-10-CM | POA: Diagnosis present

## 2015-09-15 DIAGNOSIS — I444 Left anterior fascicular block: Secondary | ICD-10-CM | POA: Diagnosis present

## 2015-09-15 DIAGNOSIS — G9341 Metabolic encephalopathy: Secondary | ICD-10-CM | POA: Diagnosis not present

## 2015-09-15 DIAGNOSIS — N182 Chronic kidney disease, stage 2 (mild): Secondary | ICD-10-CM | POA: Diagnosis present

## 2015-09-15 DIAGNOSIS — I69951 Hemiplegia and hemiparesis following unspecified cerebrovascular disease affecting right dominant side: Secondary | ICD-10-CM | POA: Diagnosis not present

## 2015-09-15 DIAGNOSIS — K5649 Other impaction of intestine: Secondary | ICD-10-CM | POA: Diagnosis present

## 2015-09-15 DIAGNOSIS — E43 Unspecified severe protein-calorie malnutrition: Secondary | ICD-10-CM | POA: Diagnosis not present

## 2015-09-15 DIAGNOSIS — Z85118 Personal history of other malignant neoplasm of bronchus and lung: Secondary | ICD-10-CM | POA: Diagnosis not present

## 2015-09-15 DIAGNOSIS — R4182 Altered mental status, unspecified: Secondary | ICD-10-CM | POA: Diagnosis present

## 2015-09-15 LAB — BASIC METABOLIC PANEL
ANION GAP: 4 — AB (ref 5–15)
BUN: 25 mg/dL — ABNORMAL HIGH (ref 6–20)
CHLORIDE: 114 mmol/L — AB (ref 101–111)
CO2: 22 mmol/L (ref 22–32)
CREATININE: 0.85 mg/dL (ref 0.44–1.00)
Calcium: 7.9 mg/dL — ABNORMAL LOW (ref 8.9–10.3)
GFR calc non Af Amer: >60 mL/min (ref >60)
Glucose, Bld: 112 mg/dL — ABNORMAL HIGH (ref 65–99)
Potassium: 3.3 mmol/L — ABNORMAL LOW (ref 3.5–5.1)
Sodium: 140 mmol/L (ref 135–145)

## 2015-09-15 LAB — CBC
HCT: 33.3 % — ABNORMAL LOW (ref 36.0–46.0)
HEMOGLOBIN: 10.3 g/dL — AB (ref 12.0–15.0)
MCH: 28.4 pg (ref 26.0–34.0)
MCHC: 30.9 g/dL (ref 30.0–36.0)
MCV: 91.7 fL (ref 78.0–100.0)
Platelets: 164 10*3/uL (ref 150–400)
RBC: 3.63 MIL/uL — AB (ref 3.87–5.11)
RDW: 14.9 % (ref 11.5–15.5)
WBC: 11.6 10*3/uL — ABNORMAL HIGH (ref 4.0–10.5)

## 2015-09-15 MED ORDER — POTASSIUM CHLORIDE CRYS ER 20 MEQ PO TBCR
40.0000 meq | EXTENDED_RELEASE_TABLET | Freq: Once | ORAL | Status: AC
  Administered 2015-09-15: 40 meq via ORAL

## 2015-09-15 NOTE — Consult Note (Signed)
Consultation Note Date: 09/15/2015   Patient Name: Stacey Fernandez  DOB: [birth year]  MRN: [id number]  Age / Sex: 82 y.o., female  PCP: Gildardo Cranker, DO Referring Physician: Mendel Corning, MD  Reason for Consultation: Establishing goals of care  HPI/Patient Profile: 82 y.o. female  with past medical history of mixed Alzheimer's and vascular dementia, hypertension, chronic diastolic failure on Lasix, CTD stage II, functional constipation, dyslipidemia, protein calorie malnutrition, chronic sacral decub x 1 year was sent to ED 09/13/2015 because of altered mental status. Stacey Fernandez is being treated for UTI.   Clinical Assessment and Goals of Care: I met today with Stacey Fernandez and her niece/primary caregiver at bedside. She tells me that Stacey Fernandez was able to ambulate independently and feed herself with good appetite PTA. She has seen decline over the past years and and needs assistance with bathing and needs encouragement when feeding but does feed herself and eat all her food. She is also incontinent but aware when she is wet. Stacey Fernandez is awake and did smile and speak brief words to me at the end of our conversation.  I did speak much with niece about dementia and the progression and signs/symptoms of worsening dementia and end stage dementia. She confirms family's desire for DNR. She also expresses that her aunt has had a wonderful life of 11 years. They do seem more focused on her QOL and keeping her at home with family to care for her. I gave niece Hard Choices to assist with future decisions. They are hopeful for improvement and return home to maximize her QOL for the time she does have. I do not believe she is yet eligible for hospice - we did not discuss this today.   NEXT OF KIN siblings    Code Status/Advance Care Planning:  DNR   Symptom Management:   Denies pain/discomfort.   Palliative  Prophylaxis:   Delirium Protocol and Turn Reposition  Additional Recommendations (Limitations, Scope, Preferences):  Avoid Hospitalization  Psycho-social/Spiritual:   Desire for further Chaplaincy support:no  Additional Recommendations: Caregiving  Support/Resources  Prognosis:   Unable to determine  Discharge Planning: Return home with family.       Primary Diagnoses: Present on Admission:  . Functional constipation . Essential hypertension . Decubitus ulcer of sacral region, stage 4 ([place]) . (Resolved) Alzheimer's dementia without behavioral disturbance . Hyperlipidemia . Protein-calorie malnutrition, moderate ([place]) . Mixed Alzheimer's and vascular dementia . CKD (chronic kidney disease), stage II . AKI (acute kidney injury) () . Dehydration with hypernatremia . Metabolic encephalopathy . Chronic diastolic heart failure, NYHA class 1 ([place]) . Diarrhea . Prolonged Q-T interval on ECG  I have reviewed the medical record, interviewed the patient and family, and examined the patient. The following aspects are pertinent.  Past Medical History  Diagnosis Date  . Hyperlipidemia   . Lung mass   . COPD (chronic obstructive pulmonary disease) ([place])   . Urinary frequency   . Hemiplegia affecting dominant side, late effect of cerebrovascular disease   .  Anxiety state, unspecified   . Chronic airway obstruction, not elsewhere classified   . Other malaise and fatigue   . Memory loss   . Chronic kidney disease (CKD), stage II (mild)     [name] 09/13/2015  . Dementia   . Senile dementia with delirium   . Mixed Alzheimer's and vascular dementia     /notes 09/13/2015  . Sacral decubitus ulcer     chronic; stage IV/notes 09/13/2015  . Functional constipation     [name] 09/13/2015  . Protein calorie malnutrition ([place])     [name] 09/13/2015  . Arthritis     "hands, feet" (09/13/2015)  . Lung cancer [facility])    Social History   Social History  . Marital Status: Widowed     Spouse Name: N/A  . Number of Children: N/A  . Years of Education: N/A   Social History Main Topics  . Smoking status: Former Smoker -- 0.50 packs/day    Types: Cigarettes    Quit date: 06/09/2010  . Smokeless tobacco: Never Used  . Alcohol Use: No  . Drug Use: No  . Sexual Activity: No   Other Topics Concern  . None   Social History Narrative   Family History  Problem Relation Age of Onset  . Heart disease Mother   . Cancer Father   . Heart disease Sister   . Heart disease Brother    Scheduled Meds: . atorvastatin  10 mg Oral Daily  . cefTRIAXone (ROCEPHIN)  IV  1 g Intravenous Q24H  . dorzolamide  2 drop Right Eye BID  . enoxaparin (LOVENOX) injection  30 mg Subcutaneous Q24H  . feeding supplement  1 Container Oral BID BM  . feeding supplement (ENSURE ENLIVE)  237 mL Oral Q24H  . latanoprost  1 drop Both Eyes QHS  . memantine  28 mg Oral Daily  . multivitamin with minerals  1 tablet Oral Daily  . mupirocin cream   Topical Daily  . senna-docusate  2 tablet Oral QHS   Continuous Infusions: . dextrose 75 mL/hr at 09/15/15 0505   PRN Meds:.acetaminophen **OR** acetaminophen, bisacodyl, polyethylene glycol, sodium chloride Medications Prior to Admission:  Prior to Admission medications   Medication Sig Start Date End Date Taking? Authorizing Provider  aspirin 81 MG tablet Take 81 mg by mouth daily.   Yes Historical Provider, MD  atorvastatin (LIPITOR) 10 MG tablet TAKE 1 TABLET BY MOUTH EVERY DAY 02/14/15  Yes Lauree Chandler, NP  furosemide (LASIX) 20 MG tablet TAKE 0.5 TABLETS (10 MG TOTAL) BY MOUTH DAILY. 07/01/15  Yes Monica Eulas Post, DO  latanoprost (XALATAN) 0.005 % ophthalmic solution PLACE 1 DROP INTO BOTH EYES AT BEDTIME. 02/15/14  Yes Tiffany L Reed, DO  mirtazapine (REMERON) 15 MG tablet TAKE 1/2 TABLET BY MOUTH AT BEDTIME. 07/04/15  Yes Monica Carter, DO  NAMENDA XR 28 MG CP24 24 hr capsule TAKE ONE CAPSULE BY MOUTH EVERY DAY 07/12/15  Yes Gildardo Cranker, DO    sodium chloride (OCEAN) 0.65 % SOLN nasal spray Place 1 spray into both nostrils as needed for congestion. 07/28/13  Yes Mahima Pandey, MD  TRUSOPT 2 % ophthalmic solution Place 2 drops into the right eye 2 (two) times daily.  08/20/14  Yes Historical Provider, MD   No Known Allergies Review of Systems  Unable to perform ROS: Dementia    Physical Exam  Constitutional: She appears well-developed.  HENT:  Head: Normocephalic and atraumatic.  Cardiovascular: Normal rate.   Pulmonary/Chest: Breath sounds normal.  No accessory muscle usage. No tachypnea. No respiratory distress.  Abdominal: Normal appearance.  Neurological: She is alert. She is disoriented.    Vital Signs: BP 126/75 mmHg  Pulse 73  Temp(Src) 98 F (36.7 C) (Oral)  Resp 16  Wt 40.37 kg (89 lb)  SpO2 100% Pain Assessment: PAINAD       SpO2: SpO2: 100 % O2 Device:SpO2: 100 % O2 Flow Rate: .   IO: Intake/output summary:  Intake/Output Summary (Last 24 hours) at 09/15/15 1323 Last data filed at 09/15/15 0600  Gross per 24 hour  Intake 1321.25 ml  Output      0 ml  Net 1321.25 ml    LBM: Last BM Date: 09/14/15 Baseline Weight: Weight: 40.37 kg (89 lb) Most recent weight: Weight: 40.37 kg (89 lb)     Palliative Assessment/Data:   Flowsheet Rows        Most Recent Value   Intake Tab    Referral Department  -- [ED]   Unit at Time of Referral  ER   Palliative Care Primary Diagnosis  Neurology   Date Notified  09/13/15   Palliative Care Type  New Palliative care   Reason for referral  Clarify Goals of Care   Date of Admission  09/13/15   # of days IP prior to Palliative referral  0   Clinical Assessment    Psychosocial & Spiritual Assessment    Palliative Care Outcomes       Time In: 8016 Time Out: 1235 Time Total: 21mn Greater than 50%  of this time was spent counseling and coordinating care related to the above assessment and plan.  Signed by: PPershing Proud NP   Please contact Palliative  Medicine Team phone at [phone] questions and concerns.  For individual provider: See AShea Evans

## 2015-09-15 NOTE — Progress Notes (Signed)
Received bedside report on patient, who is sleeping soundly on special mattress.  D5 infusing at 75cc/hr. Will monitor.

## 2015-09-15 NOTE — Progress Notes (Signed)
[facility]                                                                              Patient Demographics  Stacey Fernandez, is a 82 y.o. female, DOB - [birth year], HUD:[id number]  Admit date - 09/13/2015   Admitting Physician Stacey Dickens, MD  Outpatient Primary MD for the patient is Stacey Cranker, DO  Outpatient specialists:   LOS -   days    Chief Complaint  Patient presents with  . Altered Mental Status       Brief summary   Patient is a 82 year old female with mixed Alzheimer's and vascular dementia, hypertension, chronic diastolic failure on Lasix, CTD stage II, functional constipation, dyslipidemia, protein calorie malnutrition, chronic sacral decub was sent to ED because of altered mental status. Patient was last evaluated by PCP on 5/23 at which time the decubitus ulcer was evaluated. Patient's family also reported that for the past several months patient has not been able to get up or ambulate. In ED, patient was hemodynamically stable, chest x-ray showed no acute disease, CT head showed atrophy but no acute abnormality. BMET showed sodium 147, potassium 4.1, UA positive for UT, ketones   Assessment & Plan    AKI (acute kidney injury) on CKD (chronic kidney disease), stage II -Suspect multifactorial etiology: Diarrhea secondary to severe constipation, recent increase in baseline diuretic dosage and poor oral intake for the past 24 hours -Baseline renal function BUN 14, creatinine 0.84 with current BUN 39 and creatinine 1.40 - Lasix was held, patient was placed on gentle hydration, creatinine has normalized  UTI - Follow urine cultures, blood cultures, placed on IV Rocephin,   Dehydration with hypernatremia - Improving with IV fluid hydration, on D5   Metabolic encephalopathy/ Mixed Alzheimer's and vascular dementia: Likely worsened due to UTI, dehydration, hyponatremia  - continue IV Rocephin, D5  - Wound care consult - PT OT Evaluation  done, family requested to take the patient home when she is discharged. - Avoid sedatives or haldol - Patient seen multiple times today, family member in the room, Stacey Fernandez, currently not at baseline, lethargic.    Prolonged Q-T interval on ECG -Continue to hold Remeron - QTC 493   Essential hypertension - Only on Lasix prior to admission - May need to consider other medications to treat hypertension at time of discharge   Decubitus ulcer of sacral region, stage 4 -Wound care consult appreciated   Chronic diastolic heart failure, NYHA class 1  -Appears compensated   Hyperlipidemia -Continue Lipitor   Protein-calorie malnutrition, moderate, failure to thrive -Palliative care consult pending -Nutrition consultation    Functional constipation with Diarrhea -Check abdominal x-ray: revealed significant constipation with rectal impaction - Placed on bowel regimen with senna-s, miralax   Code Status DO NOT RESUSCITATE  DVT Prophylaxis:  Lovenox    Family Communication:  Discussed in detail about management, labs, imaging with patient's family member, Stacey Fernandez at bedside  Disposition   Time Spent in minutes   *104mnutes  Procedures:  CT head  Consultants:   None   Antimicrobials:    IV Rocephin 5/31   Medications  Scheduled  Meds: . atorvastatin  10 mg Oral Daily  . cefTRIAXone (ROCEPHIN)  IV  1 g Intravenous Q24H  . dorzolamide  2 drop Right Eye BID  . enoxaparin (LOVENOX) injection  30 mg Subcutaneous Q24H  . feeding supplement  1 Container Oral BID BM  . feeding supplement (ENSURE ENLIVE)  237 mL Oral Q24H  . latanoprost  1 drop Both Eyes QHS  . memantine  28 mg Oral Daily  . multivitamin with minerals  1 tablet Oral Daily  . mupirocin cream   Topical Daily  . senna-docusate  2 tablet Oral QHS   Continuous Infusions: . dextrose 75 mL/hr at 09/15/15 0505   PRN Meds:.acetaminophen **OR** acetaminophen, bisacodyl, polyethylene glycol, sodium  chloride   Antibiotics   Anti-infectives    Start     Dose/Rate Route Frequency Ordered Stop   09/14/15 1500  cefTRIAXone (ROCEPHIN) 1 g in dextrose 5 % 50 mL IVPB     1 g 100 mL/hr over 30 Minutes Intravenous Every 24 hours 09/14/15 1315          Subjective:   Stacey Fernandez was seen and examined today. Currently lethargic, follows some commands. More responsive to her niece at the bedside. No acute events overnight, afebrile. Difficult to obtain review of systems from the patient.  Objective:   Filed Vitals:   09/14/15 1942 09/14/15 2335 09/15/15 0454 09/15/15 0610  BP: 119/63 119/64 126/75   Pulse: 80 76 73   Temp: 97.9 F (36.6 C) 98.1 F (36.7 C) 98 F (36.7 C)   TempSrc:  Oral    Resp: '17 16 16   '$ Weight:    40.37 kg (89 lb)  SpO2: 100% 100% 100%     Intake/Output Summary (Last 24 hours) at 09/15/15 1304 Last data filed at 09/15/15 0600  Gross per 24 hour  Intake 1321.25 ml  Output      0 ml  Net 1321.25 ml     Wt Readings from Last 3 Encounters:  09/15/15 40.37 kg (89 lb)  09/06/15 39.191 kg (86 lb 6.4 oz)  06/10/15 39.009 kg (86 lb)     Exam  General: Confused, lethargic  HEENT:    Neck:   Cardiovascular: S1 S2Clear, RRR  Respiratory: Clear to auscultation bilaterally, no wheezing, rales or rhonchi  Gastrointestinal: Soft, nontender, nondistended, + bowel sounds  Ext: no cyanosis clubbing or edema  Neuro:  did not follow commands  SkinStage IV decub with dressing intact  psych:  lethargic    Data Reviewed:  I have personally reviewed following labs and imaging studies  Micro Results No results found for this or any previous visit (from the past 240 hour(s)).  Radiology Reports Ct Head Wo Contrast  09/13/2015  CLINICAL DATA:  Unresponsive EXAM: CT HEAD WITHOUT CONTRAST TECHNIQUE: Contiguous axial images were obtained from the base of the skull through the vertex without intravenous contrast. COMPARISON:  None. FINDINGS: Bony  calvarium is intact. Atrophic changes are noted. Mild chronic white matter ischemic changes noted as well. No findings to suggest acute hemorrhage, acute infarction or space-occupying mass lesion are noted. IMPRESSION: Atrophic and ischemic changes without acute abnormality. Electronically Signed   By: Stacey Fernandez M.D.   On: 09/13/2015 13:50   Dg Chest Port 1 View  09/13/2015  CLINICAL DATA:  Altered mental status.  COPD EXAM: PORTABLE CHEST 1 VIEW COMPARISON:  Chest CT 01/20/2014 FINDINGS: Chronic cardiomegaly.  Negative aortic and hilar contours. Skin folds on the right. Postsurgical right lung with  apical chain sutures. There is no edema, consolidation, effusion, or pneumothorax. No acute osseous finding. IMPRESSION: No evidence of acute cardiopulmonary disease. Electronically Signed   By: Monte Fantasia M.D.   On: 09/13/2015 12:46   Dg Abd Portable 1v  09/13/2015  CLINICAL DATA:  Constipation EXAM: PORTABLE ABDOMEN - 1 VIEW COMPARISON:  None. FINDINGS: Scattered large and small bowel gas is noted. No obstructive changes are seen. Fecal material is noted within the right colon as well as within the rectal wall. This may represent an early impaction. No free air is seen. No bony abnormality is noted. IMPRESSION: Possible early fecal impaction in the rectum. Electronically Signed   By: Stacey Fernandez M.D.   On: 09/13/2015 14:37    Lab Data:  CBC:  Recent Labs Lab 09/13/15 1150 09/14/15 0520 09/15/15 0543  WBC 16.5* 14.3* 11.6*  NEUTROABS 12.8*  --   --   HGB 14.3 12.5 10.3*  HCT 44.8 40.2 33.3*  MCV 91.6 92.0 91.7  PLT 183 180 132   Basic Metabolic Panel:  Recent Labs Lab 09/13/15 1150 09/13/15 1805 09/14/15 0520 09/15/15 0543  NA 147*  --  146* 140  K 4.1  --  3.9 3.3*  CL 113*  --  117* 114*  CO2 24  --  22 22  GLUCOSE 100*  --  77 112*  BUN 39*  --  26* 25*  CREATININE 1.40*  --  0.90 0.85  CALCIUM 9.1  --  8.5* 7.9*  MG  --  2.4  --   --   PHOS  --  3.1  --   --     GFR: Estimated Creatinine Clearance: 31.4 mL/min (by C-G formula based on Cr of 0.85). Liver Function Tests:  Recent Labs Lab 09/13/15 1150 09/14/15 0520  AST 32 27  ALT 30 24  ALKPHOS 123 96  BILITOT 0.6 0.7  PROT 7.4 6.3*  ALBUMIN 3.4* 2.8*   No results for input(s): LIPASE, AMYLASE in the last 168 hours. No results for input(s): AMMONIA in the last 168 hours. Coagulation Profile: No results for input(s): INR, PROTIME in the last 168 hours. Cardiac Enzymes: No results for input(s): CKTOTAL, CKMB, CKMBINDEX, TROPONINI in the last 168 hours. BNP (last 3 results) No results for input(s): PROBNP in the last 8760 hours. HbA1C: No results for input(s): HGBA1C in the last 72 hours. CBG:  Recent Labs Lab 09/14/15 2008  GLUCAP 143*   Lipid Profile: No results for input(s): CHOL, HDL, LDLCALC, TRIG, CHOLHDL, LDLDIRECT in the last 72 hours. Thyroid Function Tests:  Recent Labs  09/13/15 1805  TSH 1.518   Anemia Panel: No results for input(s): VITAMINB12, FOLATE, FERRITIN, TIBC, IRON, RETICCTPCT in the last 72 hours. Urine analysis:    Component Value Date/Time   COLORURINE AMBER* 09/13/2015 2300   APPEARANCEUR TURBID* 09/13/2015 2300   LABSPEC 1.017 09/13/2015 2300   PHURINE 7.5 09/13/2015 2300   GLUCOSEU NEGATIVE 09/13/2015 2300   HGBUR MODERATE* 09/13/2015 2300   BILIRUBINUR NEGATIVE 09/13/2015 2300   KETONESUR NEGATIVE 09/13/2015 2300   PROTEINUR 30* 09/13/2015 2300   UROBILINOGEN 1.0 10/31/2010 1241   NITRITE NEGATIVE 09/13/2015 2300   LEUKOCYTESUR SMALL* 09/13/2015 2300     RAI,RIPUDEEP M.D. [facility] 09/15/2015, 1:04 PM  Pager: [phone] Between 7am to 7pm - call Pager - [phone]  After 7pm go to [email or web address] - password [email or web address]  Call night coverage person covering after 7pm

## 2015-09-16 DIAGNOSIS — Z515 Encounter for palliative care: Secondary | ICD-10-CM | POA: Insufficient documentation

## 2015-09-16 LAB — CBC
HCT: 38.3 % (ref 36.0–46.0)
Hemoglobin: 12 g/dL (ref 12.0–15.0)
MCH: 28.3 pg (ref 26.0–34.0)
MCHC: 31.3 g/dL (ref 30.0–36.0)
MCV: 90.3 fL (ref 78.0–100.0)
PLATELETS: 167 10*3/uL (ref 150–400)
RBC: 4.24 MIL/uL (ref 3.87–5.11)
RDW: 14.4 % (ref 11.5–15.5)
WBC: 8.9 10*3/uL (ref 4.0–10.5)

## 2015-09-16 LAB — C DIFFICILE QUICK SCREEN W PCR REFLEX
C DIFFICILE (CDIFF) INTERP: NEGATIVE
C Diff antigen: NEGATIVE
C Diff toxin: NEGATIVE

## 2015-09-16 LAB — URINE CULTURE: Culture: >=100000 — AB

## 2015-09-16 LAB — BASIC METABOLIC PANEL
Anion gap: 6 (ref 5–15)
BUN: 12 mg/dL (ref 6–20)
CHLORIDE: 108 mmol/L (ref 101–111)
CO2: 22 mmol/L (ref 22–32)
CREATININE: 0.8 mg/dL (ref 0.44–1.00)
Calcium: 8.1 mg/dL — ABNORMAL LOW (ref 8.9–10.3)
GFR calc Af Amer: >60 mL/min (ref >60)
GFR calc non Af Amer: >60 mL/min (ref >60)
GLUCOSE: 96 mg/dL (ref 65–99)
Potassium: 3.4 mmol/L — ABNORMAL LOW (ref 3.5–5.1)
Sodium: 136 mmol/L (ref 135–145)

## 2015-09-16 MED ORDER — POTASSIUM CHLORIDE CRYS ER 20 MEQ PO TBCR
40.0000 meq | EXTENDED_RELEASE_TABLET | Freq: Once | ORAL | Status: AC
  Administered 2015-09-16: 40 meq via ORAL
  Filled 2015-09-16: qty 2

## 2015-09-16 NOTE — Progress Notes (Addendum)
Physical Therapy Treatment Patient Details Name: Stacey Fernandez MRN: [id number] DOB: Feb 24, 1930 Today's Date: 09/16/2015    History of Present Illness 82 y.o. female with medical history significant for mixed Alzheimer's and vascular dementia, hypertension, chronic diastolic heart failure on Lasix, chronic kidney disease stage II, functional constipation, dyslipidemia, protein calorie malnutrition and a chronic sacral decubitus (chronic x 1 year) who was sent to the ER by her family because of altered mentation.  Dx of AKI, dehydration, sacral decubitus stage 4, encephalopathy.     PT Comments    Pt progressing with mobility, she ambulated 10' x 2 with RW and min A. Distance limited by incontinence of urine. Family plans to take pt home where she has 24 hour assist.   Follow Up Recommendations  No PT follow up     Equipment Recommendations  Other (comment) (shower chair)    Recommendations for Other Services       Precautions / Restrictions Precautions Precautions: Fall Precaution Comments: family reports no h/o falls Restrictions Weight Bearing Restrictions: No    Mobility  Bed Mobility               General bed mobility comments: up in recliner  Transfers Overall transfer level: Needs assistance Equipment used: Rolling walker (2 wheeled) Transfers: Sit to/from Stand Sit to Stand: Mod assist         General transfer comment: assist to rise, verbal/manual cues for hand placement  Ambulation/Gait Ambulation/Gait assistance: Min assist Ambulation Distance (Feet): 20 Feet (10' x 2 with seated rest on bedside commode) Assistive device: Rolling walker (2 wheeled) Gait Pattern/deviations: Step-to pattern;Decreased step length - right;Decreased step length - left;Decreased stride length;Narrow base of support   Gait velocity interpretation: at or above normal speed for age/gender General Gait Details: min A to maneuver RW around obstacles and to guide pt with  turns, no LOB, distance limited by incontinence of urine   Stairs            Wheelchair Mobility    Modified Rankin (Stroke Patients Only)       Balance     Sitting balance-Leahy Scale: Fair       Standing balance-Leahy Scale: Fair                      Cognition Arousal/Alertness: Awake/alert Behavior During Therapy: Flat affect Overall Cognitive Status: History of cognitive impairments - at baseline (not oriented, does not follow commands, poor historian)                      Exercises      General Comments        Pertinent Vitals/Pain Faces Pain Scale: No hurt    Home Living                      Prior Function            PT Goals (current goals can now be found in the care plan section) Acute Rehab PT Goals Patient Stated Goal: return home PT Goal Formulation: With family Time For Goal Achievement: 09/28/15 Potential to Achieve Goals: Fair Progress towards PT goals: Progressing toward goals    Frequency  Min 3X/week    PT Plan Current plan remains appropriate    Co-evaluation             End of Session Equipment Utilized During Treatment: Gait belt Activity Tolerance: Patient tolerated treatment well Patient left: in  chair;with call bell/phone within reach;with chair alarm set;with family/visitor present     Time: 0076-2263 PT Time Calculation (min) (ACUTE ONLY): 30 min  Charges:  $Gait Training: 8-22 mins $Therapeutic Activity: 8-22 mins                    G Codes:      [name] 09/16/2015, 2:44 PM [phone]

## 2015-09-16 NOTE — Progress Notes (Signed)
[facility]                                                                              Patient Demographics  Stacey Fernandez, is a 82 y.o. female, DOB - [birth year], ZOX:[id number]  Admit date - 09/13/2015   Admitting Physician Ozella Rocks, MD  Outpatient Primary MD for the patient is Kirt Boys, DO  Outpatient specialists:   LOS -   days    Chief Complaint  Patient presents with  . Altered Mental Status       Brief summary   Patient is a 82 year old female with mixed Alzheimer's and vascular dementia, hypertension, chronic diastolic failure on Lasix, CTD stage II, functional constipation, dyslipidemia, protein calorie malnutrition, chronic sacral decub was sent to ED because of altered mental status. Patient was last evaluated by PCP on 5/23 at which time the decubitus ulcer was evaluated. Patient's family also reported that for the past several months patient has not been able to get up or ambulate. In ED, patient was hemodynamically stable, chest x-ray showed no acute disease, CT head showed atrophy but no acute abnormality. BMET showed sodium 147, potassium 4.1, UA positive for UT, ketones   Assessment & Plan    AKI (acute kidney injury) on CKD (chronic kidney disease), stage II -Suspect multifactorial etiology: Diarrhea, recent increase in diuretic, poor oral intake for the past 24 hours -Baseline Creatinine 0.84, admitted with creatinine of 1.4. Lasix was held, patient was placed on gentle hydration, creatinine has normalized.  UTI - Follow urine cultures, blood cultures, placed on IV Rocephin,   Dehydration with hypernatremia - Improving with IV fluid hydration, on D5   Metabolic encephalopathy/ Mixed Alzheimer's and vascular dementia: Likely worsened due to UTI, dehydration, hyponatremia  -Improving, much more alert and awake and conversing today, caregiver at the bedside who is feeding her.  - continue IV Rocephin, D5  - Wound care  consult - PT OT Evaluation recommending skilled nursing facility however family interested in taking home and very - Avoid sedatives or haldol.  -Palliative consulted    Prolonged Q-T interval on ECG -Continue to hold Remeron - QTC 493   Essential hypertension - Only on Lasix prior to admission - May need to consider other medications to treat hypertension at time of discharge   Decubitus ulcer of sacral region, stage 4 -Wound care consult appreciated   Chronic diastolic heart failure, NYHA class 1  -Appears compensated   Hyperlipidemia -Continue Lipitor   Protein-calorie malnutrition, moderate, failure to thrive -Palliative care consult pending -Nutrition consultation    Functional constipation with Diarrhea - abdominal x-ray: revealed significant constipation with rectal impaction -Currently holding stool softeners and laxatives due to diarrhea, C. difficile negative    Code Status DO NOT RESUSCITATE  DVT Prophylaxis:  Lovenox    Family Communication:  Discussed in detail about management, labs, imaging with patient's family member, Velna Hatchet at bedside yesterday, caregiver today   Disposition  hopefully next 24 hours   Time Spent in minutes   *  Procedures:  CT head  Consultants:   None   Antimicrobials:    IV Rocephin 5/31  Medications  Scheduled Meds: . atorvastatin  10 mg Oral Daily  . cefTRIAXone (ROCEPHIN)  IV  1 g Intravenous Q24H  . dorzolamide  2 drop Right Eye BID  . enoxaparin (LOVENOX) injection  30 mg Subcutaneous Q24H  . feeding supplement  1 Container Oral BID BM  . feeding supplement (ENSURE ENLIVE)  237 mL Oral Q24H  . latanoprost  1 drop Both Eyes QHS  . memantine  28 mg Oral Daily  . multivitamin with minerals  1 tablet Oral Daily  . mupirocin cream   Topical Daily   Continuous Infusions: . dextrose Stopped (09/16/15 1010)   PRN Meds:.acetaminophen **OR** acetaminophen, polyethylene glycol, sodium  chloride   Antibiotics   Anti-infectives    Start     Dose/Rate Route Frequency Ordered Stop   09/14/15 1500  cefTRIAXone (ROCEPHIN) 1 g in dextrose 5 % 50 mL IVPB     1 g 100 mL/hr over 30 Minutes Intravenous Every 24 hours 09/14/15 1315          Subjective:   Stacey Fernandez was seen and examined today much more alert and awake, responding, tolerating diet from heart caregiver. . denies any pain. No nausea or vomiting. Afebrile.   Objective:   Filed Vitals:   09/15/15 0610 09/15/15 1400 09/15/15 2326 09/16/15 0504  BP:  151/84 120/82 147/82  Pulse:  83 77 81  Temp:  98.4 F (36.9 C) 99 F (37.2 C) 98.3 F (36.8 C)  TempSrc:  Oral    Resp:  16  16  Weight: 40.37 kg (89 lb)   41.6 kg (91 lb 11.4 oz)  SpO2:  99% 99% 100%    Intake/Output Summary (Last 24 hours) at 09/16/15 1406 Last data filed at 09/16/15 1341  Gross per 24 hour  Intake 1486.25 ml  Output    300 ml  Net 1186.25 ml     Wt Readings from Last 3 Encounters:  09/16/15 41.6 kg (91 lb 11.4 oz)  09/06/15 39.191 kg (86 lb 6.4 oz)  06/10/15 39.009 kg (86 lb)     Exam  GeneraAlert and awake, responding to some commands  HEENT:    Neck:  no JVP   Cardiovascular: S1 S2Clear, RRR  Respiratory: Clear to auscultation bilaterally, no wheezing, rales or rhonchi  Gastrointestinal: Soft, nontender, nondistended, + bowel sounds  Ext: no cyanosis clubbing or edema  Neuro:  did not follow commands  Skin: Stage IV decub with dressing intact  psychAlert and awake    Data Reviewed:  I have personally reviewed following labs and imaging studies  Micro Results Recent Results (from the past 240 hour(s))  Urine culture     Status: Abnormal   Collection Time: 09/13/15 11:00 PM  Result Value Ref Range Status   Specimen Description URINE, CATHETERIZED  Final   Special Requests NONE  Final   Culture >=100,000 COLONIES/mL ESCHERICHIA COLI (A)  Final   Report Status 09/16/2015 FINAL  Final   Organism  ID, Bacteria ESCHERICHIA COLI (A)  Final      Susceptibility   Escherichia coli - MIC*    AMPICILLIN <=2 SENSITIVE Sensitive     CEFAZOLIN <=4 SENSITIVE Sensitive     CEFTRIAXONE <=1 SENSITIVE Sensitive     CIPROFLOXACIN <=0.25 SENSITIVE Sensitive     GENTAMICIN <=1 SENSITIVE Sensitive     IMIPENEM <=0.25 SENSITIVE Sensitive     NITROFURANTOIN <=16 SENSITIVE Sensitive     TRIMETH/SULFA <=20 SENSITIVE Sensitive     AMPICILLIN/SULBACTAM <=2 SENSITIVE Sensitive  PIP/TAZO <=4 SENSITIVE Sensitive     * >=100,000 COLONIES/mL ESCHERICHIA COLI  C difficile quick scan w PCR reflex     Status: None   Collection Time: 09/16/15 11:06 AM  Result Value Ref Range Status   C Diff antigen NEGATIVE NEGATIVE Final   C Diff toxin NEGATIVE NEGATIVE Final   C Diff interpretation Negative for toxigenic C. difficile  Final    Radiology Reports Ct Head Wo Contrast  09/13/2015  CLINICAL DATA:  Unresponsive EXAM: CT HEAD WITHOUT CONTRAST TECHNIQUE: Contiguous axial images were obtained from the base of the skull through the vertex without intravenous contrast. COMPARISON:  None. FINDINGS: Bony calvarium is intact. Atrophic changes are noted. Mild chronic white matter ischemic changes noted as well. No findings to suggest acute hemorrhage, acute infarction or space-occupying mass lesion are noted. IMPRESSION: Atrophic and ischemic changes without acute abnormality. Electronically Signed   By: Alcide Clever M.D.   On: 09/13/2015 13:50   Dg Chest Port 1 View  09/13/2015  CLINICAL DATA:  Altered mental status.  COPD EXAM: PORTABLE CHEST 1 VIEW COMPARISON:  Chest CT 01/20/2014 FINDINGS: Chronic cardiomegaly.  Negative aortic and hilar contours. Skin folds on the right. Postsurgical right lung with apical chain sutures. There is no edema, consolidation, effusion, or pneumothorax. No acute osseous finding. IMPRESSION: No evidence of acute cardiopulmonary disease. Electronically Signed   By: Marnee Spring M.D.   On:  09/13/2015 12:46   Dg Abd Portable 1v  09/13/2015  CLINICAL DATA:  Constipation EXAM: PORTABLE ABDOMEN - 1 VIEW COMPARISON:  None. FINDINGS: Scattered large and small bowel gas is noted. No obstructive changes are seen. Fecal material is noted within the right colon as well as within the rectal wall. This may represent an early impaction. No free air is seen. No bony abnormality is noted. IMPRESSION: Possible early fecal impaction in the rectum. Electronically Signed   By: Alcide Clever M.D.   On: 09/13/2015 14:37    Lab Data:  CBC:  Recent Labs Lab 09/13/15 1150 09/14/15 0520 09/15/15 0543 09/16/15 0614  WBC 16.5* 14.3* 11.6* 8.9  NEUTROABS 12.8*  --   --   --   HGB 14.3 12.5 10.3* 12.0  HCT 44.8 40.2 33.3* 38.3  MCV 91.6 92.0 91.7 90.3  PLT 183 180 164 167   Basic Metabolic Panel:  Recent Labs Lab 09/13/15 1150 09/13/15 1805 09/14/15 0520 09/15/15 0543 09/16/15 0614  NA 147*  --  146* 140 136  K 4.1  --  3.9 3.3* 3.4*  CL 113*  --  117* 114* 108  CO2 24  --  22 22 22   GLUCOSE 100*  --  77 112* 96  BUN 39*  --  26* 25* 12  CREATININE 1.40*  --  0.90 0.85 0.80  CALCIUM 9.1  --  8.5* 7.9* 8.1*  MG  --  2.4  --   --   --   PHOS  --  3.1  --   --   --    GFR: Estimated Creatinine Clearance: 34.4 mL/min (by C-G formula based on Cr of 0.8). Liver Function Tests:  Recent Labs Lab 09/13/15 1150 09/14/15 0520  AST 32 27  ALT 30 24  ALKPHOS 123 96  BILITOT 0.6 0.7  PROT 7.4 6.3*  ALBUMIN 3.4* 2.8*   No results for input(s): LIPASE, AMYLASE in the last 168 hours. No results for input(s): AMMONIA in the last 168 hours. Coagulation Profile: No results for input(s): INR, PROTIME  in the last 168 hours. Cardiac Enzymes: No results for input(s): CKTOTAL, CKMB, CKMBINDEX, TROPONINI in the last 168 hours. BNP (last 3 results) No results for input(s): PROBNP in the last 8760 hours. HbA1C: No results for input(s): HGBA1C in the last 72 hours. CBG:  Recent Labs Lab  09/14/15 2008  GLUCAP 143*   Lipid Profile: No results for input(s): CHOL, HDL, LDLCALC, TRIG, CHOLHDL, LDLDIRECT in the last 72 hours. Thyroid Function Tests:  Recent Labs  09/13/15 1805  TSH 1.518   Anemia Panel: No results for input(s): VITAMINB12, FOLATE, FERRITIN, TIBC, IRON, RETICCTPCT in the last 72 hours. Urine analysis:    Component Value Date/Time   COLORURINE AMBER* 09/13/2015 2300   APPEARANCEUR TURBID* 09/13/2015 2300   LABSPEC 1.017 09/13/2015 2300   PHURINE 7.5 09/13/2015 2300   GLUCOSEU NEGATIVE 09/13/2015 2300   HGBUR MODERATE* 09/13/2015 2300   BILIRUBINUR NEGATIVE 09/13/2015 2300   KETONESUR NEGATIVE 09/13/2015 2300   PROTEINUR 30* 09/13/2015 2300   UROBILINOGEN 1.0 10/31/2010 1241   NITRITE NEGATIVE 09/13/2015 2300   LEUKOCYTESUR SMALL* 09/13/2015 2300     Waris Rodger M.D. [facility] 09/16/2015, 2:06 PM  Pager: [phone] Between 7am to 7pm - call Pager - [phone]  After 7pm go to [email or web address] - password [email or web address]  Call night coverage person covering after 7pm

## 2015-09-17 DIAGNOSIS — N179 Acute kidney failure, unspecified: Principal | ICD-10-CM

## 2015-09-17 LAB — CBC
HEMATOCRIT: 35.4 % — AB (ref 36.0–46.0)
HEMOGLOBIN: 11.1 g/dL — AB (ref 12.0–15.0)
MCH: 28.1 pg (ref 26.0–34.0)
MCHC: 31.4 g/dL (ref 30.0–36.0)
MCV: 89.6 fL (ref 78.0–100.0)
Platelets: 165 10*3/uL (ref 150–400)
RBC: 3.95 MIL/uL (ref 3.87–5.11)
RDW: 14.1 % (ref 11.5–15.5)
WBC: 8.1 10*3/uL (ref 4.0–10.5)

## 2015-09-17 LAB — BASIC METABOLIC PANEL
ANION GAP: 5 (ref 5–15)
BUN: 10 mg/dL (ref 6–20)
CO2: 23 mmol/L (ref 22–32)
Calcium: 8.3 mg/dL — ABNORMAL LOW (ref 8.9–10.3)
Chloride: 111 mmol/L (ref 101–111)
Creatinine, Ser: 0.67 mg/dL (ref 0.44–1.00)
Glucose, Bld: 94 mg/dL (ref 65–99)
POTASSIUM: 3.6 mmol/L (ref 3.5–5.1)
SODIUM: 139 mmol/L (ref 135–145)

## 2015-09-17 MED ORDER — AMOXICILLIN-POT CLAVULANATE 875-125 MG PO TABS: 1.0000 | ORAL_TABLET | Freq: Two times a day (BID) | ORAL | Status: DC

## 2015-09-17 MED ORDER — SACCHAROMYCES BOULARDII 250 MG PO CAPS
250.0000 mg | ORAL_CAPSULE | Freq: Two times a day (BID) | ORAL | Status: DC
  Administered 2015-09-17: 250 mg via ORAL
  Filled 2015-09-17: qty 1

## 2015-09-17 MED ORDER — SACCHAROMYCES BOULARDII 250 MG PO CAPS: 250.0000 mg | ORAL_CAPSULE | Freq: Two times a day (BID) | ORAL | Status: DC

## 2015-09-17 NOTE — Discharge Instructions (Signed)
Follow with Gildardo Cranker, DO in 1-2 weeks  Please get a complete blood count and chemistry panel checked by your Primary MD at your next visit, and again as instructed by your Primary MD. Please get your medications reviewed and adjusted by your Primary MD.  Please request your Primary MD to go over all Hospital Tests and Procedure/Radiological results at the follow up, please get all Hospital records sent to your Prim MD by signing hospital release before you go home.  If you had Pneumonia of Lung problems at the Hospital: Please get a 2 view Chest X ray done in 6-8 weeks after hospital discharge or sooner if instructed by your Primary MD.  If you have Congestive Heart Failure: Please call your Cardiologist or Primary MD anytime you have any of the following symptoms:  1) 3 pound weight gain in 24 hours or 5 pounds in 1 week  2) shortness of breath, with or without a dry hacking cough  3) swelling in the hands, feet or stomach  4) if you have to sleep on extra pillows at night in order to breathe  Follow cardiac low salt diet and 1.5 lit/day fluid restriction.  If you have diabetes Accuchecks 4 times/day, Once in AM empty stomach and then before each meal. Log in all results and show them to your primary doctor at your next visit. If any glucose reading is under 80 or above 300 call your primary MD immediately.  If you have Seizure/Convulsions/Epilepsy: Please do not drive, operate heavy machinery, participate in activities at heights or participate in high speed sports until you have seen by Primary MD or a Neurologist and advised to do so again.  If you had Gastrointestinal Bleeding: Please ask your Primary MD to check a complete blood count within one week of discharge or at your next visit. Your endoscopic/colonoscopic biopsies that are pending at the time of discharge, will also need to followed by your Primary MD.  Get Medicines reviewed and adjusted. Please take all your  medications with you for your next visit with your Primary MD  Please request your Primary MD to go over all hospital tests and procedure/radiological results at the follow up, please ask your Primary MD to get all Hospital records sent to his/her office.  If you experience worsening of your admission symptoms, develop shortness of breath, life threatening emergency, suicidal or homicidal thoughts you must seek medical attention immediately by calling 911 or calling your MD immediately  if symptoms less severe.  You must read complete instructions/literature along with all the possible adverse reactions/side effects for all the Medicines you take and that have been prescribed to you. Take any new Medicines after you have completely understood and accpet all the possible adverse reactions/side effects.   Do not drive or operate heavy machinery when taking Pain medications.   Do not take more than prescribed Pain, Sleep and Anxiety Medications  Special Instructions: If you have smoked or chewed Tobacco  in the last 2 yrs please stop smoking, stop any regular Alcohol  and or any Recreational drug use.  Wear Seat belts while driving.  Please note You were cared for by a hospitalist during your hospital stay. If you have any questions about your discharge medications or the care you received while you were in the hospital after you are discharged, you can call the unit and asked to speak with the hospitalist on call if the hospitalist that took care of you is not available. Once  you are discharged, your primary care physician will handle any further medical issues. Please note that NO REFILLS for any discharge medications will be authorized once you are discharged, as it is imperative that you return to your primary care physician (or establish a relationship with a primary care physician if you do not have one) for your aftercare needs so that they can reassess your need for medications and monitor your  lab values.  You can reach the hospitalist office at phone [phone] or fax [phone]   If you do not have a primary care physician, you can call [phone] for a physician referral.  Activity: As tolerated with Full fall precautions use walker/cane & assistance as needed  Diet: regular  Disposition Home

## 2015-09-17 NOTE — Progress Notes (Signed)
Stacey Fernandez to be D/Fernandez'd Home per MD order.  Discussed with the patient and all questions fully answered.  VSS, Pressure  Injury on sacrum covered with clean dry dressing.  IV catheter discontinued intact. Site without signs and symptoms of complications. Dressing and pressure applied.  An After Visit Summary was printed and given to the patient. Patient received prescription.  D/Fernandez education completed with patient/family/caregiver including follow up instructions, medication list, d/Fernandez activities limitations if indicated, with other d/Fernandez instructions as indicated by MD - patient able to verbalize understanding, all questions fully answered.   Patient instructed to return to ED, call 911, or call MD for any changes in condition.   Patient to be escorted via [place], and D/Fernandez home via private auto.  L'ESPERANCE, Stacey Fernandez 09/17/2015 10:41 AM

## 2015-09-17 NOTE — Discharge Summary (Signed)
Physician Discharge Summary  Stacey Fernandez GBT:[id number] DOB: [birth year] DOA: 09/13/2015  PCP: Kirt Boys, DO  Admit date: 09/13/2015 Discharge date: 09/17/2015  Time spent: > 30 minutes  Recommendations for Outpatient Follow-up:  1. Follow up with PCP in 1-2 weeks   Discharge Diagnoses:  Principal Problem:   AKI (acute kidney injury) (HCC) Active Problems:   Hyperlipidemia   Protein-calorie malnutrition, moderate (HCC)   Functional constipation   Mixed Alzheimer's and vascular dementia   Essential hypertension   Decubitus ulcer of sacral region, stage 4 (HCC)   CKD (chronic kidney disease), stage II   Dehydration with hypernatremia   Metabolic encephalopathy   Chronic diastolic heart failure, NYHA class 1 (HCC)   Diarrhea   Prolonged Q-T interval on ECG   Protein-calorie malnutrition, severe   Palliative care encounter   Discharge Condition: stable  Diet recommendation: regular  Filed Weights   09/15/15 0610 09/16/15 0504 09/17/15 0656  Weight: 40.37 kg (89 lb) 41.6 kg (91 lb 11.4 oz) 44.453 kg (98 lb)    History of present illness:  See H&P, Labs, Consult and Test reports for all details in brief, patient is a 82 year old female with mixed Alzheimer's and vascular dementia, hypertension, chronic diastolic failure on Lasix, CTD stage II, functional constipation, dyslipidemia, protein calorie malnutrition, chronic sacral decub was sent to ED because of altered mental status. Patient was last evaluated by PCP on 5/23 at which time the decubitus ulcer was evaluated. Patient's family also reported that for the past several months patient has not been able to get up or ambulate. In ED, patient was hemodynamically stable, chest x-ray showed no acute disease, CT head showed atrophy but no acute abnormality. BMET showed sodium 147, potassium 4.1, UA positive for UT, ketones  Hospital Course:  UTI - Patient was started empirically on IV ceftriaxone, and urine cultures  were sent. Eventually speciated Escherichia coli which was sensitive to ampicillin, her antibiotics were narrowed to Augmentin and she is to complete 5 additional days as an outpatient. On the day of discharge, she is close to baseline per her caregiver, she is stable to go home. AKI (acute kidney injury) on CKD (chronic kidney disease), stage II - Suspect multifactorial etiology: Diarrhea, recent increase in diuretic, poor oral intake for the past 24 hours prior to admission. Her creatinine returned to normal with IV hydration. Patient with good by mouth intake on discharge. Dehydration with hypernatremia - Improving with IV fluid hydration, sodium normalized to 139 on discharge Metabolic encephalopathy/ Mixed Alzheimer's and vascular dementia: Likely worsened due to UTI, dehydration, hyponatremia - gradually improving, very close to baseline per caregiver, anticipate ongoing improvement in home environment as well as continued treatment for her UTI. PT OT Evaluation recommending skilled nursing facility however family interested in taking home Prolonged Q-T interval on ECG  Essential hypertension Decubitus ulcer of sacral region, stage 4 - Wound care consulted,  "Dressing procedure/placement/frequency: Continue present plan of care with antibiotic ointment and foam dressing to protect from further injury. It is difficult to keep wound from becoming soiled related to frequent diarrhea. Nutrition consult to optimize protein intake. Air mattress to reduce pressure. Discussed plan of care with family member at the bedside, they deny further questions." Chronic diastolic heart failure, NYHA class 1 - Appears compensated Hyperlipidemia - Continue Lipitor Protein-calorie malnutrition, moderate, failure to thrive  Goals of care - Palliative care consulted, patient is DO NOT RESUSCITATE. Per palliative, not yet eligible for hospice Functional constipation with Diarrhea -  C. difficile negative    Procedures:  None    Consultations:  Palliative   Discharge Exam: Filed Vitals:   09/16/15 1459 09/16/15 2008 09/17/15 0621 09/17/15 0656  BP: 143/80 130/69 148/82   Pulse: 87 86 81   Temp: 98.5 F (36.9 C) 98 F (36.7 C) 98 F (36.7 C)   TempSrc: Oral Oral Axillary   Resp: 18 16 18    Weight:    44.453 kg (98 lb)  SpO2: 99% 100% 100%     General: NAD Cardiovascular: RRR Respiratory: CTA biL  Discharge Instructions Activity:  As tolerated   Get Medicines reviewed and adjusted: Please take all your medications with you for your next visit with your Primary MD  Please request your Primary MD to go over all hospital tests and procedure/radiological results at the follow up, please ask your Primary MD to get all Hospital records sent to his/her office.  If you experience worsening of your admission symptoms, develop shortness of breath, life threatening emergency, suicidal or homicidal thoughts you must seek medical attention immediately by calling 911 or calling your MD immediately if symptoms less severe.  You must read complete instructions/literature along with all the possible adverse reactions/side effects for all the Medicines you take and that have been prescribed to you. Take any new Medicines after you have completely understood and accpet all the possible adverse reactions/side effects.   Do not drive when taking Pain medications.   Do not take more than prescribed Pain, Sleep and Anxiety Medications  Special Instructions: If you have smoked or chewed Tobacco in the last 2 yrs please stop smoking, stop any regular Alcohol and or any Recreational drug use.  Wear Seat belts while driving.  Please note  You were cared for by a hospitalist during your hospital stay. Once you are discharged, your primary care physician will handle any further medical issues. Please note that NO REFILLS for any discharge medications will be authorized once you are discharged,  as it is imperative that you return to your primary care physician (or establish a relationship with a primary care physician if you do not have one) for your aftercare needs so that they can reassess your need for medications and monitor your lab values.    Medication List    TAKE these medications        amoxicillin-clavulanate 875-125 MG tablet  Commonly known as:  AUGMENTIN  Take 1 tablet by mouth 2 (two) times daily.  Notes to Patient:  Antibiotic. Take full course.      aspirin 81 MG tablet  Take 81 mg by mouth daily.     atorvastatin 10 MG tablet  Commonly known as:  LIPITOR  TAKE 1 TABLET BY MOUTH EVERY DAY     furosemide 20 MG tablet  Commonly known as:  LASIX  TAKE 0.5 TABLETS (10 MG TOTAL) BY MOUTH DAILY.     latanoprost 0.005 % ophthalmic solution  Commonly known as:  XALATAN  PLACE 1 DROP INTO BOTH EYES AT BEDTIME.     mirtazapine 15 MG tablet  Commonly known as:  REMERON  TAKE 1/2 TABLET BY MOUTH AT BEDTIME.     NAMENDA XR 28 MG Cp24 24 hr capsule  Generic drug:  memantine  TAKE ONE CAPSULE BY MOUTH EVERY DAY     saccharomyces boulardii 250 MG capsule  Commonly known as:  FLORASTOR  Take 1 capsule (250 mg total) by mouth 2 (two) times daily.  Notes to Patient:  Probiotic  to help with loose bowel movements     sodium chloride 0.65 % Soln nasal spray  Commonly known as:  OCEAN  Place 1 spray into both nostrils as needed for congestion.     TRUSOPT 2 % ophthalmic solution  Generic drug:  dorzolamide  Place 2 drops into the right eye 2 (two) times daily.           Follow-up Information    Follow up with Kirt Boys, DO. Schedule an appointment as soon as possible for a visit in 2 weeks.   Specialty:  Internal Medicine   Contact information:   [address] [place] [address] [phone]       The results of significant diagnostics from this hospitalization (including imaging, microbiology, ancillary and laboratory) are listed below  for reference.    Significant Diagnostic Studies: Ct Head Wo Contrast  09/13/2015  CLINICAL DATA:  Unresponsive EXAM: CT HEAD WITHOUT CONTRAST TECHNIQUE: Contiguous axial images were obtained from the base of the skull through the vertex without intravenous contrast. COMPARISON:  None. FINDINGS: Bony calvarium is intact. Atrophic changes are noted. Mild chronic white matter ischemic changes noted as well. No findings to suggest acute hemorrhage, acute infarction or space-occupying mass lesion are noted. IMPRESSION: Atrophic and ischemic changes without acute abnormality. Electronically Signed   By: Alcide Clever M.D.   On: 09/13/2015 13:50   Dg Chest Port 1 View  09/13/2015  CLINICAL DATA:  Altered mental status.  COPD EXAM: PORTABLE CHEST 1 VIEW COMPARISON:  Chest CT 01/20/2014 FINDINGS: Chronic cardiomegaly.  Negative aortic and hilar contours. Skin folds on the right. Postsurgical right lung with apical chain sutures. There is no edema, consolidation, effusion, or pneumothorax. No acute osseous finding. IMPRESSION: No evidence of acute cardiopulmonary disease. Electronically Signed   By: Marnee Spring M.D.   On: 09/13/2015 12:46   Dg Abd Portable 1v  09/13/2015  CLINICAL DATA:  Constipation EXAM: PORTABLE ABDOMEN - 1 VIEW COMPARISON:  None. FINDINGS: Scattered large and small bowel gas is noted. No obstructive changes are seen. Fecal material is noted within the right colon as well as within the rectal wall. This may represent an early impaction. No free air is seen. No bony abnormality is noted. IMPRESSION: Possible early fecal impaction in the rectum. Electronically Signed   By: Alcide Clever M.D.   On: 09/13/2015 14:37    Microbiology: Recent Results (from the past 240 hour(s))  Urine culture     Status: Abnormal   Collection Time: 09/13/15 11:00 PM  Result Value Ref Range Status   Specimen Description URINE, CATHETERIZED  Final   Special Requests NONE  Final   Culture >=100,000 COLONIES/mL  ESCHERICHIA COLI (A)  Final   Report Status 09/16/2015 FINAL  Final   Organism ID, Bacteria ESCHERICHIA COLI (A)  Final      Susceptibility   Escherichia coli - MIC*    AMPICILLIN <=2 SENSITIVE Sensitive     CEFAZOLIN <=4 SENSITIVE Sensitive     CEFTRIAXONE <=1 SENSITIVE Sensitive     CIPROFLOXACIN <=0.25 SENSITIVE Sensitive     GENTAMICIN <=1 SENSITIVE Sensitive     IMIPENEM <=0.25 SENSITIVE Sensitive     NITROFURANTOIN <=16 SENSITIVE Sensitive     TRIMETH/SULFA <=20 SENSITIVE Sensitive     AMPICILLIN/SULBACTAM <=2 SENSITIVE Sensitive     PIP/TAZO <=4 SENSITIVE Sensitive     * >=100,000 COLONIES/mL ESCHERICHIA COLI  C difficile quick scan w PCR reflex     Status: None  Collection Time: 09/16/15 11:06 AM  Result Value Ref Range Status   C Diff antigen NEGATIVE NEGATIVE Final   C Diff toxin NEGATIVE NEGATIVE Final   C Diff interpretation Negative for toxigenic C. difficile  Final     Labs: Basic Metabolic Panel:  Recent Labs Lab 09/13/15 1150 09/13/15 1805 09/14/15 0520 09/15/15 0543 09/16/15 0614 09/17/15 0446  NA 147*  --  146* 140 136 139  K 4.1  --  3.9 3.3* 3.4* 3.6  CL 113*  --  117* 114* 108 111  CO2 24  --  22 22 22 23   GLUCOSE 100*  --  77 112* 96 94  BUN 39*  --  26* 25* 12 10  CREATININE 1.40*  --  0.90 0.85 0.80 0.67  CALCIUM 9.1  --  8.5* 7.9* 8.1* 8.3*  MG  --  2.4  --   --   --   --   PHOS  --  3.1  --   --   --   --    Liver Function Tests:  Recent Labs Lab 09/13/15 1150 09/14/15 0520  AST 32 27  ALT 30 24  ALKPHOS 123 96  BILITOT 0.6 0.7  PROT 7.4 6.3*  ALBUMIN 3.4* 2.8*   CBC:  Recent Labs Lab 09/13/15 1150 09/14/15 0520 09/15/15 0543 09/16/15 0614 09/17/15 0446  WBC 16.5* 14.3* 11.6* 8.9 8.1  NEUTROABS 12.8*  --   --   --   --   HGB 14.3 12.5 10.3* 12.0 11.1*  HCT 44.8 40.2 33.3* 38.3 35.4*  MCV 91.6 92.0 91.7 90.3 89.6  PLT 183 180 164 167 165    CBG:  Recent Labs Lab 09/14/15 2008  GLUCAP 143*     Signed:  Yazmine Sorey  [facility] 09/17/2015, 2:09 PM

## 2015-09-26 ENCOUNTER — Encounter: Payer: Self-pay | Admitting: Nurse Practitioner

## 2015-09-26 ENCOUNTER — Ambulatory Visit (INDEPENDENT_AMBULATORY_CARE_PROVIDER_SITE_OTHER): Payer: Medicare Other | Admitting: Nurse Practitioner

## 2015-09-26 VITALS — BP 117/70 | HR 68 | Temp 98.0°F | Resp 17 | Ht 60.0 in | Wt 98.0 lb

## 2015-09-26 DIAGNOSIS — E43 Unspecified severe protein-calorie malnutrition: Secondary | ICD-10-CM

## 2015-09-26 DIAGNOSIS — G309 Alzheimer's disease, unspecified: Secondary | ICD-10-CM

## 2015-09-26 DIAGNOSIS — R4182 Altered mental status, unspecified: Secondary | ICD-10-CM

## 2015-09-26 DIAGNOSIS — F0150 Vascular dementia without behavioral disturbance: Secondary | ICD-10-CM | POA: Diagnosis not present

## 2015-09-26 DIAGNOSIS — F0280 Dementia in other diseases classified elsewhere without behavioral disturbance: Secondary | ICD-10-CM

## 2015-09-26 DIAGNOSIS — N182 Chronic kidney disease, stage 2 (mild): Secondary | ICD-10-CM | POA: Diagnosis not present

## 2015-09-26 DIAGNOSIS — R197 Diarrhea, unspecified: Secondary | ICD-10-CM

## 2015-09-26 DIAGNOSIS — L89154 Pressure ulcer of sacral region, stage 4: Secondary | ICD-10-CM

## 2015-09-26 DIAGNOSIS — R5381 Other malaise: Secondary | ICD-10-CM | POA: Diagnosis not present

## 2015-09-26 DIAGNOSIS — I5032 Chronic diastolic (congestive) heart failure: Secondary | ICD-10-CM

## 2015-09-26 DIAGNOSIS — R609 Edema, unspecified: Secondary | ICD-10-CM

## 2015-09-26 NOTE — Progress Notes (Signed)
Patient ID: Stacey Fernandez, female   DOB: 1930-02-06, 82 y.o.   MRN: [id number]    PCP: Gildardo Cranker, DO  Advanced Directive information Does patient have an advance directive?: No  No Known Allergies  Chief Complaint  Patient presents with  . Hospitalization Follow-up    ED to hospital follow-up on 09-13-15     HPI: Patient is a 82 y.o. female seen in the office today for hospital follow up. Pt was hospitalized from 5/30-09/17/2015. Pt with pmh of mixed Alzheimer's and vascular dementia, hypertension, chronic diastolic failure on Lasix, CTD stage II, functional constipation, dyslipidemia, protein calorie malnutrition, chronic sacral decub was sent to ED because of altered mental status. Pt with chronic decubitus ulcer stage 4. Pt was found to have UTI. Pt was empirically on IV ceftriaxone, and urine cultures were sent. Eventually speciated Escherichia coli which was sensitive to ampicillin, her antibiotics were narrowed to Augmentin and she is to complete 5 additional days as an outpatient. Has completed her antibiotic. Pt no longer having diarrhea. Caregiver reports this comes and go and suspects it will be back.  Eating and drinking at baseline. Caregiver reports she could drink more.  Pt remains with Decubitus ulcer of sacral region, stage 4 - Wound care consulted, caregivers at home changing dressing three times daily which has been doing well. No increased drainage or odor. Not healing. Using cushion for pressure reduction. Meals with lots of protein.  Caregiver here with pt today. Reports she is having more trouble walking. Right ankle is swollen. Mental status at baseline.   Review of Systems: provided by caregiver Review of Systems  Constitutional: Negative for fever, chills and appetite change.       Thin. Frail. Caretaker says she is eating what they feed her.  HENT: Negative for mouth sores, sinus pressure and sore throat.   Eyes: Negative for visual disturbance.    Respiratory: Negative for cough and shortness of breath.   Cardiovascular: Negative for chest pain, palpitations and leg swelling.  Gastrointestinal: Negative for nausea, vomiting, diarrhea and constipation.  Genitourinary: Negative for dysuria.  Musculoskeletal: Negative for joint swelling, arthralgias, gait problem and neck pain.       Using walker due to impaired gait, No falls   Skin: Positive for wound (sacral). Negative for rash.  Neurological: Negative for dizziness, tremors, weakness and light-headedness.       Severe dementia  Psychiatric/Behavioral: Positive for confusion. Negative for behavioral problems, sleep disturbance and agitation.    Past Medical History  Diagnosis Date  . Hyperlipidemia   . Lung mass   . COPD (chronic obstructive pulmonary disease) ([place])   . Urinary frequency   . Hemiplegia affecting dominant side, late effect of cerebrovascular disease   . Anxiety state, unspecified   . Chronic airway obstruction, not elsewhere classified   . Other malaise and fatigue   . Memory loss   . Chronic kidney disease (CKD), stage II (mild)     [name] 09/13/2015  . Dementia   . Senile dementia with delirium   . Mixed Alzheimer's and vascular dementia     /notes 09/13/2015  . Sacral decubitus ulcer     chronic; stage IV/notes 09/13/2015  . Functional constipation     [name] 09/13/2015  . Protein calorie malnutrition ([place])     [name] 09/13/2015  . Arthritis     "hands, feet" (09/13/2015)  . Lung cancer [facility])    Past Surgical History  Procedure Laterality Date  . Wedge resection rul  nodule  01/10/2011    VAN TRIGT  . Mediastinal lymp node dissection  01/10/2011    VAN TRIGT  . Cataract extraction, bilateral     Social History:   reports that she quit smoking about 5 years ago. Her smoking use included Cigarettes. She smoked 0.50 packs per day. She has never used smokeless tobacco. She reports that she does not drink alcohol or use illicit drugs.  Family History   Problem Relation Age of Onset  . Heart disease Mother   . Cancer Father   . Heart disease Sister   . Heart disease Brother     Medications: Patient's Medications  New Prescriptions   No medications on file  Previous Medications   ASPIRIN 81 MG TABLET    Take 81 mg by mouth daily.   ATORVASTATIN (LIPITOR) 10 MG TABLET    TAKE 1 TABLET BY MOUTH EVERY DAY   FUROSEMIDE (LASIX) 20 MG TABLET    TAKE 0.5 TABLETS (10 MG TOTAL) BY MOUTH DAILY.   LATANOPROST (XALATAN) 0.005 % OPHTHALMIC SOLUTION    PLACE 1 DROP INTO BOTH EYES AT BEDTIME.   MIRTAZAPINE (REMERON) 15 MG TABLET    TAKE 1/2 TABLET BY MOUTH AT BEDTIME.   NAMENDA XR 28 MG CP24 24 HR CAPSULE    TAKE ONE CAPSULE BY MOUTH EVERY DAY   SACCHAROMYCES BOULARDII (FLORASTOR) 250 MG CAPSULE    Take 1 capsule (250 mg total) by mouth 2 (two) times daily.   SODIUM CHLORIDE (OCEAN) 0.65 % SOLN NASAL SPRAY    Place 1 spray into both nostrils as needed for congestion.   TRUSOPT 2 % OPHTHALMIC SOLUTION    Place 2 drops into the right eye 2 (two) times daily.   Modified Medications   No medications on file  Discontinued Medications   AMOXICILLIN-CLAVULANATE (AUGMENTIN) 875-125 MG TABLET    Take 1 tablet by mouth 2 (two) times daily.     Physical Exam:  Filed Vitals:   09/26/15 1501  BP: 117/70  Pulse: 68  Temp: 98 F (36.7 C)  TempSrc: Oral  Resp: 17  Height: 5' (1.524 m)  Weight: 98 lb (44.453 kg)   Body mass index is 19.14 kg/(m^2).  Physical Exam  Constitutional:  Frail appearing in NAD.   HENT:  Mouth/Throat: Oropharynx is clear and moist. No oropharyngeal exudate.  Eyes: Pupils are equal, round, and reactive to light. No scleral icterus.  Neck: Neck supple. Carotid bruit is not present.  Cardiovascular: Normal rate, regular rhythm, normal heart sounds and intact distal pulses.  Exam reveals no gallop and no friction rub.   No murmur heard. Pulmonary/Chest: Effort normal and breath sounds normal. No respiratory distress. She  has no wheezes. She has no rales.  Abdominal: Soft. Bowel sounds are normal. There is no hepatomegaly.  Musculoskeletal: She exhibits edema (trace edema right > left ).  Neurological: She is alert.  Significant dementia  Skin: Skin is warm and dry. No rash noted.  Sacral and Right buttock pressure sore. irreg borders 3x 6 cm  Psychiatric: She has a normal mood and affect. Her behavior is normal.    Labs reviewed: Basic Metabolic Panel:  Recent Labs  09/13/15 1805  09/15/15 0543 09/16/15 0614 09/17/15 0446  NA  --   < > 140 136 139  K  --   < > 3.3* 3.4* 3.6  CL  --   < > 114* 108 111  CO2  --   < > 22 22 23  GLUCOSE  --   < > 112* 96 94  BUN  --   < > 25* 12 10  CREATININE  --   < > 0.85 0.80 0.67  CALCIUM  --   < > 7.9* 8.1* 8.3*  MG 2.4  --   --   --   --   PHOS 3.1  --   --   --   --   TSH 1.518  --   --   --   --   < > = values in this interval not displayed. Liver Function Tests:  Recent Labs  06/10/15 1243 09/13/15 1150 09/14/15 0520  AST 38 32 27  ALT 49* 30 24  ALKPHOS 85 123 96  BILITOT 0.4 0.6 0.7  PROT 6.5 7.4 6.3*  ALBUMIN 3.8 3.4* 2.8*   No results for input(s): LIPASE, AMYLASE in the last 8760 hours. No results for input(s): AMMONIA in the last 8760 hours. CBC:  Recent Labs  03/04/15 0847 09/13/15 1150  09/15/15 0543 09/16/15 0614 09/17/15 0446  WBC 4.5 16.5*  < > 11.6* 8.9 8.1  NEUTROABS 1.6 12.8*  --   --   --   --   HGB  --  14.3  < > 10.3* 12.0 11.1*  HCT 40.8 44.8  < > 33.3* 38.3 35.4*  MCV 90 91.6  < > 91.7 90.3 89.6  PLT 176 183  < > 164 167 165  < > = values in this interval not displayed. Lipid Panel:  Recent Labs  03/04/15 0847  CHOL 154  HDL 56  LDLCALC 85  TRIG 64  CHOLHDL 2.8   TSH:  Recent Labs  09/13/15 1805  TSH 1.518   A1C: No results found for: HGBA1C   Assessment/Plan 1. Debility -worsening gait since hospitalization, will get home health Pt/Ot referral at this time - Ambulatory referral to [place]  2. Mixed Alzheimer's and vascular dementia Advanced dementia, ongoing decline noted due to disease progression. Has caregivers that assist with wound care, ADLs.   3. Edema, unspecified type Stable, conts on lasix 10 mg daily. To elevate LE as tolerates  4. Decubitus ulcer of sacral region, stage 4 (HCC) Unchanged. To cont dressing changes as ordered. Pressure reduction encouraged and good nutrition. To add ensure 1-2 times daily  5. Protein-calorie malnutrition, severe Ongoing which makes wound healing difficult  -to cont high protein diet and add ensures  -conts on remeron as well  6. Diarrhea, unspecified type improved  7. Altered mental status, unspecified altered mental status type Has improved, pt overall mental status has declined due to dementia - Ambulatory referral to [place]  8. CKD (chronic kidney disease), stage II -BUN/Cr stable on discharge, encouraged proper hydration   9. Chronic diastolic heart failure, NYHA class 1 (HCC) -euvolemic, conts on lasix 10 mg daily   To follow up with Dr Eulas Post in 2 weeks.    Carlos American. Harle Battiest  [facility] [phone] 8 am - 5 pm) [phone] (after hours)

## 2015-09-26 NOTE — Patient Instructions (Signed)
To use ensure 1-2 times daily for added nutrition and fluid  To take Multivitamin daily   Will get home health therapy  To follow up in 2 weeks with Dr Eulas Post for routine follow up.

## 2015-09-30 DIAGNOSIS — M19071 Primary osteoarthritis, right ankle and foot: Secondary | ICD-10-CM | POA: Diagnosis not present

## 2015-09-30 DIAGNOSIS — M19041 Primary osteoarthritis, right hand: Secondary | ICD-10-CM | POA: Diagnosis not present

## 2015-09-30 DIAGNOSIS — M19042 Primary osteoarthritis, left hand: Secondary | ICD-10-CM | POA: Diagnosis not present

## 2015-09-30 DIAGNOSIS — I69351 Hemiplegia and hemiparesis following cerebral infarction affecting right dominant side: Secondary | ICD-10-CM | POA: Diagnosis not present

## 2015-09-30 DIAGNOSIS — M19072 Primary osteoarthritis, left ankle and foot: Secondary | ICD-10-CM | POA: Diagnosis not present

## 2015-10-05 ENCOUNTER — Telehealth: Payer: Self-pay | Admitting: *Deleted

## 2015-10-05 NOTE — Telephone Encounter (Signed)
Okay for nurse eval

## 2015-10-05 NOTE — Telephone Encounter (Signed)
Dena with [employer] called and requested Nurse Evaluation due to Large Unstageable Decubitus on Sacrum. Is a Nurse eval ok to give verbal for. Please Advise.

## 2015-10-05 NOTE — Telephone Encounter (Signed)
Dena notified and order given

## 2015-10-11 ENCOUNTER — Telehealth: Payer: Self-pay | Admitting: *Deleted

## 2015-10-11 MED ORDER — AMBULATORY NON FORMULARY MEDICATION: Status: DC

## 2015-10-11 NOTE — Telephone Encounter (Signed)
Received fax request from [facility] [phone] Fax: [phone] requesting Prescriptions for Roho Cushion and Transfer Tub Bench. Rx printed and given to Dr. Eulas Post to review and sign.

## 2015-10-19 ENCOUNTER — Ambulatory Visit: Payer: Medicare Other | Admitting: Internal Medicine

## 2015-10-21 ENCOUNTER — Telehealth: Payer: Self-pay

## 2015-10-21 NOTE — Telephone Encounter (Signed)
Stacey Fernandez with [facility] called to request verbal order for wound treatment. Patient with stage 3 ulcer, 10 % [place] Border Gauze change 3 x weekly.  Per [employer] standing order, verbal order given. Message will be sent to patient's provider as a FYI.

## 2015-10-22 ENCOUNTER — Other Ambulatory Visit: Payer: Self-pay | Admitting: Internal Medicine

## 2015-10-30 ENCOUNTER — Other Ambulatory Visit: Payer: Self-pay | Admitting: Internal Medicine

## 2015-11-23 ENCOUNTER — Encounter: Payer: Self-pay | Admitting: Internal Medicine

## 2015-11-23 ENCOUNTER — Ambulatory Visit (INDEPENDENT_AMBULATORY_CARE_PROVIDER_SITE_OTHER): Payer: Medicare Other | Admitting: Internal Medicine

## 2015-11-23 VITALS — BP 132/74 | HR 71 | Temp 97.6°F | Ht 60.0 in | Wt 89.0 lb

## 2015-11-23 DIAGNOSIS — R609 Edema, unspecified: Secondary | ICD-10-CM | POA: Diagnosis not present

## 2015-11-23 DIAGNOSIS — F0150 Vascular dementia without behavioral disturbance: Secondary | ICD-10-CM

## 2015-11-23 DIAGNOSIS — G309 Alzheimer's disease, unspecified: Secondary | ICD-10-CM | POA: Diagnosis not present

## 2015-11-23 DIAGNOSIS — I5032 Chronic diastolic (congestive) heart failure: Secondary | ICD-10-CM | POA: Diagnosis not present

## 2015-11-23 DIAGNOSIS — I10 Essential (primary) hypertension: Secondary | ICD-10-CM | POA: Diagnosis not present

## 2015-11-23 DIAGNOSIS — E43 Unspecified severe protein-calorie malnutrition: Secondary | ICD-10-CM

## 2015-11-23 DIAGNOSIS — Z23 Encounter for immunization: Secondary | ICD-10-CM

## 2015-11-23 DIAGNOSIS — R627 Adult failure to thrive: Secondary | ICD-10-CM | POA: Diagnosis not present

## 2015-11-23 DIAGNOSIS — E785 Hyperlipidemia, unspecified: Secondary | ICD-10-CM | POA: Diagnosis not present

## 2015-11-23 DIAGNOSIS — F0280 Dementia in other diseases classified elsewhere without behavioral disturbance: Secondary | ICD-10-CM

## 2015-11-23 MED ORDER — MIRTAZAPINE 15 MG PO TABS: 15.0000 mg | ORAL_TABLET | Freq: Every day | ORAL | 6 refills | Status: DC

## 2015-11-23 NOTE — Progress Notes (Signed)
Patient ID: Stacey Fernandez, female   DOB: 1930-01-24, 82 y.o.   MRN: [id number]    Location:  [facility] Place of Service: OFFICE  Chief Complaint  Patient presents with  . Medical Management of Chronic Issues    2 week routine visit, Here with caregiver Malea    HPI:  82 yo female seen today for f/u. She was hospitalized at the end of May for UTI. She has a sacral decub ulcer and is getting wound care through [facility]. She uses a roho when seated. No pain. No f/c. Appetite reduced and she does not like ensure/boost. She is taking 1/2 tab remeron qhs. Weight down 9 lb since last OV. She is a poor historian due to dementia. Hx obtained from chart/caregiver.  She needs pneumovax. She had Prevnar in 2016. She declined tdap and shingles vaccines today  Dementia/anxiety/FTT/protein calorie malnutrition - takes namenda xr and remeron. No behavior disturbances. She has lost weight since last OV and refuses ensure/boost supplements. Albumin 2.8 in May 2017  Hyperlipidemia - stable on lipitor. LDL 85  COPD/hx lung cancer- released from oncology last year as she has not had recurrence of tumor.   HTN/edema - stable on 1/2 tab of lasix. Also takes ASA daily  Constipation - stable. BM q3days  Glaucoma - stable on eye gtts. followed by eye specialist  Past Medical History:  Diagnosis Date  . Anxiety state, unspecified   . Arthritis    "hands, feet" (09/13/2015)  . Chronic airway obstruction, not elsewhere classified   . Chronic kidney disease (CKD), stage II (mild)    [name] 09/13/2015  . COPD (chronic obstructive pulmonary disease) ([place])   . Dementia   . Functional constipation    [name] 09/13/2015  . Hemiplegia affecting dominant side, late effect of cerebrovascular disease   . Hyperlipidemia   . Lung cancer ([place])   . Lung mass   . Memory loss   . Mixed Alzheimer's and vascular dementia    /notes 09/13/2015  . Other malaise and fatigue   . Protein calorie malnutrition ([place])    [name] 09/13/2015  . Sacral decubitus ulcer    chronic; stage IV/notes 09/13/2015  . Senile dementia with delirium   . Urinary frequency     Past Surgical History:  Procedure Laterality Date  . CATARACT EXTRACTION, BILATERAL    . mediastinal lymp node dissection  01/10/2011   VAN TRIGT  . WEDGE RESECTION RUL NODULE  01/10/2011   VAN TRIGT    Patient Care Team: Gildardo Cranker, DO as PCP - General (Internal Medicine) Grant Fontana, MD (Inactive) (Internal Medicine) Ivin Poot, MD (Cardiothoracic Surgery)  Social History   Social History  . Marital status: Widowed    Spouse name: N/A  . Number of children: N/A  . Years of education: N/A   Occupational History  . Not on file.   Social History Main Topics  . Smoking status: Former Smoker    Packs/day: 0.50    Types: Cigarettes    Quit date: 06/09/2010  . Smokeless tobacco: Never Used  . Alcohol use No  . Drug use: No  . Sexual activity: No   Other Topics Concern  . Not on file   Social History Narrative  . No narrative on file     reports that she quit smoking about 5 years ago. Her smoking use included Cigarettes. She smoked 0.50 packs per day. She has never used smokeless tobacco. She reports that she does not drink alcohol  Patient ID: Stacey Fernandez, female   DOB: [birth year], 82 y.o.   MRN: [id number]    Location:  [facility] Place of Service: OFFICE  Chief Complaint  Patient presents with  . Medical Management of Chronic Issues    2 week routine visit, Here with caregiver Malea    HPI:  82 yo female seen today for f/u. She was hospitalized at the end of May for UTI. She has a sacral decub ulcer and is getting wound care through [facility] [place]. She uses a roho when seated. No pain. No f/c. Appetite reduced and she does not like ensure/boost. She is taking 1/2 tab remeron qhs. Weight down 9 lb since last OV. She is a poor historian due to dementia. Hx obtained from chart/caregiver.  She needs pneumovax. She had Prevnar in 2016. She declined tdap and shingles vaccines today  Dementia/anxiety/FTT/protein calorie malnutrition - takes namenda xr and remeron. No behavior disturbances. She has lost weight since last OV and refuses ensure/boost supplements. Albumin 2.8 in May 2017  Hyperlipidemia - stable on lipitor. LDL 85  COPD/hx lung cancer- released from oncology last year as she has not had recurrence of tumor.   HTN/edema - stable on 1/2 tab of lasix. Also takes ASA daily  Constipation - stable. BM q3days  Glaucoma - stable on eye gtts. followed by eye specialist  Past Medical History:  Diagnosis Date  . Anxiety state, unspecified   . Arthritis    "hands, feet" (09/13/2015)  . Chronic airway obstruction, not elsewhere classified   . Chronic kidney disease (CKD), stage II (mild)    [name] 09/13/2015  . COPD (chronic obstructive pulmonary disease) ([place])   . Dementia   . Functional constipation    [name] 09/13/2015  . Hemiplegia affecting dominant side, late effect of cerebrovascular disease   . Hyperlipidemia   . Lung cancer ([place])   . Lung mass   . Memory loss   . Mixed Alzheimer's and vascular dementia    /notes 09/13/2015  . Other malaise and fatigue   . Protein calorie malnutrition ([place])    [name] 09/13/2015  . Sacral decubitus ulcer    chronic; stage IV/notes 09/13/2015  . Senile dementia with delirium   . Urinary frequency     Past Surgical History:  Procedure Laterality Date  . CATARACT EXTRACTION, BILATERAL    . mediastinal lymp node dissection  01/10/2011   VAN TRIGT  . WEDGE RESECTION RUL NODULE  01/10/2011   VAN TRIGT    Patient Care Team: Gildardo Cranker, DO as PCP - General (Internal Medicine) Grant Fontana, MD (Inactive) (Internal Medicine) Ivin Poot, MD (Cardiothoracic Surgery)  Social History   Social History  . Marital status: Widowed    Spouse name: N/A  . Number of children: N/A  . Years of education: N/A   Occupational History  . Not on file.   Social History Main Topics  . Smoking status: Former Smoker    Packs/day: 0.50    Types: Cigarettes    Quit date: 06/09/2010  . Smokeless tobacco: Never Used  . Alcohol use No  . Drug use: No  . Sexual activity: No   Other Topics Concern  . Not on file   Social History Narrative  . No narrative on file     reports that she quit smoking about 5 years ago. Her smoking use included Cigarettes. She smoked 0.50 packs per day. She has never used smokeless tobacco. She reports that she does not drink alcohol  Patient ID: Stacey Fernandez, female   DOB: 1930-01-24, 82 y.o.   MRN: [id number]    Location:  [facility] Place of Service: OFFICE  Chief Complaint  Patient presents with  . Medical Management of Chronic Issues    2 week routine visit, Here with caregiver Malea    HPI:  82 yo female seen today for f/u. She was hospitalized at the end of May for UTI. She has a sacral decub ulcer and is getting wound care through [facility]. She uses a roho when seated. No pain. No f/c. Appetite reduced and she does not like ensure/boost. She is taking 1/2 tab remeron qhs. Weight down 9 lb since last OV. She is a poor historian due to dementia. Hx obtained from chart/caregiver.  She needs pneumovax. She had Prevnar in 2016. She declined tdap and shingles vaccines today  Dementia/anxiety/FTT/protein calorie malnutrition - takes namenda xr and remeron. No behavior disturbances. She has lost weight since last OV and refuses ensure/boost supplements. Albumin 2.8 in May 2017  Hyperlipidemia - stable on lipitor. LDL 85  COPD/hx lung cancer- released from oncology last year as she has not had recurrence of tumor.   HTN/edema - stable on 1/2 tab of lasix. Also takes ASA daily  Constipation - stable. BM q3days  Glaucoma - stable on eye gtts. followed by eye specialist  Past Medical History:  Diagnosis Date  . Anxiety state, unspecified   . Arthritis    "hands, feet" (09/13/2015)  . Chronic airway obstruction, not elsewhere classified   . Chronic kidney disease (CKD), stage II (mild)    [name] 09/13/2015  . COPD (chronic obstructive pulmonary disease) ([place])   . Dementia   . Functional constipation    [name] 09/13/2015  . Hemiplegia affecting dominant side, late effect of cerebrovascular disease   . Hyperlipidemia   . Lung cancer ([place])   . Lung mass   . Memory loss   . Mixed Alzheimer's and vascular dementia    /notes 09/13/2015  . Other malaise and fatigue   . Protein calorie malnutrition ([place])    [name] 09/13/2015  . Sacral decubitus ulcer    chronic; stage IV/notes 09/13/2015  . Senile dementia with delirium   . Urinary frequency     Past Surgical History:  Procedure Laterality Date  . CATARACT EXTRACTION, BILATERAL    . mediastinal lymp node dissection  01/10/2011   VAN TRIGT  . WEDGE RESECTION RUL NODULE  01/10/2011   VAN TRIGT    Patient Care Team: Gildardo Cranker, DO as PCP - General (Internal Medicine) Grant Fontana, MD (Inactive) (Internal Medicine) Ivin Poot, MD (Cardiothoracic Surgery)  Social History   Social History  . Marital status: Widowed    Spouse name: N/A  . Number of children: N/A  . Years of education: N/A   Occupational History  . Not on file.   Social History Main Topics  . Smoking status: Former Smoker    Packs/day: 0.50    Types: Cigarettes    Quit date: 06/09/2010  . Smokeless tobacco: Never Used  . Alcohol use No  . Drug use: No  . Sexual activity: No   Other Topics Concern  . Not on file   Social History Narrative  . No narrative on file     reports that she quit smoking about 5 years ago. Her smoking use included Cigarettes. She smoked 0.50 packs per day. She has never used smokeless tobacco. She reports that she does not drink alcohol  Patient ID: Stacey Fernandez, female   DOB: 1930-01-24, 82 y.o.   MRN: [id number]    Location:  [facility] Place of Service: OFFICE  Chief Complaint  Patient presents with  . Medical Management of Chronic Issues    2 week routine visit, Here with caregiver Malea    HPI:  82 yo female seen today for f/u. She was hospitalized at the end of May for UTI. She has a sacral decub ulcer and is getting wound care through [facility]. She uses a roho when seated. No pain. No f/c. Appetite reduced and she does not like ensure/boost. She is taking 1/2 tab remeron qhs. Weight down 9 lb since last OV. She is a poor historian due to dementia. Hx obtained from chart/caregiver.  She needs pneumovax. She had Prevnar in 2016. She declined tdap and shingles vaccines today  Dementia/anxiety/FTT/protein calorie malnutrition - takes namenda xr and remeron. No behavior disturbances. She has lost weight since last OV and refuses ensure/boost supplements. Albumin 2.8 in May 2017  Hyperlipidemia - stable on lipitor. LDL 85  COPD/hx lung cancer- released from oncology last year as she has not had recurrence of tumor.   HTN/edema - stable on 1/2 tab of lasix. Also takes ASA daily  Constipation - stable. BM q3days  Glaucoma - stable on eye gtts. followed by eye specialist  Past Medical History:  Diagnosis Date  . Anxiety state, unspecified   . Arthritis    "hands, feet" (09/13/2015)  . Chronic airway obstruction, not elsewhere classified   . Chronic kidney disease (CKD), stage II (mild)    [name] 09/13/2015  . COPD (chronic obstructive pulmonary disease) ([place])   . Dementia   . Functional constipation    [name] 09/13/2015  . Hemiplegia affecting dominant side, late effect of cerebrovascular disease   . Hyperlipidemia   . Lung cancer ([place])   . Lung mass   . Memory loss   . Mixed Alzheimer's and vascular dementia    /notes 09/13/2015  . Other malaise and fatigue   . Protein calorie malnutrition ([place])    [name] 09/13/2015  . Sacral decubitus ulcer    chronic; stage IV/notes 09/13/2015  . Senile dementia with delirium   . Urinary frequency     Past Surgical History:  Procedure Laterality Date  . CATARACT EXTRACTION, BILATERAL    . mediastinal lymp node dissection  01/10/2011   VAN TRIGT  . WEDGE RESECTION RUL NODULE  01/10/2011   VAN TRIGT    Patient Care Team: Gildardo Cranker, DO as PCP - General (Internal Medicine) Grant Fontana, MD (Inactive) (Internal Medicine) Ivin Poot, MD (Cardiothoracic Surgery)  Social History   Social History  . Marital status: Widowed    Spouse name: N/A  . Number of children: N/A  . Years of education: N/A   Occupational History  . Not on file.   Social History Main Topics  . Smoking status: Former Smoker    Packs/day: 0.50    Types: Cigarettes    Quit date: 06/09/2010  . Smokeless tobacco: Never Used  . Alcohol use No  . Drug use: No  . Sexual activity: No   Other Topics Concern  . Not on file   Social History Narrative  . No narrative on file     reports that she quit smoking about 5 years ago. Her smoking use included Cigarettes. She smoked 0.50 packs per day. She has never used smokeless tobacco. She reports that she does not drink alcohol  Patient ID: Stacey Fernandez, female   DOB: 1930-01-24, 82 y.o.   MRN: [id number]    Location:  [facility] Place of Service: OFFICE  Chief Complaint  Patient presents with  . Medical Management of Chronic Issues    2 week routine visit, Here with caregiver Malea    HPI:  82 yo female seen today for f/u. She was hospitalized at the end of May for UTI. She has a sacral decub ulcer and is getting wound care through [facility]. She uses a roho when seated. No pain. No f/c. Appetite reduced and she does not like ensure/boost. She is taking 1/2 tab remeron qhs. Weight down 9 lb since last OV. She is a poor historian due to dementia. Hx obtained from chart/caregiver.  She needs pneumovax. She had Prevnar in 2016. She declined tdap and shingles vaccines today  Dementia/anxiety/FTT/protein calorie malnutrition - takes namenda xr and remeron. No behavior disturbances. She has lost weight since last OV and refuses ensure/boost supplements. Albumin 2.8 in May 2017  Hyperlipidemia - stable on lipitor. LDL 85  COPD/hx lung cancer- released from oncology last year as she has not had recurrence of tumor.   HTN/edema - stable on 1/2 tab of lasix. Also takes ASA daily  Constipation - stable. BM q3days  Glaucoma - stable on eye gtts. followed by eye specialist  Past Medical History:  Diagnosis Date  . Anxiety state, unspecified   . Arthritis    "hands, feet" (09/13/2015)  . Chronic airway obstruction, not elsewhere classified   . Chronic kidney disease (CKD), stage II (mild)    [name] 09/13/2015  . COPD (chronic obstructive pulmonary disease) ([place])   . Dementia   . Functional constipation    [name] 09/13/2015  . Hemiplegia affecting dominant side, late effect of cerebrovascular disease   . Hyperlipidemia   . Lung cancer ([place])   . Lung mass   . Memory loss   . Mixed Alzheimer's and vascular dementia    /notes 09/13/2015  . Other malaise and fatigue   . Protein calorie malnutrition ([place])    [name] 09/13/2015  . Sacral decubitus ulcer    chronic; stage IV/notes 09/13/2015  . Senile dementia with delirium   . Urinary frequency     Past Surgical History:  Procedure Laterality Date  . CATARACT EXTRACTION, BILATERAL    . mediastinal lymp node dissection  01/10/2011   VAN TRIGT  . WEDGE RESECTION RUL NODULE  01/10/2011   VAN TRIGT    Patient Care Team: Gildardo Cranker, DO as PCP - General (Internal Medicine) Grant Fontana, MD (Inactive) (Internal Medicine) Ivin Poot, MD (Cardiothoracic Surgery)  Social History   Social History  . Marital status: Widowed    Spouse name: N/A  . Number of children: N/A  . Years of education: N/A   Occupational History  . Not on file.   Social History Main Topics  . Smoking status: Former Smoker    Packs/day: 0.50    Types: Cigarettes    Quit date: 06/09/2010  . Smokeless tobacco: Never Used  . Alcohol use No  . Drug use: No  . Sexual activity: No   Other Topics Concern  . Not on file   Social History Narrative  . No narrative on file     reports that she quit smoking about 5 years ago. Her smoking use included Cigarettes. She smoked 0.50 packs per day. She has never used smokeless tobacco. She reports that she does not drink alcohol  Patient ID: Stacey Fernandez, female   DOB: [birth year], 82 y.o.   MRN: [id number]    Location:  [facility] Place of Service: OFFICE  Chief Complaint  Patient presents with  . Medical Management of Chronic Issues    2 week routine visit, Here with caregiver Malea    HPI:  82 yo female seen today for f/u. She was hospitalized at the end of May for UTI. She has a sacral decub ulcer and is getting wound care through [facility] [place]. She uses a roho when seated. No pain. No f/c. Appetite reduced and she does not like ensure/boost. She is taking 1/2 tab remeron qhs. Weight down 9 lb since last OV. She is a poor historian due to dementia. Hx obtained from chart/caregiver.  She needs pneumovax. She had Prevnar in 2016. She declined tdap and shingles vaccines today  Dementia/anxiety/FTT/protein calorie malnutrition - takes namenda xr and remeron. No behavior disturbances. She has lost weight since last OV and refuses ensure/boost supplements. Albumin 2.8 in May 2017  Hyperlipidemia - stable on lipitor. LDL 85  COPD/hx lung cancer- released from oncology last year as she has not had recurrence of tumor.   HTN/edema - stable on 1/2 tab of lasix. Also takes ASA daily  Constipation - stable. BM q3days  Glaucoma - stable on eye gtts. followed by eye specialist  Past Medical History:  Diagnosis Date  . Anxiety state, unspecified   . Arthritis    "hands, feet" (09/13/2015)  . Chronic airway obstruction, not elsewhere classified   . Chronic kidney disease (CKD), stage II (mild)    [name] 09/13/2015  . COPD (chronic obstructive pulmonary disease) ([place])   . Dementia   . Functional constipation    [name] 09/13/2015  . Hemiplegia affecting dominant side, late effect of cerebrovascular disease   . Hyperlipidemia   . Lung cancer ([place])   . Lung mass   . Memory loss   . Mixed Alzheimer's and vascular dementia    /notes 09/13/2015  . Other malaise and fatigue   . Protein calorie malnutrition ([place])    [name] 09/13/2015  . Sacral decubitus ulcer    chronic; stage IV/notes 09/13/2015  . Senile dementia with delirium   . Urinary frequency     Past Surgical History:  Procedure Laterality Date  . CATARACT EXTRACTION, BILATERAL    . mediastinal lymp node dissection  01/10/2011   VAN TRIGT  . WEDGE RESECTION RUL NODULE  01/10/2011   VAN TRIGT    Patient Care Team: Gildardo Cranker, DO as PCP - General (Internal Medicine) Grant Fontana, MD (Inactive) (Internal Medicine) Ivin Poot, MD (Cardiothoracic Surgery)  Social History   Social History  . Marital status: Widowed    Spouse name: N/A  . Number of children: N/A  . Years of education: N/A   Occupational History  . Not on file.   Social History Main Topics  . Smoking status: Former Smoker    Packs/day: 0.50    Types: Cigarettes    Quit date: 06/09/2010  . Smokeless tobacco: Never Used  . Alcohol use No  . Drug use: No  . Sexual activity: No   Other Topics Concern  . Not on file   Social History Narrative  . No narrative on file     reports that she quit smoking about 5 years ago. Her smoking use included Cigarettes. She smoked 0.50 packs per day. She has never used smokeless tobacco. She reports that she does not drink alcohol

## 2015-11-23 NOTE — Patient Instructions (Signed)
Will call with lab results  Pneumovax vaccine given today  Increase mirtazepine to 1 tab at bedtime for appetite stimulant  Continue current medications as ordered  Follow up in 3 mos for routine visit

## 2015-11-24 LAB — COMPLETE METABOLIC PANEL WITH GFR
ALBUMIN: 3.7 g/dL (ref 3.6–5.1)
ALK PHOS: 77 U/L (ref 33–130)
ALT: 69 U/L — AB (ref 6–29)
AST: 56 U/L — AB (ref 10–35)
BUN: 21 mg/dL (ref 7–25)
CALCIUM: 8.9 mg/dL (ref 8.6–10.4)
CO2: 20 mmol/L (ref 20–31)
CREATININE: 0.84 mg/dL (ref 0.60–0.88)
Chloride: 114 mmol/L — ABNORMAL HIGH (ref 98–110)
GFR, Est African American: 74 mL/min (ref >=60)
GFR, Est Non African American: 64 mL/min (ref >=60)
GLUCOSE: 79 mg/dL (ref 65–99)
POTASSIUM: 3.6 mmol/L (ref 3.5–5.3)
SODIUM: 147 mmol/L — AB (ref 135–146)
TOTAL PROTEIN: 7 g/dL (ref 6.1–8.1)
Total Bilirubin: 0.3 mg/dL (ref 0.2–1.2)

## 2015-11-24 LAB — LIPID PANEL
CHOL/HDL RATIO: 2.5 ratio (ref <=5.0)
CHOLESTEROL: 156 mg/dL (ref 125–200)
HDL: 62 mg/dL (ref >=46)
LDL Cholesterol: 84 mg/dL (ref <130)
Triglycerides: 52 mg/dL (ref <150)
VLDL: 10 mg/dL (ref <30)

## 2015-11-29 ENCOUNTER — Other Ambulatory Visit: Payer: Self-pay

## 2015-11-29 DIAGNOSIS — R748 Abnormal levels of other serum enzymes: Secondary | ICD-10-CM

## 2015-12-08 ENCOUNTER — Encounter (HOSPITAL_COMMUNITY): Payer: Self-pay

## 2015-12-08 ENCOUNTER — Emergency Department (HOSPITAL_COMMUNITY)
Admission: EM | Admit: 2015-12-08 | Discharge: 2015-12-08 | Disposition: A | Discharge status: Home / Self Care | Payer: Medicare Other | Attending: Emergency Medicine | Admitting: Emergency Medicine

## 2015-12-08 ENCOUNTER — Emergency Department (HOSPITAL_COMMUNITY): Payer: Medicare Other

## 2015-12-08 DIAGNOSIS — I5032 Chronic diastolic (congestive) heart failure: Secondary | ICD-10-CM | POA: Insufficient documentation

## 2015-12-08 DIAGNOSIS — W19XXXA Unspecified fall, initial encounter: Secondary | ICD-10-CM | POA: Diagnosis not present

## 2015-12-08 DIAGNOSIS — Y999 Unspecified external cause status: Secondary | ICD-10-CM | POA: Diagnosis not present

## 2015-12-08 DIAGNOSIS — Y929 Unspecified place or not applicable: Secondary | ICD-10-CM | POA: Diagnosis not present

## 2015-12-08 DIAGNOSIS — Z87891 Personal history of nicotine dependence: Secondary | ICD-10-CM | POA: Diagnosis not present

## 2015-12-08 DIAGNOSIS — Y939 Activity, unspecified: Secondary | ICD-10-CM | POA: Insufficient documentation

## 2015-12-08 DIAGNOSIS — S0003XA Contusion of scalp, initial encounter: Secondary | ICD-10-CM | POA: Diagnosis not present

## 2015-12-08 DIAGNOSIS — I130 Hypertensive heart and chronic kidney disease with heart failure and stage 1 through stage 4 chronic kidney disease, or unspecified chronic kidney disease: Secondary | ICD-10-CM | POA: Insufficient documentation

## 2015-12-08 DIAGNOSIS — Z79899 Other long term (current) drug therapy: Secondary | ICD-10-CM | POA: Diagnosis not present

## 2015-12-08 DIAGNOSIS — N182 Chronic kidney disease, stage 2 (mild): Secondary | ICD-10-CM | POA: Diagnosis not present

## 2015-12-08 DIAGNOSIS — J449 Chronic obstructive pulmonary disease, unspecified: Secondary | ICD-10-CM | POA: Diagnosis not present

## 2015-12-08 DIAGNOSIS — Z85118 Personal history of other malignant neoplasm of bronchus and lung: Secondary | ICD-10-CM | POA: Insufficient documentation

## 2015-12-08 DIAGNOSIS — G309 Alzheimer's disease, unspecified: Secondary | ICD-10-CM | POA: Diagnosis not present

## 2015-12-08 DIAGNOSIS — S0990XA Unspecified injury of head, initial encounter: Secondary | ICD-10-CM

## 2015-12-08 DIAGNOSIS — Z7982 Long term (current) use of aspirin: Secondary | ICD-10-CM | POA: Insufficient documentation

## 2015-12-08 NOTE — Discharge Instructions (Signed)
Your CTs did not show any sign of new injury today. Follow up with your PCP as needed. Return to the ED if you experience severe worsening of your symptoms, repeat falls, change in vision, vomiting.

## 2015-12-08 NOTE — ED Triage Notes (Signed)
Patient's family member states that the patient was found at the foot of a bed where the foot board is. Patient has a hematoma to the back of the head. No bleeding. No other injuries noted.

## 2015-12-08 NOTE — ED Provider Notes (Signed)
[facility] DEPT Provider Note   CSN: [id number] Arrival date & time: 12/08/15  1041     History   Chief Complaint Chief Complaint  Patient presents with  . Head Injury    HPI Stacey Fernandez is a 82 y.o. female with a past medical history of dementia who presents to the ED today to be evaluated for possible head injury. Level V caveat, dementia. Patient's niece states that this morning when she went to check on the patient she was laying with her head at the foot of the bed resting on the foot board. Pts niece noticed some swelling to the back of pts head so she became concerned for head injury. Pt is not anticoagulated. Pt is mentating at baseline per niece. Pt has been ambulatory since the presumed head injury. Pt currently denies any pain and has no complaints.   HPI  Past Medical History:  Diagnosis Date  . Anxiety state, unspecified   . Arthritis    "hands, feet" (09/13/2015)  . Chronic airway obstruction, not elsewhere classified   . Chronic kidney disease (CKD), stage II (mild)    [name] 09/13/2015  . COPD (chronic obstructive pulmonary disease) ([place])   . Dementia   . Functional constipation    [name] 09/13/2015  . Hemiplegia affecting dominant side, late effect of cerebrovascular disease   . Hyperlipidemia   . Lung cancer ([place])   . Lung mass   . Memory loss   . Mixed Alzheimer's and vascular dementia    /notes 09/13/2015  . Other malaise and fatigue   . Protein calorie malnutrition ([place])    [name] 09/13/2015  . Sacral decubitus ulcer    chronic; stage IV/notes 09/13/2015  . Senile dementia with delirium   . Urinary frequency     Patient Active Problem List   Diagnosis Date Noted  . Palliative care encounter   . Protein-calorie malnutrition, severe 09/14/2015  . CKD (chronic kidney disease), stage II 09/13/2015  . AKI (acute kidney injury) ([place]) 09/13/2015  . Dehydration with hypernatremia 09/13/2015  . Metabolic encephalopathy 16/04/930  . Chronic  diastolic heart failure, NYHA class 1 ([place]) 09/13/2015  . Diarrhea 09/13/2015  . Prolonged Q-T interval on ECG 09/13/2015  . Altered mental status   . Decubitus ulcer of sacral region, stage 4 ([place]) 09/06/2015  . Mixed Alzheimer's and vascular dementia 12/01/2013  . Essential hypertension 12/01/2013  . Nail hypertrophy 12/01/2013  . Protein-calorie malnutrition, moderate ([place]) 09/08/2013  . Functional constipation 09/08/2013  . Edema 07/28/2013  . Allergic rhinitis 07/28/2013  . Dyspnea on exertion 02/04/2013  . Impaired renal function 09/23/2012  . Urinary frequency 07/29/2012  . Malignant neoplasm of bronchus and lung, unspecified site 07/29/2012  . Anxiety state, unspecified 07/29/2012  . Primary pulmonary hypertension ([place]) 07/29/2012  . Chronic airway obstruction, not elsewhere classified 07/29/2012  . Other malaise and fatigue 07/29/2012  . Hyperlipidemia     Past Surgical History:  Procedure Laterality Date  . CATARACT EXTRACTION, BILATERAL    . mediastinal lymp node dissection  01/10/2011   VAN TRIGT  . WEDGE RESECTION RUL NODULE  01/10/2011   VAN TRIGT    OB History    No data available       Home Medications    Prior to Admission medications   Medication Sig Start Date End Date Taking? Authorizing Provider  AMBULATORY NON FORMULARY MEDICATION 1. Roho Cushion Dx: C928747 2. Transfer Tub Bench Dx: R53.81 10/11/15   Gildardo Cranker, DO  aspirin 81  MG tablet Take 81 mg by mouth daily.    Historical Provider, MD  atorvastatin (LIPITOR) 10 MG tablet TAKE 1 TABLET BY MOUTH EVERY DAY 02/14/15   Lauree Chandler, NP  furosemide (LASIX) 20 MG tablet TAKE 0.5 TABLETS (10 MG TOTAL) BY MOUTH DAILY. 07/01/15   Monica Eulas Post, DO  latanoprost (XALATAN) 0.005 % ophthalmic solution PLACE 1 DROP INTO BOTH EYES AT BEDTIME. 02/15/14   Tiffany L Reed, DO  mirtazapine (REMERON) 15 MG tablet Take 1 tablet (15 mg total) by mouth at bedtime. 11/23/15   Monica Carter, DO  NAMENDA XR 28 MG  CP24 24 hr capsule TAKE ONE CAPSULE BY MOUTH EVERY DAY 10/31/15   Gildardo Cranker, DO  saccharomyces boulardii (FLORASTOR) 250 MG capsule Take 1 capsule (250 mg total) by mouth 2 (two) times daily. 09/17/15   Costin Karlyne Greenspan, MD  sodium chloride (OCEAN) 0.65 % SOLN nasal spray Place 1 spray into both nostrils as needed for congestion. 07/28/13   Blanchie Serve, MD  TRUSOPT 2 % ophthalmic solution Place 2 drops into the right eye 2 (two) times daily.  08/20/14   Historical Provider, MD    Family History Family History  Problem Relation Age of Onset  . Heart disease Mother   . Cancer Father   . Heart disease Sister   . Heart disease Brother     Social History Social History  Substance Use Topics  . Smoking status: Former Smoker    Packs/day: 0.50    Types: Cigarettes    Quit date: 06/09/2010  . Smokeless tobacco: Never Used  . Alcohol use No     Allergies   Review of patient's allergies indicates no known allergies.   Review of Systems Review of Systems  Unable to perform ROS: Dementia     Physical Exam Updated Vital Signs BP 165/96 (BP Location: Right Arm)   Pulse 75   Temp 97.4 F (36.3 C) (Axillary)   Resp 16   Ht 5' (1.524 m)   Wt 40.4 kg   SpO2 100%   BMI 17.38 kg/m   Physical Exam  Constitutional: No distress.  Elderly, frail  HENT:  Head: Normocephalic.  Mouth/Throat: No oropharyngeal exudate.  Small hematoma to posterior scalp. No battle sign. No raccoon eyes. No hemotympanum.  Eyes: Conjunctivae and EOM are normal. Pupils are equal, round, and reactive to light. Right eye exhibits no discharge. Left eye exhibits no discharge. No scleral icterus.  Cardiovascular: Normal rate, regular rhythm, normal heart sounds and intact distal pulses.  Exam reveals no gallop and no friction rub.   No murmur heard. Pulmonary/Chest: Effort normal and breath sounds normal. No respiratory distress. She has no wheezes. She has no rales. She exhibits no tenderness.  Abdominal:  Soft. She exhibits no distension. There is no tenderness. There is no guarding.  Musculoskeletal: Normal range of motion. She exhibits no edema.  Neurological: She is alert. No cranial nerve deficit.  Disoriented. Difficult to assess neuro status due to difficulty following commands from dementia.  Skin: Skin is warm and dry. No rash noted. She is not diaphoretic. No erythema. No pallor.  Psychiatric: She has a normal mood and affect. Her behavior is normal.  Nursing note and vitals reviewed.    ED Treatments / Results  Labs (all labs ordered are listed, but only abnormal results are displayed) Labs Reviewed - No data to display  EKG  EKG Interpretation None       Radiology Ct Head Wo Contrast  Result Date: 12/08/2015 CLINICAL DATA:  Posterior scalp hematoma after being found on floor. Dementia. EXAM: CT HEAD WITHOUT CONTRAST CT CERVICAL SPINE WITHOUT CONTRAST TECHNIQUE: Multidetector CT imaging of the head and cervical spine was performed following the standard protocol without intravenous contrast. Multiplanar CT image reconstructions of the cervical spine were also generated. COMPARISON:  CT scan of head of Sep 13, 2015. FINDINGS: CT HEAD FINDINGS Bony calvarium appears intact. Mild diffuse cortical atrophy is noted. Mild chronic ischemic white matter disease is noted. No mass effect or midline shift is noted. Ventricular size is within normal limits. There is no evidence of mass lesion, hemorrhage or acute infarction. CT CERVICAL SPINE FINDINGS No fracture is noted. Grade 1 anterolisthesis of C4-5 is noted secondary to posterior facet joint hypertrophy. Moderate degenerative disc disease is noted at C5-6. Visualized lung apices are unremarkable. IMPRESSION: Mild diffuse cortical atrophy. Mild chronic ischemic white matter disease. No acute intracranial abnormality seen. Moderate degenerative disc disease is noted at C5-6. No acute abnormality seen in the cervical spine. Electronically  Signed   By: Marijo Conception, M.D.   On: 12/08/2015 12:41   Ct Cervical Spine Wo Contrast  Result Date: 12/08/2015 CLINICAL DATA:  Posterior scalp hematoma after being found on floor. Dementia. EXAM: CT HEAD WITHOUT CONTRAST CT CERVICAL SPINE WITHOUT CONTRAST TECHNIQUE: Multidetector CT imaging of the head and cervical spine was performed following the standard protocol without intravenous contrast. Multiplanar CT image reconstructions of the cervical spine were also generated. COMPARISON:  CT scan of head of Sep 13, 2015. FINDINGS: CT HEAD FINDINGS Bony calvarium appears intact. Mild diffuse cortical atrophy is noted. Mild chronic ischemic white matter disease is noted. No mass effect or midline shift is noted. Ventricular size is within normal limits. There is no evidence of mass lesion, hemorrhage or acute infarction. CT CERVICAL SPINE FINDINGS No fracture is noted. Grade 1 anterolisthesis of C4-5 is noted secondary to posterior facet joint hypertrophy. Moderate degenerative disc disease is noted at C5-6. Visualized lung apices are unremarkable. IMPRESSION: Mild diffuse cortical atrophy. Mild chronic ischemic white matter disease. No acute intracranial abnormality seen. Moderate degenerative disc disease is noted at C5-6. No acute abnormality seen in the cervical spine. Electronically Signed   By: Marijo Conception, M.D.   On: 12/08/2015 12:41    Procedures Procedures (including critical care time)  Medications Ordered in ED Medications - No data to display   Initial Impression / Assessment and Plan / ED Course  I have reviewed the triage vital signs and the nursing notes.  Pertinent labs & imaging results that were available during my care of the patient were reviewed by me and considered in my medical decision making (see chart for details).  Clinical Course    82 year old female with advanced dementia presents to the ED today for possible head injury. Patient was found with her head at the  foot of her bed this morning. Hall hematoma noted to posterior scalp. Patient is not anticoagulated. Patient is severely demented and unable to provide history. Mentation is at baseline per family members. CT head and cervical spine negative for acute injury. Patient is able to ambulate. For that she is safe for discharge with PCP follow-up.  Patient was discussed with and seen by Dr. Oleta Mouse who agrees with the treatment plan.    Final Clinical Impressions(s) / ED Diagnoses   Final diagnoses:  Fall    New Prescriptions New Prescriptions   No medications on file  Dondra Spry Gary, PA-C 12/08/15 [place] Liu, MD 12/08/15 1332

## 2015-12-12 ENCOUNTER — Ambulatory Visit
Admission: RE | Admit: 2015-12-12 | Discharge: 2015-12-12 | Disposition: A | Discharge status: Home / Self Care | Payer: Medicare Other | Source: Ambulatory Visit | Attending: Internal Medicine | Admitting: Internal Medicine

## 2015-12-12 DIAGNOSIS — R748 Abnormal levels of other serum enzymes: Secondary | ICD-10-CM

## 2015-12-13 ENCOUNTER — Ambulatory Visit (INDEPENDENT_AMBULATORY_CARE_PROVIDER_SITE_OTHER): Payer: Medicare Other | Admitting: Nurse Practitioner

## 2015-12-13 ENCOUNTER — Encounter: Payer: Self-pay | Admitting: Nurse Practitioner

## 2015-12-13 ENCOUNTER — Other Ambulatory Visit: Payer: Self-pay

## 2015-12-13 VITALS — BP 116/72 | HR 71 | Temp 97.8°F | Resp 17 | Ht 60.0 in | Wt 92.2 lb

## 2015-12-13 DIAGNOSIS — E870 Hyperosmolality and hypernatremia: Secondary | ICD-10-CM

## 2015-12-13 DIAGNOSIS — E43 Unspecified severe protein-calorie malnutrition: Secondary | ICD-10-CM

## 2015-12-13 DIAGNOSIS — G309 Alzheimer's disease, unspecified: Secondary | ICD-10-CM

## 2015-12-13 DIAGNOSIS — F0150 Vascular dementia without behavioral disturbance: Secondary | ICD-10-CM

## 2015-12-13 DIAGNOSIS — F0280 Dementia in other diseases classified elsewhere without behavioral disturbance: Secondary | ICD-10-CM

## 2015-12-13 DIAGNOSIS — E860 Dehydration: Secondary | ICD-10-CM

## 2015-12-13 NOTE — Patient Instructions (Signed)
Resource- Medical sales representative hydration  Will get lab work today

## 2015-12-13 NOTE — Progress Notes (Signed)
Careteam: Patient Care Team: Gildardo Cranker, DO as PCP - General (Internal Medicine) Grant Fontana, MD (Inactive) (Internal Medicine) Ivin Poot, MD (Cardiothoracic Surgery)  Advanced Directive information Does patient have an advance directive?: Yes, Type of Advance Directive: [place];Living will  No Known Allergies  Chief Complaint  Patient presents with  . Acute Visit    Caregiver states that patient is acting strange. Pt talking in her sleep, tossing and turning at night. Patient hit her head on foot board after sitting up in bed. Went to ED and all was clear.      HPI: Patient is a 82 y.o. female seen in the office today due to "acting strange" The patient was brought to the ED by her niece for concern of head injury. She was found in bed at the height of the bed with her head against the footboard. There was a tiny hematoma over the back of the head.  She had baseline mental status.  She is grossly neurologically intact on exam with stable vital signs. She has a unremarkable CT head and cervical spine.  At last OV pt had significant weight loss and Remeron was increased to 15 mg and caregiver questions if this is why she is more restless in the morning.  She has put on 3 lbs since last OV which is beneficial. Eating better and staying hydrated.  No abdominal pain, but had episode of diarrhea, today is day 3 and it is improving.  No fevers.  No cough or congestion.  Acting fine throughout the day just early morning restlessness. No increase in behaviors.   Review of Systems:  Review of Systems  Unable to perform ROS: Dementia   Past Medical History:  Diagnosis Date  . Anxiety state, unspecified   . Arthritis    "hands, feet" (09/13/2015)  . Chronic airway obstruction, not elsewhere classified   . Chronic kidney disease (CKD), stage II (mild)    [name] 09/13/2015  . COPD (chronic obstructive pulmonary disease) ([place])   . Dementia   .  Functional constipation    [name] 09/13/2015  . Hemiplegia affecting dominant side, late effect of cerebrovascular disease   . Hyperlipidemia   . Lung cancer ([place])   . Lung mass   . Memory loss   . Mixed Alzheimer's and vascular dementia    /notes 09/13/2015  . Other malaise and fatigue   . Protein calorie malnutrition ([place])    [name] 09/13/2015  . Sacral decubitus ulcer    chronic; stage IV/notes 09/13/2015  . Senile dementia with delirium   . Urinary frequency    Past Surgical History:  Procedure Laterality Date  . CATARACT EXTRACTION, BILATERAL    . mediastinal lymp node dissection  01/10/2011   VAN TRIGT  . WEDGE RESECTION RUL NODULE  01/10/2011   VAN TRIGT   Social History:   reports that she quit smoking about 5 years ago. Her smoking use included Cigarettes. She smoked 0.50 packs per day. She has never used smokeless tobacco. She reports that she does not drink alcohol or use drugs.  Family History  Problem Relation Age of Onset  . Heart disease Mother   . Cancer Father   . Heart disease Sister   . Heart disease Brother     Medications: Patient's Medications  New Prescriptions   No medications on file  Previous Medications   AMBULATORY NON FORMULARY MEDICATION    1. Roho Cushion Dx: [id number] 2. Transfer Tub [employer]  Dx: R53.81   ASPIRIN EC 81 MG TABLET    Take 81 mg by mouth at bedtime.   ATORVASTATIN (LIPITOR) 10 MG TABLET    TAKE 1 TABLET BY MOUTH EVERY DAY   FUROSEMIDE (LASIX) 20 MG TABLET    TAKE 0.5 TABLETS (10 MG TOTAL) BY MOUTH DAILY.   LATANOPROST (XALATAN) 0.005 % OPHTHALMIC SOLUTION    PLACE 1 DROP INTO BOTH EYES AT BEDTIME.   MIRTAZAPINE (REMERON) 15 MG TABLET    Take 1 tablet (15 mg total) by mouth at bedtime.   NAMENDA XR 28 MG CP24 24 HR CAPSULE    TAKE ONE CAPSULE BY MOUTH EVERY DAY   SODIUM CHLORIDE (OCEAN) 0.65 % SOLN NASAL SPRAY    Place 1 spray into both nostrils as needed for congestion.   TRUSOPT 2 % OPHTHALMIC SOLUTION    Place 2 drops into the  right eye 2 (two) times daily.   Modified Medications   No medications on file  Discontinued Medications   SACCHAROMYCES BOULARDII (FLORASTOR) 250 MG CAPSULE    Take 1 capsule (250 mg total) by mouth 2 (two) times daily.     Physical Exam:  Vitals:   12/13/15 1458  BP: 116/72  Pulse: 71  Resp: 17  Temp: 97.8 F (36.6 C)  TempSrc: Oral  Weight: 92 lb 3.2 oz (41.8 kg)  Height: 5' (1.524 m)   Body mass index is 18.01 kg/m.  Physical Exam  Constitutional:  Frail appearing in NAD.   HENT:  Mouth/Throat: Oropharynx is clear and moist. No oropharyngeal exudate.  Eyes: Pupils are equal, round, and reactive to light. No scleral icterus.  Neck: Neck supple. Carotid bruit is not present.  Cardiovascular: Normal rate, regular rhythm and normal heart sounds.  Exam reveals no gallop and no friction rub.   No murmur heard. Pulmonary/Chest: Effort normal and breath sounds normal.  Abdominal: Soft. Bowel sounds are normal. There is no hepatomegaly.  Musculoskeletal: She exhibits edema (trace edema right > left ).  Neurological: She is alert.  Significant dementia  Psychiatric: She has a normal mood and affect. Her behavior is normal.   Labs reviewed: Basic Metabolic Panel:  Recent Labs  09/13/15 1805  09/16/15 0614 09/17/15 0446 11/23/15 1520  NA  --   < > 136 139 147*  K  --   < > 3.4* 3.6 3.6  CL  --   < > 108 111 114*  CO2  --   < > _0 GLUCOSE  --   < > 96 94 79  BUN  --   < > _1 CREATININE  --   < > 0.80 0.67 0.84  CALCIUM  --   < > 8.1* 8.3* 8.9  MG 2.4  --   --   --   --   PHOS 3.1  --   --   --   --   TSH 1.518  --   --   --   --   < > = values in this interval not displayed. Liver Function Tests:  Recent Labs  09/13/15 1150 09/14/15 0520 11/23/15 1520  AST 32 27 56*  ALT 30 24 69*  ALKPHOS 123 96 77  BILITOT 0.6 0.7 0.3  PROT 7.4 6.3* 7.0  ALBUMIN 3.4* 2.8* 3.7   No results for input(s): LIPASE, AMYLASE in the last 8760 hours. No  results for input(s): AMMONIA in the last 8760 hours. CBC:  Recent Labs  03/04/15 0847 09/13/15 1150  09/15/15 0543 09/16/15 0614 09/17/15 0446  WBC 4.5 16.5*  < > 11.6* 8.9 8.1  NEUTROABS 1.6 12.8*  --   --   --   --   HGB  --  14.3  < > 10.3* 12.0 11.1*  HCT 40.8 44.8  < > 33.3* 38.3 35.4*  MCV 90 91.6  < > 91.7 90.3 89.6  PLT 176 183  < > 164 167 165  < > = values in this interval not displayed. Lipid Panel:  Recent Labs  03/04/15 0847 11/23/15 1520  CHOL 154 156  HDL 56 62  LDLCALC 85 84  TRIG 64 52  CHOLHDL 2.8 2.5   TSH:  Recent Labs  09/13/15 1805  TSH 1.518   A1C: No results found for: HGBA1C   Assessment/Plan 1. Protein-calorie malnutrition, severe Benefiting from increased Remeron. Will cont current dose.  -to start Breeze nutritional supplement  CBC with Differential/Platelets - BMP with eGFR  2. Hypernatremia -noted on last labs, will follow up - BMP with eGFR  3. Alzheimer's dementia without behavioral disturbance -encouraged caregiver to get pt out of the bed earlier if she is starting to become restless by staying in bed  (normally gets up 9-930) Possible getting hunger earlier due to remeron, adding evening snack or supplement before bed  -will follow up labs at this time - CBC with Differential/Platelets - BMP with eGFR   Jessica K. Harle Battiest  [facility] [phone] 8 am - 5 pm) [phone] (after hours)

## 2015-12-16 ENCOUNTER — Other Ambulatory Visit: Payer: Medicare Other

## 2015-12-16 DIAGNOSIS — E43 Unspecified severe protein-calorie malnutrition: Secondary | ICD-10-CM

## 2015-12-16 DIAGNOSIS — E870 Hyperosmolality and hypernatremia: Secondary | ICD-10-CM

## 2015-12-16 DIAGNOSIS — F0150 Vascular dementia without behavioral disturbance: Secondary | ICD-10-CM

## 2015-12-16 DIAGNOSIS — F0280 Dementia in other diseases classified elsewhere without behavioral disturbance: Secondary | ICD-10-CM

## 2015-12-16 DIAGNOSIS — G309 Alzheimer's disease, unspecified: Secondary | ICD-10-CM

## 2015-12-16 LAB — CBC WITH DIFFERENTIAL/PLATELET
BASOS ABS: 52 {cells}/uL (ref 0–200)
Basophils Relative: 1 %
EOS PCT: 2 %
Eosinophils Absolute: 104 cells/uL (ref 15–500)
HEMATOCRIT: 39.2 % (ref 35.0–45.0)
HEMOGLOBIN: 12.5 g/dL (ref 11.7–15.5)
LYMPHS ABS: 2600 {cells}/uL (ref 850–3900)
Lymphocytes Relative: 50 %
MCH: 28.9 pg (ref 27.0–33.0)
MCHC: 31.9 g/dL — AB (ref 32.0–36.0)
MCV: 90.7 fL (ref 80.0–100.0)
MONO ABS: 312 {cells}/uL (ref 200–950)
MPV: 13 fL — ABNORMAL HIGH (ref 7.5–12.5)
Monocytes Relative: 6 %
NEUTROS PCT: 41 %
Neutro Abs: 2132 cells/uL (ref 1500–7800)
Platelets: 164 10*3/uL (ref 140–400)
RBC: 4.32 MIL/uL (ref 3.80–5.10)
RDW: 15 % (ref 11.0–15.0)
WBC: 5.2 10*3/uL (ref 3.8–10.8)

## 2015-12-16 LAB — BASIC METABOLIC PANEL WITH GFR
BUN: 20 mg/dL (ref 7–25)
CALCIUM: 8.5 mg/dL — AB (ref 8.6–10.4)
CO2: 22 mmol/L (ref 20–31)
CREATININE: 0.94 mg/dL — AB (ref 0.60–0.88)
Chloride: 111 mmol/L — ABNORMAL HIGH (ref 98–110)
GFR, Est African American: 64 mL/min (ref >=60)
GFR, Est Non African American: 56 mL/min — ABNORMAL LOW (ref >=60)
GLUCOSE: 99 mg/dL (ref 65–99)
Potassium: 3.9 mmol/L (ref 3.5–5.3)
Sodium: 142 mmol/L (ref 135–146)

## 2015-12-31 ENCOUNTER — Other Ambulatory Visit: Payer: Self-pay | Admitting: Nurse Practitioner

## 2016-02-13 ENCOUNTER — Other Ambulatory Visit: Payer: Self-pay | Admitting: Internal Medicine

## 2016-02-24 ENCOUNTER — Encounter: Payer: Self-pay | Admitting: Internal Medicine

## 2016-02-24 ENCOUNTER — Ambulatory Visit (INDEPENDENT_AMBULATORY_CARE_PROVIDER_SITE_OTHER): Payer: Medicare Other | Admitting: Internal Medicine

## 2016-02-24 VITALS — BP 122/68 | HR 71 | Temp 97.8°F | Ht 60.0 in | Wt 91.0 lb

## 2016-02-24 DIAGNOSIS — G309 Alzheimer's disease, unspecified: Secondary | ICD-10-CM

## 2016-02-24 DIAGNOSIS — E782 Mixed hyperlipidemia: Secondary | ICD-10-CM | POA: Diagnosis not present

## 2016-02-24 DIAGNOSIS — I5032 Chronic diastolic (congestive) heart failure: Secondary | ICD-10-CM | POA: Diagnosis not present

## 2016-02-24 DIAGNOSIS — I10 Essential (primary) hypertension: Secondary | ICD-10-CM

## 2016-02-24 DIAGNOSIS — R627 Adult failure to thrive: Secondary | ICD-10-CM | POA: Diagnosis not present

## 2016-02-24 DIAGNOSIS — Z23 Encounter for immunization: Secondary | ICD-10-CM

## 2016-02-24 DIAGNOSIS — F0280 Dementia in other diseases classified elsewhere without behavioral disturbance: Secondary | ICD-10-CM

## 2016-02-24 NOTE — Patient Instructions (Signed)
Continue current medications as ordered  Follow up in 3 mos for routine visit.   Flu shot given today

## 2016-02-24 NOTE — Progress Notes (Signed)
Patient ID: Stacey Fernandez, female   DOB: 04-20-1929, 82 y.o.   MRN: [id number]    Location:  [facility] Place of Service: OFFICE  Chief Complaint  Patient presents with  . Medical Management of Chronic Issues    3 month routine visit  . Flu Vaccine    discuss flu vaccine    HPI:  82 yo female seen today for f/u. She has no concerns. Home care aid present. She is a poor historian due to dementia. Hx obtained from chart  Dementia/anxiety/FTT/protein calorie malnutrition - takes namenda xr and remeron. No behavior disturbances. Weight 91 lbs (up from 89 lbs). She refuses ensure/boost supplements. Albumin 3.7 in May 2017. Na improved from 147-->142 with increased fluids  Hyperlipidemia - stable on lipitor. LDL 84  COPD/hx lung cancer- released from oncology last year as she has not had recurrence of tumor.   HTN/edema - stable on 1/2 tab of lasix. Also takes ASA daily  Constipation - stable. BM q3days  Glaucoma - stable on eye gtts. followed by eye specialist  Past Medical History:  Diagnosis Date  . Anxiety state, unspecified   . Arthritis    "hands, feet" (09/13/2015)  . Chronic airway obstruction, not elsewhere classified   . Chronic kidney disease (CKD), stage II (mild)    Hattie Perch 09/13/2015  . COPD (chronic obstructive pulmonary disease) (HCC)   . Dementia   . Functional constipation    Hattie Perch 09/13/2015  . Hemiplegia affecting dominant side, late effect of cerebrovascular disease   . Hyperlipidemia   . Lung cancer (HCC)   . Lung mass   . Memory loss   . Mixed Alzheimer's and vascular dementia    /notes 09/13/2015  . Other malaise and fatigue   . Protein calorie malnutrition (HCC)    Hattie Perch 09/13/2015  . Sacral decubitus ulcer    chronic; stage IV/notes 09/13/2015  . Senile dementia with delirium   . Urinary frequency     Past Surgical History:  Procedure Laterality Date  . CATARACT EXTRACTION, BILATERAL    . mediastinal lymp node dissection  01/10/2011   VAN TRIGT   . WEDGE RESECTION RUL NODULE  01/10/2011   VAN TRIGT    Patient Care Team: Kirt Boys, DO as PCP - General (Internal Medicine) Jeri Cos, MD (Inactive) (Internal Medicine) Kerin Perna, MD (Cardiothoracic Surgery)  Social History   Social History  . Marital status: Widowed    Spouse name: N/A  . Number of children: N/A  . Years of education: N/A   Occupational History  . Not on file.   Social History Main Topics  . Smoking status: Former Smoker    Packs/day: 0.50    Types: Cigarettes    Quit date: 06/09/2010  . Smokeless tobacco: Never Used  . Alcohol use No  . Drug use: No  . Sexual activity: No   Other Topics Concern  . Not on file   Social History Narrative  . No narrative on file     reports that she quit smoking about 5 years ago. Her smoking use included Cigarettes. She smoked 0.50 packs per day. She has never used smokeless tobacco. She reports that she does not drink alcohol or use drugs.  Family History  Problem Relation Age of Onset  . Heart disease Mother   . Cancer Father   . Heart disease Sister   . Heart disease Brother    Family Status  Relation Status  . Mother Deceased  . Father Deceased  .  Sister Deceased  . Brother Deceased  . Sister Alive   28  . Brother Alive   60  . Sister Alive   14  . Brother Alive   54  . Brother Alive   38  . Sister Deceased at age 5   blood clot  . Brother Alive   93  . Sister Deceased at age 10   blood clot   . Son Deceased   at birth     No Known Allergies  Medications: Patient's Medications  New Prescriptions   No medications on file  Previous Medications   AMBULATORY NON FORMULARY MEDICATION    1. Roho Cushion Dx: [id number] 2. Transfer Tub Bench Dx: R53.81   ASPIRIN EC 81 MG TABLET    Take 81 mg by mouth at bedtime.   ATORVASTATIN (LIPITOR) 10 MG TABLET    TAKE 1 TABLET BY MOUTH EVERY DAY   FUROSEMIDE (LASIX) 20 MG TABLET    TAKE 0.5 TABLETS (10 MG TOTAL) BY MOUTH DAILY.     LATANOPROST (XALATAN) 0.005 % OPHTHALMIC SOLUTION    PLACE 1 DROP INTO BOTH EYES AT BEDTIME.   MIRTAZAPINE (REMERON) 15 MG TABLET    Take 1 tablet (15 mg total) by mouth at bedtime.   NAMENDA XR 28 MG CP24 24 HR CAPSULE    TAKE ONE CAPSULE BY MOUTH EVERY DAY   SODIUM CHLORIDE (OCEAN) 0.65 % SOLN NASAL SPRAY    Place 1 spray into both nostrils as needed for congestion.   TRUSOPT 2 % OPHTHALMIC SOLUTION    Place 2 drops into the right eye 2 (two) times daily.   Modified Medications   No medications on file  Discontinued Medications   No medications on file    Review of Systems  Unable to perform ROS: Dementia    Vitals:   02/24/16 1137  BP: 122/68  Pulse: 71  Temp: 97.8 F (36.6 C)  TempSrc: Oral  SpO2: 96%  Weight: 91 lb (41.3 kg)  Height: 5' (1.524 m)   Body mass index is 17.77 kg/m.  Physical Exam  Constitutional: She appears well-developed.  Frail appearing in NAD.   HENT:  Mouth/Throat: Oropharynx is clear and moist. No oropharyngeal exudate.  Eyes: Pupils are equal, round, and reactive to light. No scleral icterus.  Neck: Neck supple. Carotid bruit is not present. No tracheal deviation present. No thyromegaly present.  Cardiovascular: Normal rate, regular rhythm, normal heart sounds and intact distal pulses.  Exam reveals no gallop and no friction rub.   No murmur heard. No LE edema b/l. no calf TTP.   Pulmonary/Chest: Effort normal and breath sounds normal. No stridor. No respiratory distress. She has no wheezes. She has no rales.  Abdominal: Soft. Bowel sounds are normal. She exhibits no distension and no mass. There is no hepatomegaly. There is no tenderness. There is no rebound and no guarding.  Musculoskeletal: She exhibits edema and deformity (small joints).  Lymphadenopathy:    She has no cervical adenopathy.  Neurological: She is alert.  Skin: Skin is warm and dry. No rash noted.  Psychiatric: She has a normal mood and affect. Her behavior is normal.      Labs reviewed: Appointment on 12/16/2015  Component Date Value Ref Range Status  . WBC 12/16/2015 5.2  3.8 - 10.8 K/uL Final  . RBC 12/16/2015 4.32  3.80 - 5.10 MIL/uL Final  . Hemoglobin 12/16/2015 12.5  11.7 - 15.5 g/dL Final  . HCT 28/41/3244 39.2  35.0 -  45.0 % Final  . MCV 12/16/2015 90.7  80.0 - 100.0 fL Final  . MCH 12/16/2015 28.9  27.0 - 33.0 pg Final  . MCHC 12/16/2015 31.9* 32.0 - 36.0 g/dL Final  . RDW 44/04/270 15.0  11.0 - 15.0 % Final  . Platelets 12/16/2015 164  140 - 400 K/uL Final  . MPV 12/16/2015 13.0* 7.5 - 12.5 fL Final  . Neutro Abs 12/16/2015 2132  1,500 - 7,800 cells/uL Final  . Lymphs Abs 12/16/2015 2600  850 - 3,900 cells/uL Final  . Monocytes Absolute 12/16/2015 312  200 - 950 cells/uL Final  . Eosinophils Absolute 12/16/2015 104  15 - 500 cells/uL Final  . Basophils Absolute 12/16/2015 52  0 - 200 cells/uL Final  . Neutrophils Relative % 12/16/2015 41  % Final  . Lymphocytes Relative 12/16/2015 50  % Final  . Monocytes Relative 12/16/2015 6  % Final  . Eosinophils Relative 12/16/2015 2  % Final  . Basophils Relative 12/16/2015 1  % Final  . Smear Review 12/16/2015 Criteria for review not met   Final  . Sodium 12/16/2015 142  135 - 146 mmol/L Final  . Potassium 12/16/2015 3.9  3.5 - 5.3 mmol/L Final  . Chloride 12/16/2015 111* 98 - 110 mmol/L Final  . CO2 12/16/2015 22  20 - 31 mmol/L Final  . Glucose, Bld 12/16/2015 99  65 - 99 mg/dL Final  . BUN 53/66/4403 20  7 - 25 mg/dL Final  . Creat 47/42/5956 0.94* 0.60 - 0.88 mg/dL Final   Comment:   For patients > or = 80 years of age: The upper reference limit for Creatinine is approximately 13% higher for people identified as African-American.     . Calcium 12/16/2015 8.5* 8.6 - 10.4 mg/dL Final  . GFR, Est African American 12/16/2015 64  >=60 mL/min Final  . GFR, Est Non African American 12/16/2015 56* >=60 mL/min Final    No results found.   Assessment/Plan   ICD-9-CM ICD-10-CM   1.  Alzheimer's dementia without behavioral disturbance, unspecified timing of dementia onset 331.0 G30.9    294.10 F02.80   2. Essential hypertension 401.9 I10   3. Chronic diastolic heart failure, NYHA class 1 (HCC) 428.32 I50.32   4. Mixed hyperlipidemia 272.2 E78.2   5. Failure to thrive in adult 783.7 R62.7   6. Encounter for immunization Z23 Z23 Flu Vaccine QUAD 36+ mos IM   Continue current medications as ordered  Follow up in 3 mos for routine visit.   Flu shot given today  Riyah Bardon S. Ancil Linsey  [facility] and [facility] [address] [place], [place] [address] [phone] Cell (Monday-Friday 8 AM - 5 PM) [phone] After 5 PM and follow prompts

## 2016-03-18 ENCOUNTER — Other Ambulatory Visit: Payer: Self-pay | Admitting: Internal Medicine

## 2016-03-27 ENCOUNTER — Telehealth: Payer: Self-pay | Admitting: Internal Medicine

## 2016-03-28 NOTE — Telephone Encounter (Signed)
Received [facility] [place] [phone] Perlie Mayo) Fax: [phone] Form. Meredith Staggers, [place] #[phone]. She would like to pick up forms and mail them herself when ready. Reason for the form is for 12 month recertification for long term care eligibility for chronic illness benefits thru [employer].  Policy Number: [id number] Claim Number: [id number] Forms placed in Dr. Vale Haven folder to review.

## 2016-04-04 DIAGNOSIS — Z029 Encounter for administrative examinations, unspecified: Secondary | ICD-10-CM

## 2016-04-04 NOTE — Telephone Encounter (Signed)
Dr. Eulas Post Completed Forms. Stanton Kidney, Caregiver [phone] notified to pick up. Copy sent for scanning.

## 2016-04-24 DIAGNOSIS — Z029 Encounter for administrative examinations, unspecified: Secondary | ICD-10-CM

## 2016-05-22 ENCOUNTER — Other Ambulatory Visit: Payer: Self-pay | Admitting: Internal Medicine

## 2016-05-25 ENCOUNTER — Encounter: Payer: Self-pay | Admitting: Internal Medicine

## 2016-05-25 ENCOUNTER — Ambulatory Visit (INDEPENDENT_AMBULATORY_CARE_PROVIDER_SITE_OTHER): Payer: Medicare Other | Admitting: Internal Medicine

## 2016-05-25 VITALS — BP 120/78 | HR 72 | Temp 97.3°F | Ht 60.0 in | Wt 82.4 lb

## 2016-05-25 DIAGNOSIS — E782 Mixed hyperlipidemia: Secondary | ICD-10-CM | POA: Diagnosis not present

## 2016-05-25 DIAGNOSIS — R627 Adult failure to thrive: Secondary | ICD-10-CM | POA: Diagnosis not present

## 2016-05-25 DIAGNOSIS — I5032 Chronic diastolic (congestive) heart failure: Secondary | ICD-10-CM

## 2016-05-25 DIAGNOSIS — R8290 Unspecified abnormal findings in urine: Secondary | ICD-10-CM

## 2016-05-25 DIAGNOSIS — G309 Alzheimer's disease, unspecified: Secondary | ICD-10-CM

## 2016-05-25 DIAGNOSIS — I10 Essential (primary) hypertension: Secondary | ICD-10-CM | POA: Diagnosis not present

## 2016-05-25 DIAGNOSIS — F0280 Dementia in other diseases classified elsewhere without behavioral disturbance: Secondary | ICD-10-CM | POA: Diagnosis not present

## 2016-05-25 NOTE — Patient Instructions (Signed)
Will call with lab results  Please bring back urine sample at earliest convenience  Continue current medications as ordered  Follow up in 3-4 mos for routine visit

## 2016-05-25 NOTE — Progress Notes (Signed)
Patient ID: Stacey Fernandez, female   DOB: [birth year], 83 y.o.   MRN: [id number]    Location:  [facility] Place of Service: OFFICE  Chief Complaint  Patient presents with  . Medical Management of Chronic Issues    3 month routine visit    HPI:  83 yo female seen today for f/u. Home care aid c/a strong urine smell. Pt also with change in behavior and not ambulating as well x few days. No N/V, f/c, abdominal pain.  She has no concerns. Home care aid present. She is a poor historian due to dementia. Hx obtained from chart  Dementia/anxiety/FTT/protein calorie malnutrition - takes namenda xr and remeron. No behavior disturbances. Weight 91 lbs (up from 89 lbs). She refuses ensure/boost supplements. Albumin 3.7 in May 2017. Na improved from 147-->142 with increased fluids  Hyperlipidemia - stable on lipitor. LDL 84  COPD/hx lung cancer- released from oncology last year as she has not had recurrence of tumor.   HTN/edema - stable on 1/2 tab of lasix. Also takes ASA daily  Constipation - stable. BM q3days  Glaucoma - stable on eye gtts. followed by eye specialist   Past Medical History:  Diagnosis Date  . Anxiety state, unspecified   . Arthritis    "hands, feet" (09/13/2015)  . Chronic airway obstruction, not elsewhere classified   . Chronic kidney disease (CKD), stage II (mild)    Hattie Perch 09/13/2015  . COPD (chronic obstructive pulmonary disease) (HCC)   . Dementia   . Functional constipation    Hattie Perch 09/13/2015  . Hemiplegia affecting dominant side, late effect of cerebrovascular disease   . Hyperlipidemia   . Lung cancer (HCC)   . Lung mass   . Memory loss   . Mixed Alzheimer's and vascular dementia    /notes 09/13/2015  . Other malaise and fatigue   . Protein calorie malnutrition (HCC)    Hattie Perch 09/13/2015  . Sacral decubitus ulcer    chronic; stage IV/notes 09/13/2015  . Senile dementia with delirium   . Urinary frequency     Past Surgical History:  Procedure Laterality  Date  . CATARACT EXTRACTION, BILATERAL    . mediastinal lymp node dissection  01/10/2011   VAN TRIGT  . WEDGE RESECTION RUL NODULE  01/10/2011   VAN TRIGT    Patient Care Team: Kirt Boys, DO as PCP - General (Internal Medicine) Jeri Cos, MD (Inactive) (Internal Medicine) Kerin Perna, MD (Cardiothoracic Surgery)  Social History   Social History  . Marital status: Single    Spouse name: N/A  . Number of children: N/A  . Years of education: N/A   Occupational History  . Not on file.   Social History Main Topics  . Smoking status: Former Smoker    Packs/day: 0.50    Types: Cigarettes    Quit date: 06/09/2010  . Smokeless tobacco: Never Used  . Alcohol use No  . Drug use: No  . Sexual activity: No   Other Topics Concern  . Not on file   Social History Narrative  . No narrative on file     reports that she quit smoking about 5 years ago. Her smoking use included Cigarettes. She smoked 0.50 packs per day. She has never used smokeless tobacco. She reports that she does not drink alcohol or use drugs.  Family History  Problem Relation Age of Onset  . Heart disease Mother   . Cancer Father   . Heart disease Sister   . Heart  disease Brother    Family Status  Relation Status  . Mother Deceased  . Father Deceased  . Sister Deceased  . Brother Deceased  . Sister Alive   17  . Brother Alive   60  . Sister Alive   73  . Brother Alive   25  . Brother Alive   41  . Sister Deceased at age 13   blood clot  . Brother Alive   4  . Sister Deceased at age 7   blood clot   . Son Deceased   at birth     No Known Allergies  Medications: Patient's Medications  New Prescriptions   No medications on file  Previous Medications   AMBULATORY NON FORMULARY MEDICATION    1. Roho Cushion Dx: [id number] 2. Transfer Tub Bench Dx: R53.81   ASPIRIN EC 81 MG TABLET    Take 81 mg by mouth at bedtime.   ATORVASTATIN (LIPITOR) 10 MG TABLET    TAKE 1 TABLET BY  MOUTH EVERY DAY   FUROSEMIDE (LASIX) 20 MG TABLET    TAKE 0.5 TABLETS (10 MG TOTAL) BY MOUTH DAILY.   LATANOPROST (XALATAN) 0.005 % OPHTHALMIC SOLUTION    PLACE 1 DROP INTO BOTH EYES AT BEDTIME.   MIRTAZAPINE (REMERON) 15 MG TABLET    Take 1 tablet (15 mg total) by mouth at bedtime.   NAMENDA XR 28 MG CP24 24 HR CAPSULE    TAKE ONE CAPSULE BY MOUTH EVERY DAY   SODIUM CHLORIDE (OCEAN) 0.65 % SOLN NASAL SPRAY    Place 1 spray into both nostrils as needed for congestion.   TRUSOPT 2 % OPHTHALMIC SOLUTION    Place 2 drops into the right eye 2 (two) times daily.   Modified Medications   No medications on file  Discontinued Medications   No medications on file    Review of Systems  Vitals:   05/25/16 1132  BP: 120/78  Pulse: 72  Temp: 97.3 F (36.3 C)  TempSrc: Oral  Weight: 82 lb 6.4 oz (37.4 kg)  Height: 5' (1.524 m)   Body mass index is 16.09 kg/m.  Physical Exam  Constitutional: She appears well-developed.  Frail appearing in NAD.   HENT:  Mouth/Throat: Oropharynx is clear and moist. No oropharyngeal exudate.  Eyes: Pupils are equal, round, and reactive to light. No scleral icterus.  Neck: Neck supple. Carotid bruit is not present. No tracheal deviation present. No thyromegaly present.  Cardiovascular: Normal rate, regular rhythm, normal heart sounds and intact distal pulses.  Exam reveals no gallop and no friction rub.   No murmur heard. No LE edema b/l. no calf TTP.   Pulmonary/Chest: Effort normal and breath sounds normal. No stridor. No respiratory distress. She has no wheezes. She has no rales.  Abdominal: Soft. Bowel sounds are normal. She exhibits no distension and no mass. There is no hepatomegaly. There is no tenderness. There is no rebound and no guarding.  Musculoskeletal: She exhibits edema and deformity (small joints).  Lymphadenopathy:    She has no cervical adenopathy.  Neurological: She is alert.  Skin: Skin is warm and dry. No rash noted.  Psychiatric: She  has a normal mood and affect. Her behavior is normal. She is noncommunicative.     Labs reviewed: No visits with results within 3 Month(s) from this visit.  Latest known visit with results is:  Appointment on 12/16/2015  Component Date Value Ref Range Status  . WBC 12/16/2015 5.2  3.8 - 10.8  K/uL Final  . RBC 12/16/2015 4.32  3.80 - 5.10 MIL/uL Final  . Hemoglobin 12/16/2015 12.5  11.7 - 15.5 g/dL Final  . HCT 16/01/9603 39.2  35.0 - 45.0 % Final  . MCV 12/16/2015 90.7  80.0 - 100.0 fL Final  . MCH 12/16/2015 28.9  27.0 - 33.0 pg Final  . MCHC 12/16/2015 31.9* 32.0 - 36.0 g/dL Final  . RDW 54/12/8117 15.0  11.0 - 15.0 % Final  . Platelets 12/16/2015 164  140 - 400 K/uL Final  . MPV 12/16/2015 13.0* 7.5 - 12.5 fL Final  . Neutro Abs 12/16/2015 2132  1,500 - 7,800 cells/uL Final  . Lymphs Abs 12/16/2015 2600  850 - 3,900 cells/uL Final  . Monocytes Absolute 12/16/2015 312  200 - 950 cells/uL Final  . Eosinophils Absolute 12/16/2015 104  15 - 500 cells/uL Final  . Basophils Absolute 12/16/2015 52  0 - 200 cells/uL Final  . Neutrophils Relative % 12/16/2015 41  % Final  . Lymphocytes Relative 12/16/2015 50  % Final  . Monocytes Relative 12/16/2015 6  % Final  . Eosinophils Relative 12/16/2015 2  % Final  . Basophils Relative 12/16/2015 1  % Final  . Smear Review 12/16/2015 Criteria for review not met   Final  . Sodium 12/16/2015 142  135 - 146 mmol/L Final  . Potassium 12/16/2015 3.9  3.5 - 5.3 mmol/L Final  . Chloride 12/16/2015 111* 98 - 110 mmol/L Final  . CO2 12/16/2015 22  20 - 31 mmol/L Final  . Glucose, Bld 12/16/2015 99  65 - 99 mg/dL Final  . BUN 14/78/2956 20  7 - 25 mg/dL Final  . Creat 21/30/8657 0.94* 0.60 - 0.88 mg/dL Final   Comment:   For patients > or = 82 years of age: The upper reference limit for Creatinine is approximately 13% higher for people identified as African-American.     . Calcium 12/16/2015 8.5* 8.6 - 10.4 mg/dL Final  . GFR, Est African American  12/16/2015 64  >=60 mL/min Final  . GFR, Est Non African American 12/16/2015 56* >=60 mL/min Final    No results found.   Assessment/Plan   ICD-9-CM ICD-10-CM   1. Abnormal urine odor 791.9 R82.90 Urinalysis with Reflex Microscopic  2. Failure to thrive in adult 783.7 R62.7   3. Mixed hyperlipidemia 272.2 E78.2 Lipid Panel  4. Chronic diastolic heart failure, NYHA class 1 (HCC) 428.32 I50.32 CMP with eGFR  5. Alzheimer's dementia without behavioral disturbance, unspecified timing of dementia onset 331.0 G30.9 CMP with eGFR   294.10 F02.80   6. Essential hypertension 401.9 I10     Will call with lab results  Please bring back urine sample at earliest convenience  Continue current medications as ordered  Follow up in 3-4 mos for routine visit   Anik Wesch S. Ancil Linsey  [facility] [address] [place], [place] [address] [phone] Cell (Monday-Friday 8 AM - 5 PM) [phone] After 5 PM and follow prompts

## 2016-05-26 LAB — COMPLETE METABOLIC PANEL WITH GFR
ALT: 47 U/L — ABNORMAL HIGH (ref 6–29)
AST: 45 U/L — ABNORMAL HIGH (ref 10–35)
Albumin: 4 g/dL (ref 3.6–5.1)
Alkaline Phosphatase: 85 U/L (ref 33–130)
BUN: 53 mg/dL — ABNORMAL HIGH (ref 7–25)
CHLORIDE: 128 mmol/L — AB (ref 98–110)
CO2: 25 mmol/L (ref 20–31)
Calcium: 9 mg/dL (ref 8.6–10.4)
Creat: 1.17 mg/dL — ABNORMAL HIGH (ref 0.60–0.88)
GFR, EST AFRICAN AMERICAN: 49 mL/min — AB (ref >=60)
GFR, EST NON AFRICAN AMERICAN: 43 mL/min — AB (ref >=60)
Glucose, Bld: 92 mg/dL (ref 65–99)
POTASSIUM: 3.7 mmol/L (ref 3.5–5.3)
Sodium: 165 mmol/L (ref 135–146)
Total Bilirubin: 0.5 mg/dL (ref 0.2–1.2)
Total Protein: 7.5 g/dL (ref 6.1–8.1)

## 2016-05-26 LAB — LIPID PANEL
Cholesterol: 156 mg/dL (ref <200)
HDL: 53 mg/dL (ref >50)
LDL CALC: 82 mg/dL (ref <100)
TRIGLYCERIDES: 104 mg/dL (ref <150)
Total CHOL/HDL Ratio: 2.9 Ratio (ref <5.0)
VLDL: 21 mg/dL (ref <30)

## 2016-05-28 ENCOUNTER — Emergency Department (HOSPITAL_COMMUNITY): Payer: Medicare Other

## 2016-05-28 ENCOUNTER — Encounter (HOSPITAL_COMMUNITY): Payer: Self-pay | Admitting: Emergency Medicine

## 2016-05-28 ENCOUNTER — Inpatient Hospital Stay (HOSPITAL_COMMUNITY)
Admission: EM | Admit: 2016-05-28 | Discharge: 2016-06-01 | DRG: 682 | Disposition: A | Discharge status: Home Health | Payer: Medicare Other | Attending: Nephrology | Admitting: Nephrology

## 2016-05-28 ENCOUNTER — Telehealth: Payer: Self-pay

## 2016-05-28 DIAGNOSIS — I10 Essential (primary) hypertension: Secondary | ICD-10-CM

## 2016-05-28 DIAGNOSIS — G309 Alzheimer's disease, unspecified: Secondary | ICD-10-CM | POA: Diagnosis present

## 2016-05-28 DIAGNOSIS — Z7982 Long term (current) use of aspirin: Secondary | ICD-10-CM

## 2016-05-28 DIAGNOSIS — Z7189 Other specified counseling: Secondary | ICD-10-CM | POA: Diagnosis not present

## 2016-05-28 DIAGNOSIS — I130 Hypertensive heart and chronic kidney disease with heart failure and stage 1 through stage 4 chronic kidney disease, or unspecified chronic kidney disease: Secondary | ICD-10-CM | POA: Diagnosis present

## 2016-05-28 DIAGNOSIS — D72829 Elevated white blood cell count, unspecified: Secondary | ICD-10-CM

## 2016-05-28 DIAGNOSIS — R4182 Altered mental status, unspecified: Secondary | ICD-10-CM | POA: Diagnosis not present

## 2016-05-28 DIAGNOSIS — E43 Unspecified severe protein-calorie malnutrition: Secondary | ICD-10-CM | POA: Diagnosis present

## 2016-05-28 DIAGNOSIS — R740 Nonspecific elevation of levels of transaminase and lactic acid dehydrogenase [LDH]: Secondary | ICD-10-CM | POA: Diagnosis present

## 2016-05-28 DIAGNOSIS — I340 Nonrheumatic mitral (valve) insufficiency: Secondary | ICD-10-CM | POA: Diagnosis present

## 2016-05-28 DIAGNOSIS — E860 Dehydration: Secondary | ICD-10-CM | POA: Diagnosis present

## 2016-05-28 DIAGNOSIS — Z515 Encounter for palliative care: Secondary | ICD-10-CM

## 2016-05-28 DIAGNOSIS — R7989 Other specified abnormal findings of blood chemistry: Secondary | ICD-10-CM | POA: Diagnosis present

## 2016-05-28 DIAGNOSIS — E440 Moderate protein-calorie malnutrition: Secondary | ICD-10-CM | POA: Diagnosis not present

## 2016-05-28 DIAGNOSIS — Z9841 Cataract extraction status, right eye: Secondary | ICD-10-CM

## 2016-05-28 DIAGNOSIS — Z681 Body mass index (BMI) 19 or less, adult: Secondary | ICD-10-CM

## 2016-05-28 DIAGNOSIS — E785 Hyperlipidemia, unspecified: Secondary | ICD-10-CM | POA: Diagnosis present

## 2016-05-28 DIAGNOSIS — Z66 Do not resuscitate: Secondary | ICD-10-CM | POA: Diagnosis present

## 2016-05-28 DIAGNOSIS — N182 Chronic kidney disease, stage 2 (mild): Secondary | ICD-10-CM

## 2016-05-28 DIAGNOSIS — G9341 Metabolic encephalopathy: Secondary | ICD-10-CM | POA: Diagnosis present

## 2016-05-28 DIAGNOSIS — N179 Acute kidney failure, unspecified: Secondary | ICD-10-CM | POA: Diagnosis present

## 2016-05-28 DIAGNOSIS — R64 Cachexia: Secondary | ICD-10-CM | POA: Diagnosis present

## 2016-05-28 DIAGNOSIS — Z809 Family history of malignant neoplasm, unspecified: Secondary | ICD-10-CM

## 2016-05-28 DIAGNOSIS — N183 Chronic kidney disease, stage 3 unspecified: Secondary | ICD-10-CM | POA: Diagnosis present

## 2016-05-28 DIAGNOSIS — I5032 Chronic diastolic (congestive) heart failure: Secondary | ICD-10-CM | POA: Diagnosis present

## 2016-05-28 DIAGNOSIS — E870 Hyperosmolality and hypernatremia: Secondary | ICD-10-CM | POA: Diagnosis present

## 2016-05-28 DIAGNOSIS — D696 Thrombocytopenia, unspecified: Secondary | ICD-10-CM | POA: Diagnosis present

## 2016-05-28 DIAGNOSIS — R1310 Dysphagia, unspecified: Secondary | ICD-10-CM | POA: Diagnosis present

## 2016-05-28 DIAGNOSIS — M19042 Primary osteoarthritis, left hand: Secondary | ICD-10-CM | POA: Diagnosis present

## 2016-05-28 DIAGNOSIS — K5904 Chronic idiopathic constipation: Secondary | ICD-10-CM | POA: Diagnosis present

## 2016-05-28 DIAGNOSIS — F0150 Vascular dementia without behavioral disturbance: Secondary | ICD-10-CM | POA: Diagnosis present

## 2016-05-28 DIAGNOSIS — L89154 Pressure ulcer of sacral region, stage 4: Secondary | ICD-10-CM

## 2016-05-28 DIAGNOSIS — Z85118 Personal history of other malignant neoplasm of bronchus and lung: Secondary | ICD-10-CM

## 2016-05-28 DIAGNOSIS — N3000 Acute cystitis without hematuria: Secondary | ICD-10-CM | POA: Diagnosis present

## 2016-05-28 DIAGNOSIS — R627 Adult failure to thrive: Secondary | ICD-10-CM | POA: Diagnosis present

## 2016-05-28 DIAGNOSIS — Z87891 Personal history of nicotine dependence: Secondary | ICD-10-CM | POA: Diagnosis not present

## 2016-05-28 DIAGNOSIS — L89152 Pressure ulcer of sacral region, stage 2: Secondary | ICD-10-CM | POA: Diagnosis present

## 2016-05-28 DIAGNOSIS — M19071 Primary osteoarthritis, right ankle and foot: Secondary | ICD-10-CM | POA: Diagnosis present

## 2016-05-28 DIAGNOSIS — M19041 Primary osteoarthritis, right hand: Secondary | ICD-10-CM | POA: Diagnosis present

## 2016-05-28 DIAGNOSIS — M19072 Primary osteoarthritis, left ankle and foot: Secondary | ICD-10-CM | POA: Diagnosis present

## 2016-05-28 DIAGNOSIS — F0280 Dementia in other diseases classified elsewhere without behavioral disturbance: Secondary | ICD-10-CM | POA: Diagnosis present

## 2016-05-28 DIAGNOSIS — Z9842 Cataract extraction status, left eye: Secondary | ICD-10-CM

## 2016-05-28 DIAGNOSIS — Z8249 Family history of ischemic heart disease and other diseases of the circulatory system: Secondary | ICD-10-CM | POA: Diagnosis not present

## 2016-05-28 DIAGNOSIS — R531 Weakness: Secondary | ICD-10-CM | POA: Diagnosis present

## 2016-05-28 LAB — COMPREHENSIVE METABOLIC PANEL
ALT: 66 U/L — ABNORMAL HIGH (ref 14–54)
AST: 74 U/L — AB (ref 15–41)
Albumin: 3.7 g/dL (ref 3.5–5.0)
Alkaline Phosphatase: 78 U/L (ref 38–126)
BUN: 59 mg/dL — AB (ref 6–20)
CO2: 25 mmol/L (ref 22–32)
Calcium: 9.3 mg/dL (ref 8.9–10.3)
Creatinine, Ser: 1.51 mg/dL — ABNORMAL HIGH (ref 0.44–1.00)
GFR, EST AFRICAN AMERICAN: 35 mL/min — AB (ref >60)
GFR, EST NON AFRICAN AMERICAN: 30 mL/min — AB (ref >60)
Glucose, Bld: 93 mg/dL (ref 65–99)
POTASSIUM: 3.8 mmol/L (ref 3.5–5.1)
Sodium: 167 mmol/L (ref 135–145)
Total Bilirubin: 0.7 mg/dL (ref 0.3–1.2)
Total Protein: 7.2 g/dL (ref 6.5–8.1)

## 2016-05-28 LAB — BASIC METABOLIC PANEL
BUN: 54 mg/dL — AB (ref 6–20)
CO2: 24 mmol/L (ref 22–32)
CREATININE: 1.42 mg/dL — AB (ref 0.44–1.00)
Calcium: 8.7 mg/dL — ABNORMAL LOW (ref 8.9–10.3)
GFR calc Af Amer: 38 mL/min — ABNORMAL LOW (ref >60)
GFR calc non Af Amer: 33 mL/min — ABNORMAL LOW (ref >60)
GLUCOSE: 124 mg/dL — AB (ref 65–99)
Potassium: 3.9 mmol/L (ref 3.5–5.1)
SODIUM: 162 mmol/L — AB (ref 135–145)

## 2016-05-28 LAB — CBC
HEMATOCRIT: 46.9 % — AB (ref 36.0–46.0)
HEMOGLOBIN: 14.2 g/dL (ref 12.0–15.0)
MCH: 29.6 pg (ref 26.0–34.0)
MCHC: 30.3 g/dL (ref 30.0–36.0)
MCV: 97.9 fL (ref 78.0–100.0)
Platelets: 132 10*3/uL — ABNORMAL LOW (ref 150–400)
RBC: 4.79 MIL/uL (ref 3.87–5.11)
RDW: 15.7 % — ABNORMAL HIGH (ref 11.5–15.5)
WBC: 8.9 10*3/uL (ref 4.0–10.5)

## 2016-05-28 LAB — URINALYSIS, ROUTINE W REFLEX MICROSCOPIC
BILIRUBIN URINE: NEGATIVE
Glucose, UA: NEGATIVE mg/dL
KETONES UR: NEGATIVE mg/dL
LEUKOCYTES UA: NEGATIVE
Nitrite: NEGATIVE
PH: 6 (ref 5.0–8.0)
Protein, ur: NEGATIVE mg/dL
Specific Gravity, Urine: 1.011 (ref 1.005–1.030)
Squamous Epithelial / LPF: NONE SEEN

## 2016-05-28 LAB — I-STAT CG4 LACTIC ACID, ED
Lactic Acid, Venous: 2.05 mmol/L (ref 0.5–1.9)
Lactic Acid, Venous: 2.11 mmol/L (ref 0.5–1.9)

## 2016-05-28 MED ORDER — SODIUM CHLORIDE 0.9 % IV BOLUS (SEPSIS)
500.0000 mL | Freq: Once | INTRAVENOUS | Status: AC
  Administered 2016-05-28: 500 mL via INTRAVENOUS

## 2016-05-28 MED ORDER — SALINE SPRAY 0.65 % NA SOLN
1.0000 | NASAL | Status: DC | PRN
  Filled 2016-05-28: qty 44

## 2016-05-28 MED ORDER — HEPARIN SODIUM (PORCINE) 5000 UNIT/ML IJ SOLN
5000.0000 [IU] | Freq: Three times a day (TID) | INTRAMUSCULAR | Status: DC
  Administered 2016-05-28 – 2016-05-31 (×7): 5000 [IU] via SUBCUTANEOUS
  Filled 2016-05-28 (×7): qty 1

## 2016-05-28 MED ORDER — ACETAMINOPHEN 325 MG PO TABS: 650.0000 mg | ORAL_TABLET | Freq: Four times a day (QID) | ORAL | Status: DC | PRN

## 2016-05-28 MED ORDER — ONDANSETRON HCL 4 MG PO TABS: 4.0000 mg | ORAL_TABLET | Freq: Four times a day (QID) | ORAL | Status: DC | PRN

## 2016-05-28 MED ORDER — ACETAMINOPHEN 650 MG RE SUPP: 650.0000 mg | Freq: Four times a day (QID) | RECTAL | Status: DC | PRN

## 2016-05-28 MED ORDER — DEXTROSE 5 % IV SOLN
INTRAVENOUS | Status: DC
  Administered 2016-05-28: 13:00:00 via INTRAVENOUS

## 2016-05-28 MED ORDER — ENOXAPARIN SODIUM 40 MG/0.4ML ~~LOC~~ SOLN: 40.0000 mg | SUBCUTANEOUS | Status: DC

## 2016-05-28 MED ORDER — SODIUM CHLORIDE 0.9 % IV SOLN: INTRAVENOUS | Status: DC

## 2016-05-28 MED ORDER — ENOXAPARIN SODIUM 30 MG/0.3ML ~~LOC~~ SOLN
20.0000 mg | SUBCUTANEOUS | Status: DC
  Filled 2016-05-28: qty 0.3
  Filled 2016-05-28: qty 0.2

## 2016-05-28 MED ORDER — ONDANSETRON HCL 4 MG/2ML IJ SOLN: 4.0000 mg | Freq: Four times a day (QID) | INTRAMUSCULAR | Status: DC | PRN

## 2016-05-28 MED ORDER — LATANOPROST 0.005 % OP SOLN
1.0000 [drp] | Freq: Every day | OPHTHALMIC | Status: DC
  Administered 2016-05-28 – 2016-05-31 (×3): 1 [drp] via OPHTHALMIC
  Filled 2016-05-28: qty 2.5

## 2016-05-28 MED ORDER — DORZOLAMIDE HCL 2 % OP SOLN
2.0000 [drp] | Freq: Two times a day (BID) | OPHTHALMIC | Status: DC
  Administered 2016-05-28 – 2016-05-31 (×6): 2 [drp] via OPHTHALMIC
  Filled 2016-05-28: qty 10

## 2016-05-28 NOTE — Progress Notes (Signed)
Unable to complete admission at this time d./t patient confused and no family at bedside.

## 2016-05-28 NOTE — Care Management Note (Signed)
Case Management Note  Patient Details  Name: Stacey Fernandez MRN: [id number] Date of Birth: 22-Jun-1929  Subjective/Objective:                 Patient admitted from Private residence/24 hour per day caregivers with metabolic encephalopathy and hypernatremia 2/2 poor intake. Slow IVF correction for Na 167. Pt is approaching end of life. Palliative care consult for GOC/Hospice. CM will continue to follow.    Action/Plan:   Expected Discharge Date:                  Expected Discharge Plan:     In-House Referral:     Discharge planning Services  CM Consult  Post Acute Care Choice:    Choice offered to:     DME Arranged:    DME Agency:     HH Arranged:    HH Agency:     Status of Service:  In process, will continue to follow  If discussed at Long Length of Stay Meetings, dates discussed:    Additional Comments:  Carles Collet, RN 05/28/2016, 3:51 PM

## 2016-05-28 NOTE — ED Provider Notes (Signed)
[facility] DEPT Provider Note   CSN: [id number] Arrival date & time: 05/28/16  4098     History   Chief Complaint Chief Complaint  Patient presents with  . Altered Mental Status    HPI Stacey Fernandez is a 83 y.o. female. Patient is an 83 year old female with history as below who presents with her niece due to lethargy and foul-smelling urine. Patient was seen by PCP on Friday. The niece reports that they received a call this morning stating that she needed to be taken to the emergency department immediately. They were told that she is dehydrated.  The niece reports the patient has been more weak and less interactive over the last few days. She also reports darker and more foul-smelling urine. The patient has had reduced by mouth intake since yesterday. She has had no fever or chills. No cough. No nausea or vomiting. At baseline, patient has dementia but is typically able to indicate if she is in any discomfort. At baseline, she walks with assistance.  HPI  Past Medical History:  Diagnosis Date  . Anxiety state, unspecified   . Arthritis    "hands, feet" (09/13/2015)  . Chronic airway obstruction, not elsewhere classified   . Chronic kidney disease (CKD), stage II (mild)    [name] 09/13/2015  . COPD (chronic obstructive pulmonary disease) ([place])   . Dementia   . Functional constipation    [name] 09/13/2015  . Hemiplegia affecting dominant side, late effect of cerebrovascular disease   . Hyperlipidemia   . Lung cancer ([place])   . Lung mass   . Memory loss   . Mixed Alzheimer's and vascular dementia    /notes 09/13/2015  . Other malaise and fatigue   . Protein calorie malnutrition ([place])    [name] 09/13/2015  . Sacral decubitus ulcer    chronic; stage IV/notes 09/13/2015  . Senile dementia with delirium   . Urinary frequency     Patient Active Problem List   Diagnosis Date Noted  . Palliative care encounter   . Protein-calorie malnutrition, severe 09/14/2015  . CKD  (chronic kidney disease), stage II 09/13/2015  . AKI (acute kidney injury) ([place]) 09/13/2015  . Dehydration with hypernatremia 09/13/2015  . Metabolic encephalopathy 11/91/4782  . Chronic diastolic heart failure, NYHA class 1 ([place]) 09/13/2015  . Diarrhea 09/13/2015  . Altered mental status   . Decubitus ulcer of sacral region, stage 4 ([place]) 09/06/2015  . Mixed Alzheimer's and vascular dementia 12/01/2013  . Essential hypertension 12/01/2013  . Nail hypertrophy 12/01/2013  . Protein-calorie malnutrition, moderate ([place]) 09/08/2013  . Functional constipation 09/08/2013  . Edema 07/28/2013  . Allergic rhinitis 07/28/2013  . Dyspnea on exertion 02/04/2013  . Impaired renal function 09/23/2012  . Urinary frequency 07/29/2012  . Malignant neoplasm of bronchus and lung, unspecified site 07/29/2012  . Anxiety state, unspecified 07/29/2012  . Primary pulmonary hypertension ([place]) 07/29/2012  . Chronic airway obstruction, not elsewhere classified 07/29/2012  . Other malaise and fatigue 07/29/2012  . Hyperlipidemia     Past Surgical History:  Procedure Laterality Date  . CATARACT EXTRACTION, BILATERAL    . mediastinal lymp node dissection  01/10/2011   VAN TRIGT  . WEDGE RESECTION RUL NODULE  01/10/2011   VAN TRIGT    OB History    No data available       Home Medications    Prior to Admission medications   Medication Sig Start Date End Date Taking? Authorizing Provider  aspirin EC 81 MG tablet Take  81 mg by mouth at bedtime.   Yes Historical Provider, MD  atorvastatin (LIPITOR) 10 MG tablet TAKE 1 TABLET BY MOUTH EVERY DAY 05/22/16  Yes Gildardo Cranker, DO  furosemide (LASIX) 20 MG tablet TAKE 0.5 TABLETS (10 MG TOTAL) BY MOUTH DAILY. 03/19/16  Yes Monica Eulas Post, DO  latanoprost (XALATAN) 0.005 % ophthalmic solution PLACE 1 DROP INTO BOTH EYES AT BEDTIME. 02/15/14  Yes Tiffany L Reed, DO  mirtazapine (REMERON) 15 MG tablet Take 1 tablet (15 mg total) by mouth at bedtime. 11/23/15  Yes  Monica Carter, DO  NAMENDA XR 28 MG CP24 24 hr capsule TAKE ONE CAPSULE BY MOUTH EVERY DAY 02/13/16  Yes Gildardo Cranker, DO  sodium chloride (OCEAN) 0.65 % SOLN nasal spray Place 1 spray into both nostrils as needed for congestion. 07/28/13  Yes Mahima Pandey, MD  TRUSOPT 2 % ophthalmic solution Place 2 drops into the right eye 2 (two) times daily.  08/20/14  Yes Historical Provider, MD  AMBULATORY NON FORMULARY MEDICATION 1. Roho Cushion Dx: [id number] 2. Transfer Tub Bench Dx: O27.03 10/11/15   Gildardo Cranker, DO    Family History Family History  Problem Relation Age of Onset  . Heart disease Mother   . Cancer Father   . Heart disease Sister   . Heart disease Brother     Social History Social History  Substance Use Topics  . Smoking status: Former Smoker    Packs/day: 0.50    Types: Cigarettes    Quit date: 06/09/2010  . Smokeless tobacco: Never Used  . Alcohol use No     Allergies   Patient has no known allergies.   Review of Systems Review of Systems  Unable to perform ROS: Dementia     Physical Exam Updated Vital Signs BP (!) 143/70 (BP Location: Right Arm)   Pulse 78   Temp 97.1 F (36.2 C) (Oral)   Resp 10   Ht 5' (1.524 m)   Wt 36.4 kg   SpO2 100%   BMI 15.68 kg/m   Physical Exam  Constitutional: She appears well-developed. No distress.  Pt appears malnourished  HENT:  Head: Normocephalic and atraumatic.  Eyes: Conjunctivae are normal.  Neck: Neck supple.  Cardiovascular: Normal rate and regular rhythm.   No murmur heard. Pulmonary/Chest: Effort normal and breath sounds normal. No respiratory distress.  Abdominal: Soft. There is no tenderness.  Musculoskeletal: She exhibits no edema.  Neurological: She is alert.  Answers "yes" when asked her name. She does not reliably follow commands but is spontaneously moving all extremities.  Skin: Skin is warm and dry.  Psychiatric: She has a normal mood and affect.  Nursing note and vitals reviewed.    ED  Treatments / Results  Labs (all labs ordered are listed, but only abnormal results are displayed) Labs Reviewed  COMPREHENSIVE METABOLIC PANEL - Abnormal; Notable for the following:       Result Value   Sodium 167 (*)    Chloride >130 (*)    BUN 59 (*)    Creatinine, Ser 1.51 (*)    AST 74 (*)    ALT 66 (*)    GFR calc non Af Amer 30 (*)    GFR calc Af Amer 35 (*)    All other components within normal limits  CBC - Abnormal; Notable for the following:    HCT 46.9 (*)    RDW 15.7 (*)    Platelets 132 (*)    All other components within normal limits  I-STAT CG4 LACTIC ACID, ED - Abnormal; Notable for the following:    Lactic Acid, Venous 2.05 (*)    All other components within normal limits  I-STAT CG4 LACTIC ACID, ED - Abnormal; Notable for the following:    Lactic Acid, Venous 2.11 (*)    All other components within normal limits  URINE CULTURE  URINALYSIS, ROUTINE W REFLEX MICROSCOPIC  BASIC METABOLIC PANEL    EKG  EKG Interpretation None       Radiology Dg Chest 2 View  Result Date: 05/28/2016 CLINICAL DATA:  History of cancer, COPD, dementia. EXAM: CHEST  2 VIEW COMPARISON:  09/13/2015 FINDINGS: The heart is enlarged. Postoperative changes are identified in the right lung apex. There are no focal consolidations, pleural effusions, or pulmonary edema. Perihilar peribronchial thickening is present. IMPRESSION: Stable cardiomegaly.  Bronchitic changes. Electronically Signed   By: Nolon Nations M.D.   On: 05/28/2016 14:01   Ct Head Wo Contrast  Result Date: 05/28/2016 CLINICAL DATA:  Altered mental status, lethargy, not drinking well, odor to urine, history COPD, lung cancer, Alzheimer's EXAM: CT HEAD WITHOUT CONTRAST TECHNIQUE: Contiguous axial images were obtained from the base of the skull through the vertex without intravenous contrast. Sagittal and coronal MPR images reconstructed from axial data set. COMPARISON:  12/08/2015 FINDINGS: Brain: Generalized atrophy.  Normal ventricular morphology. No midline shift or mass effect. Small vessel chronic ischemic changes of deep cerebral white matter. No intracranial hemorrhage, mass lesion, evidence of acute infarction, or extra-axial fluid collection. Vascular: Atherosclerotic calcifications of internal carotid and vertebral arteries at skullbase Skull: Intact Sinuses/Orbits: Clear Other: N/A IMPRESSION: Atrophy with small vessel chronic ischemic changes of deep cerebral white matter. No acute intracranial abnormalities. Electronically Signed   By: Lavonia Dana M.D.   On: 05/28/2016 12:08    Procedures Procedures (including critical care time)  Medications Ordered in ED Medications  dextrose 5 % solution ( Intravenous Transfusing/Transfer 05/28/16 1315)  dorzolamide (TRUSOPT) 2 % ophthalmic solution 2 drop (not administered)  latanoprost (XALATAN) 0.005 % ophthalmic solution 1 drop (not administered)  sodium chloride (OCEAN) 0.65 % nasal spray 1 spray (not administered)  acetaminophen (TYLENOL) tablet 650 mg (not administered)    Or  acetaminophen (TYLENOL) suppository 650 mg (not administered)  ondansetron (ZOFRAN) tablet 4 mg (not administered)    Or  ondansetron (ZOFRAN) injection 4 mg (not administered)  enoxaparin (LOVENOX) injection 20 mg (not administered)  sodium chloride 0.9 % bolus 500 mL (0 mLs Intravenous Stopped 05/28/16 1154)     Initial Impression / Assessment and Plan / ED Course  I have reviewed the triage vital signs and the nursing notes.  Pertinent labs & imaging results that were available during my care of the patient were reviewed by me and considered in my medical decision making (see chart for details).     Patient is a 83 year old female with history as above who presents with her family members due to hypernatremia identified on outpatient lab testing. Patient was seen by PCP on Friday for routine visit. They were called to come to the ED due to sodium of 165. Family reports  the patient has been more somnolent, eating less, and with foul-smelling urine over the last 2 days. Her overall orientation is at baseline, however she has been even less able to talk over the last couple of days. Exam is limited due to the patient's condition. She is unable to fully participate neuro exam. CT head unremarkable for acute changes. Sodium here is 167.  Patient will be admitted for further workup and treatment of her hypernatremia. BUN and creatinine appear consistent with prerenal azotemia. She was given 500 mL normal saline here, will rehydrate slowly to avoid rapid correction of her hypernatremia.  Final Clinical Impressions(s) / ED Diagnoses   Final diagnoses:  Altered mental status, unspecified altered mental status type  Acute hypernatremia    New Prescriptions Current Discharge Medication List       Clifton James, MD 05/28/16 [place], MD 05/28/16 [phone]

## 2016-05-28 NOTE — ED Notes (Signed)
Patient transported to CT 

## 2016-05-28 NOTE — Progress Notes (Signed)
In and out cath per MD order.  700 cc cloudy, yellow, odorous urine return. Patient tolerated well.

## 2016-05-28 NOTE — Telephone Encounter (Signed)
[facility] called with a critical sodium level of 165 for patient. Lab tech stated that sodium level was rechecked for verification and it remained critical. I called Dr. Eulas Post and left her a voicemail detailing the lab call.

## 2016-05-28 NOTE — Progress Notes (Signed)
CRITICAL VALUE ALERT  Critical value received:  Na+ 167 & Chloride >130  Date of notification:  05/28/16  Time of notification:  15:31  Critical value read back: yes  Nurse who received alert:  Cassell Smiles   MD notified (1st page):  Dr. Marily Memos  Time of first page:  15:34  MD notified (2nd page):   Time of second page:  Responding MD:  Dr. Marily Memos  Time MD responded:  15:35

## 2016-05-28 NOTE — ED Notes (Signed)
Returned from ct and placed back on monitor

## 2016-05-28 NOTE — H&P (Signed)
History and Physical    Stacey Fernandez YQM:[id number] DOB: 1930/02/13 DOA: 05/28/2016   PCP: Kirt Boys, DO   Patient coming from/Resides with: Private residence/24 hour per day caregivers  Admission status: Inpatient/medical floor -medically necessary to stay a minimum 2 midnights to rule out impending and/or unexpected changes in physiologic status that may differ from initial evaluation performed in the ER and/or at time of admission. Presents with acute metabolic encephalopathy in setting of hyponatremia with comorbidity of severe dementia. Patient will require continuous IV fluids for correction of sodium, daily labs and monitoring of intake and output. Unclear of concomitant UTI with collection of urinalysis pending. Potential for goals of care discussion with family noting already DO NOT RESUSCITATE.  Chief Complaint: Altered mental status with known hypernatremia  HPI: Stacey Fernandez is a 83 y.o. female with medical history significant for severe end-stage dementia with associated aorta throbbing severe protein Malnutrition, hypertension, chronic diastolic heart failure, chronic kidney disease, dyslipidemia and functional constipation. She was evaluated at her PCP office on 2/9 after home care assistant noticed strong urine smell with change in mentation and patient unable to ambulate as per baseline. Labs were obtained and plan was for patient to provide a urine sample. By this morning lab results demonstrated a sodium of 165 and patient's brother was notified and patient was subsequently transported to the hospital by EMS.  Discussion with the patient's home health aide at baseline patient is nonconversant nonverbal and able to ambulate with a walker and with assistance of HH aides. The past 1 week she has been less alert than baseline. Family not at bedside at time of admission.  ED Course:  Vital Signs: BP 130/84   Pulse 82   Temp 97.3 F (36.3 C) (Oral)   Resp 12    SpO2 99%  2 view chest x-ray: Ordered but not resulted-I have reviewed the films and chest x-ray is clear any acute cardiopulmonary process CT head without contrast: Atrophy with small vessel chronic ischemic changes of deep cerebral white matter without any acute intracranial abnormalities Lab data: Sodium 167, potassium 3.8, chloride greater than 1:30, CO2 25, glucose 93, BUN 59, creatinine 1.5, AST 74, ALT 66, lactic acid 2.5, white count 8900 differential not obtained, hemoglobin 14.2, platelets 132,000 Medications and treatments: Normal saline bolus 500 mL 1  Review of Systems:  In addition to the HPI above,  No Fever-chills, myalgias or other constitutional symptoms No Headache, changes with Vision or hearing, new weakness, tingling, numbness in any extremity, dizziness, dysarthria or word finding difficulty, tremors or seizure activity No problems swallowing food or Liquids, indigestion/reflux, choking or coughing while eating, abdominal pain with or after eating No Chest pain, Cough or Shortness of Breath, palpitations, orthopnea or DOE No Abdominal pain, N/V, melena,hematochezia, dark tarry stools, constipation No dysuria, malodorous urine, hematuria or flank pain No new skin rashes, lesions, masses or bruises, No new joint pains, aches, swelling or redness No recent unintentional weight gain or loss No polyuria, polydypsia or polyphagia   Past Medical History:  Diagnosis Date  . Anxiety state, unspecified   . Arthritis    "hands, feet" (09/13/2015)  . Chronic airway obstruction, not elsewhere classified   . Chronic kidney disease (CKD), stage II (mild)    Hattie Perch 09/13/2015  . COPD (chronic obstructive pulmonary disease) (HCC)   . Dementia   . Functional constipation    Hattie Perch 09/13/2015  . Hemiplegia affecting dominant side, late effect of cerebrovascular disease   . Hyperlipidemia   .  Lung cancer (HCC)   . Lung mass   . Memory loss   . Mixed Alzheimer's and vascular  dementia    /notes 09/13/2015  . Other malaise and fatigue   . Protein calorie malnutrition (HCC)    Hattie Perch 09/13/2015  . Sacral decubitus ulcer    chronic; stage IV/notes 09/13/2015  . Senile dementia with delirium   . Urinary frequency     Past Surgical History:  Procedure Laterality Date  . CATARACT EXTRACTION, BILATERAL    . mediastinal lymp node dissection  01/10/2011   VAN TRIGT  . WEDGE RESECTION RUL NODULE  01/10/2011   VAN TRIGT    Social History   Social History  . Marital status: Single    Spouse name: N/A  . Number of children: N/A  . Years of education: N/A   Occupational History  . Not on file.   Social History Main Topics  . Smoking status: Former Smoker    Packs/day: 0.50    Types: Cigarettes    Quit date: 06/09/2010  . Smokeless tobacco: Never Used  . Alcohol use No  . Drug use: No  . Sexual activity: No   Other Topics Concern  . Not on file   Social History Narrative  . No narrative on file    Mobility: 2+ assist with rolling walker Work history: Not obtained   No Known Allergies  Family History  Problem Relation Age of Onset  . Heart disease Mother   . Cancer Father   . Heart disease Sister   . Heart disease Brother      Prior to Admission medications   Medication Sig Start Date End Date Taking? Authorizing Provider  AMBULATORY NON FORMULARY MEDICATION 1. Roho Cushion Dx: [id number] 2. Transfer Tub Bench Dx: R53.81 10/11/15   Kirt Boys, DO  aspirin EC 81 MG tablet Take 81 mg by mouth at bedtime.    Historical Provider, MD  atorvastatin (LIPITOR) 10 MG tablet TAKE 1 TABLET BY MOUTH EVERY DAY 05/22/16   Kirt Boys, DO  furosemide (LASIX) 20 MG tablet TAKE 0.5 TABLETS (10 MG TOTAL) BY MOUTH DAILY. 03/19/16   Monica Montez Morita, DO  latanoprost (XALATAN) 0.005 % ophthalmic solution PLACE 1 DROP INTO BOTH EYES AT BEDTIME. 02/15/14   Tiffany L Reed, DO  mirtazapine (REMERON) 15 MG tablet Take 1 tablet (15 mg total) by mouth at bedtime.  11/23/15   Monica Carter, DO  NAMENDA XR 28 MG CP24 24 hr capsule TAKE ONE CAPSULE BY MOUTH EVERY DAY 02/13/16   Kirt Boys, DO  sodium chloride (OCEAN) 0.65 % SOLN nasal spray Place 1 spray into both nostrils as needed for congestion. 07/28/13   Oneal Grout, MD  TRUSOPT 2 % ophthalmic solution Place 2 drops into the right eye 2 (two) times daily.  08/20/14   Historical Provider, MD    Physical Exam: Vitals:   05/28/16 0959 05/28/16 1115  BP: 117/90 130/84  Pulse: 89 82  Resp: 18 12  Temp: 97.3 F (36.3 C)   TempSrc: Oral   SpO2: 100% 99%      Constitutional: Very lethargic, briefly opens eyes without meaningful eye contact-quite cachectic and underweight Eyes: PERRL, lids and conjunctivae normal albeit dry ENMT: Mucous membranes are dry. Posterior pharynx clear of any exudate or lesions.Normal dentition.  Neck: normal, supple, no masses, no thyromegaly Respiratory: clear to auscultation bilaterally, no wheezing, no crackles. Normal respiratory effort. No accessory muscle use.  Cardiovascular: Regular rate and rhythm, no murmurs / rubs /  gallops. No extremity edema. 2+ pedal pulses. No carotid bruits.  Abdomen: no tenderness, no masses palpated. No hepatosplenomegaly. Bowel sounds positive.  Musculoskeletal: no clubbing / cyanosis. No joint deformity upper and lower extremities. Significant extremity atrophy.Good ROM, no contractures. Normal muscle tone.  Skin: no rashes, lesions, ulcers, induration of extremities/trunk/chest. Did not roll patient to examine sacrum Neurologic: CN 2-12 appear to be grossly intact [place] on inspection only since patient unable to follow simple commands to complete full assessment. Sensation ears to be grossly intact, DTR diminished. Unable to test strength secondary to patient's inability to participate Psychiatric: Quite lethargic and briefly opens eyes without eye contact; at baseline reported to be nonverbal    Labs on Admission: I have personally  reviewed following labs and imaging studies  CBC:  Recent Labs Lab 05/28/16 1040  WBC 8.9  HGB 14.2  HCT 46.9*  MCV 97.9  PLT 132*   Basic Metabolic Panel:  Recent Labs Lab 05/25/16 0001 05/28/16 1040  NA 165* 167*  K 3.7 3.8  CL 128* >130*  CO2 25 25  GLUCOSE 92 93  BUN 53* 59*  CREATININE 1.17* 1.51*  CALCIUM 9.0 9.3   GFR: Estimated Creatinine Clearance: 16.1 mL/min (by C-G formula based on SCr of 1.51 mg/dL (H)). Liver Function Tests:  Recent Labs Lab 05/25/16 0001 05/28/16 1040  AST 45* 74*  ALT 47* 66*  ALKPHOS 85 78  BILITOT 0.5 0.7  PROT 7.5 7.2  ALBUMIN 4.0 3.7   No results for input(s): LIPASE, AMYLASE in the last 168 hours. No results for input(s): AMMONIA in the last 168 hours. Coagulation Profile: No results for input(s): INR, PROTIME in the last 168 hours. Cardiac Enzymes: No results for input(s): CKTOTAL, CKMB, CKMBINDEX, TROPONINI in the last 168 hours. BNP (last 3 results) No results for input(s): PROBNP in the last 8760 hours. HbA1C: No results for input(s): HGBA1C in the last 72 hours. CBG: No results for input(s): GLUCAP in the last 168 hours. Lipid Profile: No results for input(s): CHOL, HDL, LDLCALC, TRIG, CHOLHDL, LDLDIRECT in the last 72 hours. Thyroid Function Tests: No results for input(s): TSH, T4TOTAL, FREET4, T3FREE, THYROIDAB in the last 72 hours. Anemia Panel: No results for input(s): VITAMINB12, FOLATE, FERRITIN, TIBC, IRON, RETICCTPCT in the last 72 hours. Urine analysis:    Component Value Date/Time   COLORURINE AMBER (A) 09/13/2015 2300   APPEARANCEUR TURBID (A) 09/13/2015 2300   LABSPEC 1.017 09/13/2015 2300   PHURINE 7.5 09/13/2015 2300   GLUCOSEU NEGATIVE 09/13/2015 2300   HGBUR MODERATE (A) 09/13/2015 2300   BILIRUBINUR NEGATIVE 09/13/2015 2300   KETONESUR NEGATIVE 09/13/2015 2300   PROTEINUR 30 (A) 09/13/2015 2300   UROBILINOGEN 1.0 10/31/2010 1241   NITRITE NEGATIVE 09/13/2015 2300   LEUKOCYTESUR SMALL  (A) 09/13/2015 2300   Sepsis Labs: @LABRCNTIP (procalcitonin:4,lacticidven:4) )No results found for this or any previous visit (from the past 240 hour(s)).   Radiological Exams on Admission: Ct Head Wo Contrast  Result Date: 05/28/2016 CLINICAL DATA:  Altered mental status, lethargy, not drinking well, odor to urine, history COPD, lung cancer, Alzheimer's EXAM: CT HEAD WITHOUT CONTRAST TECHNIQUE: Contiguous axial images were obtained from the base of the skull through the vertex without intravenous contrast. Sagittal and coronal MPR images reconstructed from axial data set. COMPARISON:  12/08/2015 FINDINGS: Brain: Generalized atrophy. Normal ventricular morphology. No midline shift or mass effect. Small vessel chronic ischemic changes of deep cerebral white matter. No intracranial hemorrhage, mass lesion, evidence of acute infarction, or extra-axial fluid  collection. Vascular: Atherosclerotic calcifications of internal carotid and vertebral arteries at skullbase Skull: Intact Sinuses/Orbits: Clear Other: N/A IMPRESSION: Atrophy with small vessel chronic ischemic changes of deep cerebral white matter. No acute intracranial abnormalities. Electronically Signed   By: Ulyses Southward M.D.   On: 05/28/2016 12:08      Assessment/Plan Principal Problem:   Dehydration with hypernatremia -Patient presents with altered mental status with sodium greater than 160 in the setting of dehydration in patient with known dementia and failure to thrive -D5W at 100 mL per hour -BMET in am  Active Problems:   AKI (acute kidney injury) on CKD (chronic kidney disease), stage II -Baseline: 20/0.94 -Current: 59/1.51 -Acute changes related to dehydration as noted above -Certainly exacerbated by utilization of diuretic or to admission    Transaminitis/elevated lactic acid -Anticipated abnormalities in the setting of profound dehydration -Repeat labs in a.m.    Metabolic encephalopathy -Secondary to hypernatremia  with underlying dementia -Monitor for improvement with correction of sodium  -NPO until more alert    Mixed Alzheimer's and vascular dementia -To lethargic to take preadmission medications: Namenda and Remeron    Essential hypertension -Currently controlled -Not on antihypertensives prior to admission    Decubitus ulcer of sacral region, stage 4  -Documented as present since May 2017 -Air mattress    Chronic diastolic heart failure, NYHA class 1  -Last echocardiogram 2014: Demonstrated preserved LV function, grade 2 diastolic dysfunction with mild mitral regurgitation -Given degree of end-stage dementia and known reports of consistent poor oral intake and associated cachexia may benefit from permanent discontinuation of diuretics     Hyperlipidemia -On Lipitor prior to admission     Protein-calorie malnutrition, severe - Secondary to dementia  -Per PCP notes: Patient's weight recently has fluctuated between 89 and 91 pounds with persistent issues with recurrent hypernatremia. Patient has been refusing ensure/boost supplements  -DNR in place  -May need to have goals of care discussion with family with focus on lesion of MOST form (if not yet done ) to develop a plan of how to proceed as patient continues to have these episodes frequently     Functional constipation -As above -Abdomen nondistended and nontender on exam      DVT prophylaxis:  Lovenox Code Status:  DO NOT RESUSCITATE  Family Communication:  paid caregiver at bedside  Disposition Plan: Anticipate discharge back to preadmission home environment with previously established caregivers in place  Consults called: None     Mahalie Kanner L. ANP-BC [facility] Pager [phone]   If 7PM-7AM, please contact night-coverage [email or web address] Password [facility]  05/28/2016, 12:41 PM

## 2016-05-28 NOTE — Telephone Encounter (Signed)
I spoke with Dr.Carter and she recommended that patient be taken to the ED. I spoke with patient's brother, Rusty Aus, and he stated that he was going to call EMS as soon as he got off the phone with me.

## 2016-05-28 NOTE — Telephone Encounter (Signed)
noted 

## 2016-05-28 NOTE — ED Notes (Signed)
Notified [place], [place] of lactic 2.11

## 2016-05-28 NOTE — ED Triage Notes (Signed)
Pt here with family c/o lethargy and not drinking well with odor to urine; pt does not speak with this RN at present

## 2016-05-29 DIAGNOSIS — E870 Hyperosmolality and hypernatremia: Secondary | ICD-10-CM

## 2016-05-29 DIAGNOSIS — G309 Alzheimer's disease, unspecified: Secondary | ICD-10-CM

## 2016-05-29 DIAGNOSIS — F0150 Vascular dementia without behavioral disturbance: Secondary | ICD-10-CM

## 2016-05-29 DIAGNOSIS — F0280 Dementia in other diseases classified elsewhere without behavioral disturbance: Secondary | ICD-10-CM

## 2016-05-29 LAB — CBC
HCT: 43.7 % (ref 36.0–46.0)
Hemoglobin: 13.2 g/dL (ref 12.0–15.0)
MCH: 29.2 pg (ref 26.0–34.0)
MCHC: 30.2 g/dL (ref 30.0–36.0)
MCV: 96.7 fL (ref 78.0–100.0)
Platelets: 135 K/uL — ABNORMAL LOW (ref 150–400)
RBC: 4.52 MIL/uL (ref 3.87–5.11)
RDW: 15.1 % (ref 11.5–15.5)
WBC: 13.8 K/uL — ABNORMAL HIGH (ref 4.0–10.5)

## 2016-05-29 LAB — COMPREHENSIVE METABOLIC PANEL WITH GFR
ALT: 41 U/L (ref 14–54)
AST: 37 U/L (ref 15–41)
Albumin: 3.1 g/dL — ABNORMAL LOW (ref 3.5–5.0)
Alkaline Phosphatase: 74 U/L (ref 38–126)
Anion gap: 10 (ref 5–15)
BUN: 50 mg/dL — ABNORMAL HIGH (ref 6–20)
CO2: 20 mmol/L — ABNORMAL LOW (ref 22–32)
Calcium: 8.3 mg/dL — ABNORMAL LOW (ref 8.9–10.3)
Chloride: 125 mmol/L — ABNORMAL HIGH (ref 101–111)
Creatinine, Ser: 1.31 mg/dL — ABNORMAL HIGH (ref 0.44–1.00)
GFR calc Af Amer: 42 mL/min — ABNORMAL LOW (ref >60)
GFR calc non Af Amer: 36 mL/min — ABNORMAL LOW (ref >60)
Glucose, Bld: 121 mg/dL — ABNORMAL HIGH (ref 65–99)
Potassium: 3.3 mmol/L — ABNORMAL LOW (ref 3.5–5.1)
Sodium: 155 mmol/L — ABNORMAL HIGH (ref 135–145)
Total Bilirubin: 0.9 mg/dL (ref 0.3–1.2)
Total Protein: 6.4 g/dL — ABNORMAL LOW (ref 6.5–8.1)

## 2016-05-29 LAB — URINE CULTURE

## 2016-05-29 MED ORDER — DEXTROSE 5 % IV SOLN: INTRAVENOUS | Status: DC

## 2016-05-29 MED ORDER — WHITE PETROLATUM GEL
Status: AC
  Administered 2016-05-29: 04:00:00
  Filled 2016-05-29: qty 1

## 2016-05-29 NOTE — Progress Notes (Signed)
Initial Nutrition Assessment  DOCUMENTATION CODES:   Underweight  INTERVENTION:  Continue clear liquid diet.   RD to continue to monitor for needs.   NUTRITION DIAGNOSIS:   Increased nutrient needs related to chronic illness as evidenced by estimated needs.  GOAL:   Patient will meet greater than or equal to 90% of their needs  MONITOR:   PO intake, Diet advancement, Labs, Weight trends, Skin, I & O's  REASON FOR ASSESSMENT:   Low Braden    ASSESSMENT:   83 y.o. female with medical history significant for severe end-stage dementia with associated aorta throbbing severe protein Malnutrition, hypertension, chronic diastolic heart failure, chronic kidney disease, dyslipidemia and functional constipation. She was evaluated at her PCP office on 2/9 after home care assistant noticed strong urine smell with change in mentation and patient unable to ambulate as per baseline. Labs were obtained and plan was for patient to provide a urine sample. By this morning lab results demonstrated a sodium of 165 and patient's brother was notified and patient was subsequently transported to the hospital by EMS.  Pt is approaching end of life. Palliative care consult for [facility]Hospice.  At baseline patient is nonconversant nonverbal.  Pt was unavailable, busy with RN during attempted time of visit. RD unable to obtain most recent nutrition history. Palliative care is scheduling a meeting with pt family to discuss goals of care. Noted diet has just been advanced to a clear liquid diet. RD to continue to monitor. Unable to complete Nutrition-Focused physical exam at this time.   Labs and medications reviewed. Sodium elevated at 155. Chloride elevated at 125. IV fluids running.  Diet Order:  Diet clear liquid Room service appropriate? Yes; Fluid consistency: Thin  Skin:  Wound (see comment) (Stage 2 to sacrum)  Last BM:  2/12  Height:   Ht Readings from Last 1 Encounters:  05/28/16 5' (1.524 m)     Weight:   Wt Readings from Last 1 Encounters:  05/28/16 79 lb 5.9 oz (36 kg)    Ideal Body Weight:  45.45 kg  BMI:  Body mass index is 15.5 kg/m.  Estimated Nutritional Needs:   Kcal:  1200-1400  Protein:  45-60 grams  Fluid:  Per MD  EDUCATION NEEDS:   No education needs identified at this time  Corrin Parker, MS, RD, LDN Pager # [phone] After hours/ weekend pager # [phone]

## 2016-05-29 NOTE — Progress Notes (Signed)
CRITICAL VALUE ALERT   Critical value received:  Lactic acid 2.3  Date of notification:  2/13  Time of notification:  8am  Critical value read back:Yes.    Nurse who received alert:  Kathyrn Sheriff   MD notified (1st page):  Grandville Silos  Time of first page:  8am  MD notified (2nd page):  Time of second page:  Responding MD:  Grandville Silos  Time MD responded:  8:03am

## 2016-05-29 NOTE — Progress Notes (Signed)
PROGRESS NOTE    Stacey Fernandez  RCV:[id number] DOB: Jan 03, 1930 DOA: 05/28/2016 PCP: Gildardo Cranker, DO    Brief Narrative:  Patient is a 83 year old female history of severe end-stage dementia, severe protein malnutrition, hypertension, chronic diastolic heart failure, chronic kidney disease stage III, functional constipation seen at PCPs office on 05/25/2016 of the homecare noticed strong urine smell and change in mental status and inability to ambulate as per baseline. Labs obtained at PCPs office showed a sodium of 165 and patient was subsequently brought to the emergency room. Patient looks clinically dehydrated.   Assessment & Plan:   Principal Problem:   Dehydration with hypernatremia Active Problems:   Hyperlipidemia   Protein-calorie malnutrition, moderate (HCC)   Functional constipation   Mixed Alzheimer's and vascular dementia   Essential hypertension   Decubitus ulcer of sacral region, stage 4 (HCC)   CKD (chronic kidney disease), stage II   AKI (acute kidney injury) ([place])   Metabolic encephalopathy   Chronic diastolic heart failure, NYHA class 1 ([place])  #1 dehydration with hypernatremia Secondary to poor oral intake. Hyponatremia slowly improving on D5W. Patient is more alert. Continue IV fluids. Start clear liquids. Follow.  #2 metabolic encephalopathy Likely secondary to problem #1. Improving. Urinalysis is leukocytes negative nitrite negative. Urine cultures pending. Chest x-ray on admission was negative. Will repeat chest x-ray in the morning as patient is getting hydrated to see for pneumonia fluffs out. Continue IV fluids. Follow.  #3 acute on chronic kidney disease stage II Secondary to a prerenal azotemia secondary to dehydration and in the setting of diuretics. Diuretics on hold. Renal function chanting down. Monitor closely with hydration.  #4 transaminitis/ elevated lactic acid Likely secondary to profound dehydration. LFTs trending down. Lactic acid  level trending down. Follow.  #5 chronic diastolic heart failure Stable. Compensated. Diuretics on hold secondary to problem #1.  #6 moderate protein calorie malnutrition Once patient is tolerating a diet will place on nutritional supplementation.  #7 leukocytosis Questionable etiology. Patient afebrile. Urinalysis on admission was negative. Chest x-ray was negative. Will repeat chest x-ray in the morning as patient is getting hydrated to see for pneumonia fluffs out. Follow for now.  #8 mixed Alzheimer's and vascular dementia Clinical improvement in terms of patient's mentation. Namenda and Remeron on hold.  #9 hypertension Stable. Follow.  #10 acute results of sacral region stage IV Documented as present since May 2017. Air mattress. Wound care consult.  #11 hyperlipidemia On Lipitor prior to admission.  #12 functional constipation Abdomen nondistended. No abdominal pain. Follow.   DVT prophylaxis: Heparin Code Status: DO NOT RESUSCITATE Family Communication: Updated niece at bedside. Disposition Plan: Back to preadmission home environment when back to baseline.   Consultants:   Palliative care pending  Procedures:   CT head 05/28/2016  Chest x-ray 05/28/2016    Antimicrobials:   None   Subjective: Patient more alert. Eyes open. Nonverbal. Family at bedside.  Objective: Vitals:   05/28/16 1415 05/28/16 2057 05/29/16 0456 05/29/16 0946  BP: 134/67 117/82 (!) 154/96 120/77  Pulse: 78 94 86 83  Resp: '12 13 14 18  '$ Temp: 97.6 F (36.4 C) 98.6 F (37 C) 99.3 F (37.4 C) 98 F (36.7 C)  TempSrc: Oral   Oral  SpO2: 94%  95% 96%  Weight:  36 kg (79 lb 5.9 oz)    Height:        Intake/Output Summary (Last 24 hours) at 05/29/16 1446 Last data filed at 05/29/16 1410  Gross per 24  hour  Intake             1030 ml  Output              700 ml  Net              330 ml   Filed Weights   05/28/16 1341 05/28/16 2057  Weight: 36.4 kg (80 lb 4.8 oz) 36 kg  (79 lb 5.9 oz)    Examination:  General exam: Alert. Eyes open. Frail. Cachectic. Respiratory system: Clear to auscultation. Respiratory effort normal. Cardiovascular system: S1 & S2 heard, RRR. No JVD, murmurs, rubs, gallops or clicks. No pedal edema. Gastrointestinal system: Abdomen is nondistended, soft and nontender. No organomegaly or masses felt. Normal bowel sounds heard. Central nervous system: Alert. No focal neurological deficits. Extremities: Symmetric 5 x 5 power. Skin: No rashes, lesions or ulcers Psychiatry: Unable to assess due to patient's mental status. Mood & affect appropriate.     Data Reviewed: I have personally reviewed following labs and imaging studies  CBC:  Recent Labs Lab 05/28/16 1040 05/29/16 0617  WBC 8.9 13.8*  HGB 14.2 13.2  HCT 46.9* 43.7  MCV 97.9 96.7  PLT 132* 092*   Basic Metabolic Panel:  Recent Labs Lab 05/25/16 0001 05/28/16 1040 05/28/16 1833 05/29/16 0617  NA 165* 167* 162* 155*  K 3.7 3.8 3.9 3.3*  CL 128* >130* >130* 125*  CO2 '25 25 24 '$ 20*  GLUCOSE 92 93 124* 121*  BUN 53* 59* 54* 50*  CREATININE 1.17* 1.51* 1.42* 1.31*  CALCIUM 9.0 9.3 8.7* 8.3*   GFR: Estimated Creatinine Clearance: 17.8 mL/min (by C-G formula based on SCr of 1.31 mg/dL (H)). Liver Function Tests:  Recent Labs Lab 05/25/16 0001 05/28/16 1040 05/29/16 0617  AST 45* 74* 37  ALT 47* 66* 41  ALKPHOS 85 78 74  BILITOT 0.5 0.7 0.9  PROT 7.5 7.2 6.4*  ALBUMIN 4.0 3.7 3.1*   No results for input(s): LIPASE, AMYLASE in the last 168 hours. No results for input(s): AMMONIA in the last 168 hours. Coagulation Profile: No results for input(s): INR, PROTIME in the last 168 hours. Cardiac Enzymes: No results for input(s): CKTOTAL, CKMB, CKMBINDEX, TROPONINI in the last 168 hours. BNP (last 3 results) No results for input(s): PROBNP in the last 8760 hours. HbA1C: No results for input(s): HGBA1C in the last 72 hours. CBG: No results for input(s):  GLUCAP in the last 168 hours. Lipid Profile: No results for input(s): CHOL, HDL, LDLCALC, TRIG, CHOLHDL, LDLDIRECT in the last 72 hours. Thyroid Function Tests: No results for input(s): TSH, T4TOTAL, FREET4, T3FREE, THYROIDAB in the last 72 hours. Anemia Panel: No results for input(s): VITAMINB12, FOLATE, FERRITIN, TIBC, IRON, RETICCTPCT in the last 72 hours. Sepsis Labs:  Recent Labs Lab 05/28/16 1100 05/28/16 1314 05/29/16 0617  LATICACIDVEN 2.05* 2.11* 2.4*    Recent Results (from the past 240 hour(s))  Culture, Urine     Status: Abnormal   Collection Time: 05/28/16  4:47 PM  Result Value Ref Range Status   Specimen Description URINE, CATHETERIZED  Final   Special Requests NONE  Final   Culture MULTIPLE SPECIES PRESENT, SUGGEST RECOLLECTION (A)  Final   Report Status 05/29/2016 FINAL  Final         Radiology Studies: Dg Chest 2 View  Result Date: 05/28/2016 CLINICAL DATA:  History of cancer, COPD, dementia. EXAM: CHEST  2 VIEW COMPARISON:  09/13/2015 FINDINGS: The heart is enlarged. Postoperative changes are identified  in the right lung apex. There are no focal consolidations, pleural effusions, or pulmonary edema. Perihilar peribronchial thickening is present. IMPRESSION: Stable cardiomegaly.  Bronchitic changes. Electronically Signed   By: Nolon Nations M.D.   On: 05/28/2016 14:01   Ct Head Wo Contrast  Result Date: 05/28/2016 CLINICAL DATA:  Altered mental status, lethargy, not drinking well, odor to urine, history COPD, lung cancer, Alzheimer's EXAM: CT HEAD WITHOUT CONTRAST TECHNIQUE: Contiguous axial images were obtained from the base of the skull through the vertex without intravenous contrast. Sagittal and coronal MPR images reconstructed from axial data set. COMPARISON:  12/08/2015 FINDINGS: Brain: Generalized atrophy. Normal ventricular morphology. No midline shift or mass effect. Small vessel chronic ischemic changes of deep cerebral white matter. No  intracranial hemorrhage, mass lesion, evidence of acute infarction, or extra-axial fluid collection. Vascular: Atherosclerotic calcifications of internal carotid and vertebral arteries at skullbase Skull: Intact Sinuses/Orbits: Clear Other: N/A IMPRESSION: Atrophy with small vessel chronic ischemic changes of deep cerebral white matter. No acute intracranial abnormalities. Electronically Signed   By: Lavonia Dana M.D.   On: 05/28/2016 12:08        Scheduled Meds: . dorzolamide  2 drop Right Eye BID  . heparin subcutaneous  5,000 Units Subcutaneous Q8H  . latanoprost  1 drop Both Eyes QHS   Continuous Infusions: . dextrose       LOS: 1 day    Time spent: 90 minutes    THOMPSON,DANIEL, MD [facility] Pager [phone]  If 7PM-7AM, please contact night-coverage [email or web address] Password [facility] 05/29/2016, 2:46 PM

## 2016-05-29 NOTE — Progress Notes (Signed)
Palliative Care  Ms. Calandra is unable to meaningfully participate in a goals of care conversation. I spoke with her sister Lorayne Bender) and her brother Ferd Hibbs) on the phone. I explained my role was to talk with them about her current health state, and explore options for continuing to support her in the future. Both siblings were happy to meet with me and they are working to coordinate a time when they can both be present at the hospital. They will call me back with the time, which they expect would be tomorrow.  On a side note, [name]  thought Waymon had [place]. On chart review, however, he was noted to have durable power of attorney. [name] is the court appointed guardian. As they are both involved in their sister's care, I asked to have both of them present at the family meeting (I have not yet clarified the difference between healthcare and durable power of attorney with them, but will plan to at our meeting).   PLAN Plan for family meeting with sister and brother. They are working to coordinate a time when they can both be here, and will call me back to schedule the meeting.    Charlynn Court [facility] [phone] (cell, 7a-4p) [phone] (team phone, after hours and on weekends)  No charge note.

## 2016-05-30 ENCOUNTER — Encounter (HOSPITAL_COMMUNITY): Payer: Self-pay | Admitting: General Practice

## 2016-05-30 ENCOUNTER — Inpatient Hospital Stay (HOSPITAL_COMMUNITY): Payer: Medicare Other

## 2016-05-30 DIAGNOSIS — N3000 Acute cystitis without hematuria: Secondary | ICD-10-CM

## 2016-05-30 DIAGNOSIS — Z7189 Other specified counseling: Secondary | ICD-10-CM

## 2016-05-30 DIAGNOSIS — R4182 Altered mental status, unspecified: Secondary | ICD-10-CM

## 2016-05-30 DIAGNOSIS — E440 Moderate protein-calorie malnutrition: Secondary | ICD-10-CM

## 2016-05-30 DIAGNOSIS — E870 Hyperosmolality and hypernatremia: Secondary | ICD-10-CM

## 2016-05-30 LAB — COMPREHENSIVE METABOLIC PANEL
ALK PHOS: 69 U/L (ref 38–126)
ALT: 33 U/L (ref 14–54)
AST: 35 U/L (ref 15–41)
Albumin: 2.8 g/dL — ABNORMAL LOW (ref 3.5–5.0)
Anion gap: 11 (ref 5–15)
BILIRUBIN TOTAL: 0.7 mg/dL (ref 0.3–1.2)
BUN: 35 mg/dL — AB (ref 6–20)
CALCIUM: 7.8 mg/dL — AB (ref 8.9–10.3)
CO2: 21 mmol/L — ABNORMAL LOW (ref 22–32)
CREATININE: 1.12 mg/dL — AB (ref 0.44–1.00)
Chloride: 112 mmol/L — ABNORMAL HIGH (ref 101–111)
GFR, EST AFRICAN AMERICAN: 50 mL/min — AB (ref >60)
GFR, EST NON AFRICAN AMERICAN: 44 mL/min — AB (ref >60)
Glucose, Bld: 116 mg/dL — ABNORMAL HIGH (ref 65–99)
Potassium: 2.7 mmol/L — CL (ref 3.5–5.1)
Sodium: 144 mmol/L (ref 135–145)
TOTAL PROTEIN: 5.9 g/dL — AB (ref 6.5–8.1)

## 2016-05-30 LAB — CBC WITH DIFFERENTIAL/PLATELET
BASOS ABS: 0 10*3/uL (ref 0.0–0.1)
BASOS PCT: 0 %
EOS ABS: 0.1 10*3/uL (ref 0.0–0.7)
EOS PCT: 1 %
HCT: 37.7 % (ref 36.0–46.0)
HEMOGLOBIN: 11.8 g/dL — AB (ref 12.0–15.0)
Lymphocytes Relative: 22 %
Lymphs Abs: 2.4 10*3/uL (ref 0.7–4.0)
MCH: 29.3 pg (ref 26.0–34.0)
MCHC: 31.3 g/dL (ref 30.0–36.0)
MCV: 93.5 fL (ref 78.0–100.0)
Monocytes Absolute: 0.7 10*3/uL (ref 0.1–1.0)
Monocytes Relative: 7 %
NEUTROS PCT: 70 %
Neutro Abs: 7.5 10*3/uL (ref 1.7–7.7)
PLATELETS: 107 10*3/uL — AB (ref 150–400)
RBC: 4.03 MIL/uL (ref 3.87–5.11)
RDW: 14.8 % (ref 11.5–15.5)
WBC: 10.7 10*3/uL — AB (ref 4.0–10.5)

## 2016-05-30 LAB — BASIC METABOLIC PANEL
ANION GAP: 9 (ref 5–15)
BUN: 30 mg/dL — ABNORMAL HIGH (ref 6–20)
CHLORIDE: 115 mmol/L — AB (ref 101–111)
CO2: 22 mmol/L (ref 22–32)
Calcium: 8.1 mg/dL — ABNORMAL LOW (ref 8.9–10.3)
Creatinine, Ser: 1.04 mg/dL — ABNORMAL HIGH (ref 0.44–1.00)
GFR calc Af Amer: 55 mL/min — ABNORMAL LOW (ref >60)
GFR, EST NON AFRICAN AMERICAN: 48 mL/min — AB (ref >60)
GLUCOSE: 89 mg/dL (ref 65–99)
POTASSIUM: 3.2 mmol/L — AB (ref 3.5–5.1)
SODIUM: 146 mmol/L — AB (ref 135–145)

## 2016-05-30 LAB — MAGNESIUM: MAGNESIUM: 1.9 mg/dL (ref 1.7–2.4)

## 2016-05-30 LAB — LACTIC ACID, PLASMA: LACTIC ACID, VENOUS: 2.4 mmol/L — AB (ref 0.5–1.9)

## 2016-05-30 MED ORDER — POTASSIUM CHLORIDE 20 MEQ PO PACK
20.0000 meq | PACK | Freq: Two times a day (BID) | ORAL | Status: DC
  Administered 2016-05-30 – 2016-05-31 (×2): 20 meq via ORAL
  Filled 2016-05-30 (×2): qty 1

## 2016-05-30 MED ORDER — POTASSIUM CHLORIDE 20 MEQ/15ML (10%) PO SOLN: 30.0000 meq | ORAL | Status: DC

## 2016-05-30 MED ORDER — FAMOTIDINE IN NACL 20-0.9 MG/50ML-% IV SOLN
20.0000 mg | Freq: Every day | INTRAVENOUS | Status: DC
Start: 2016-05-30 — End: 2016-06-01
  Administered 2016-05-30 – 2016-05-31 (×2): 20 mg via INTRAVENOUS
  Filled 2016-05-30 (×3): qty 50

## 2016-05-30 MED ORDER — SODIUM CHLORIDE 0.9 % IV SOLN
30.0000 meq | INTRAVENOUS | Status: AC
  Administered 2016-05-30 (×2): 30 meq via INTRAVENOUS
  Filled 2016-05-30 (×2): qty 15

## 2016-05-30 MED ORDER — ENSURE ENLIVE PO LIQD
237.0000 mL | Freq: Two times a day (BID) | ORAL | Status: DC
  Administered 2016-05-30 – 2016-05-31 (×3): 237 mL via ORAL

## 2016-05-30 MED ORDER — KCL IN DEXTROSE-NACL 40-5-0.9 MEQ/L-%-% IV SOLN
INTRAVENOUS | Status: DC
  Administered 2016-05-30 – 2016-05-31 (×2): via INTRAVENOUS
  Filled 2016-05-30 (×2): qty 1000

## 2016-05-30 MED ORDER — DEXTROSE 5 % IV SOLN
1.0000 g | INTRAVENOUS | Status: DC
  Administered 2016-05-30 – 2016-06-01 (×3): 1 g via INTRAVENOUS
  Filled 2016-05-30 (×3): qty 10

## 2016-05-30 NOTE — Progress Notes (Signed)
Nutrition Follow-up  DOCUMENTATION CODES:   Severe malnutrition in context of chronic illness, Underweight   Pt meets criteria for severe MALNUTRITION in the context of chronic illness  as evidenced by severe fat and muscle mass loss.  INTERVENTION:  Provide Ensure Enlive po BID, each supplement provides 350 kcal and 20 grams of protein.  RD to continue to monitor for needs.  NUTRITION DIAGNOSIS:   Increased nutrient needs related to chronic illness as evidenced by estimated needs; ongoing  GOAL:   Patient will meet greater than or equal to 90% of their needs; not met  MONITOR:   PO intake, Diet advancement, Labs, Weight trends, Skin, I & O's  REASON FOR ASSESSMENT:   Low Braden    ASSESSMENT:   83 y.o. female with medical history significant for severe end-stage dementia with associated aorta throbbing severe protein Malnutrition, hypertension, chronic diastolic heart failure, chronic kidney disease, dyslipidemia and functional constipation. She was evaluated at her PCP office on 2/9 after home care assistant noticed strong urine smell with change in mentation and patient unable to ambulate as per baseline. Labs were obtained and plan was for patient to provide a urine sample. By this morning lab results demonstrated a sodium of 165 and patient's brother was notified and patient was subsequently transported to the hospital by EMS.  Pt is approaching end of life. Palliative care consult for [facility]/Hospice.   No family at bedside. Pt nonverbal. Pt is currently on a dysphagia 1 diet with thin liquids. Per nurse tech, pt with no po intake yet today. RD to order Ensure to aid in caloric and protein needs. RD to continue to monitor for goals of care. Nutrition-Focused physical exam completed. Findings are severe fat depletion, severe muscle depletion, and no edema.   Labs and medications reviewed. Sodium elevated at 146. Chloride elevated at 115.  Diet Order:  DIET - DYS 1 Room service  appropriate? Yes; Fluid consistency: Thin  Skin:  Wound (see comment) (Stage 2 to sacrum)  Last BM:  2/14  Height:   Ht Readings from Last 1 Encounters:  05/28/16 5' (1.524 m)    Weight:   Wt Readings from Last 1 Encounters:  05/30/16 80 lb (36.3 kg)    Ideal Body Weight:  45.45 kg  BMI:  Body mass index is 15.62 kg/m.  Estimated Nutritional Needs:   Kcal:  1200-1400  Protein:  45-60 grams  Fluid:  Per MD  EDUCATION NEEDS:   No education needs identified at this time  Corrin Parker, MS, RD, LDN Pager # [phone] After hours/ weekend pager # [phone]

## 2016-05-30 NOTE — Evaluation (Addendum)
Clinical/Bedside Swallow Evaluation Patient Details  Name: Stacey Fernandez MRN: [id number] Date of Birth: January 08, 1930  Today's Date: 05/30/2016 Time: SLP Start Time (ACUTE ONLY): 0810 SLP Stop Time (ACUTE ONLY): 0824 SLP Time Calculation (min) (ACUTE ONLY): 14 min  Past Medical History:  Past Medical History:  Diagnosis Date  . Anxiety state, unspecified   . Arthritis    "hands, feet" (09/13/2015)  . Chronic airway obstruction, not elsewhere classified   . Chronic kidney disease (CKD), stage II (mild)    Stacey Fernandez 09/13/2015  . COPD (chronic obstructive pulmonary disease) ([place])   . Dementia   . Functional constipation    Stacey Fernandez 09/13/2015  . Hemiplegia affecting dominant side, late effect of cerebrovascular disease   . Hyperlipidemia   . Lung cancer ([place])   . Lung mass   . Memory loss   . Mixed Alzheimer's and vascular dementia    /notes 09/13/2015  . Other malaise and fatigue   . Protein calorie malnutrition ([place])    Stacey Fernandez 09/13/2015  . Sacral decubitus ulcer    chronic; stage IV/notes 09/13/2015  . Senile dementia with delirium   . Urinary frequency    Past Surgical History:  Past Surgical History:  Procedure Laterality Date  . CATARACT EXTRACTION, BILATERAL    . mediastinal lymp node dissection  01/10/2011   VAN TRIGT  . WEDGE RESECTION RUL NODULE  01/10/2011   VAN TRIGT   HPI:  Ptis a 83 y.o.femalewith PMH significant for severe end-stage dementia with associated aorta throbbing, severe protein malnutrition, hypertension, chronic diastolic heart failure, chronic kidney disease, dyslipidemia, and functional constipation. Patient was nonconversant and nonverbal on admission. CT of head showed atrophy with small vessel chronic ischemic changes of deep cerebral white matter. No acute intracranial abnormalities. CXR showed stable cardiomegaly and bronchitic changes.   Assessment / Plan / Recommendation Clinical Impression  Stacey Fernandez required arousal and oral stimulation  from SLP in order to attend to POs and tasks. Oral motor assessment unable to be fully completed due to pt's cognitive status and difficulty following commands. Pt exhibited multiple swallows, immediate coughing, and immediate throat clearing with thin liquids via straw. This was mitigated with trials of small teaspoon sips of thin liquids, however given severity of impairments and end-stage dementia, aspiration is still likely. Oral phase impairments included poor lingual manipulation/coordination, oral holding, and prolonged transit. No overt s/s of aspiration with pureed solids. Palliative Care has been consulted and plans to meet with sister and brother. If goals of care are comfort feeds, would recommend diet of Dys 1 (puree) solids, thin liquids (TEASPOON SIPS ONLY-NO STRAW), meds crushed in puree. Educated Therapist, sports re: diet recommendations; ST will f/u briefly for treatment to assess diet tolerance and safety/efficiency of swallow.    Aspiration Risk  Moderate aspiration risk    Diet Recommendation Dysphagia 1 (Puree);Thin liquid   Liquid Administration via: Spoon;Other (Comment);No straw (THIN BY TEASPOON ONLY) Medication Administration: Crushed with puree Supervision: Full supervision/cueing for compensatory strategies Compensations: Slow rate;Small sips/bites;Minimize environmental distractions (check for pocketed food) Postural Changes: Seated upright at 90 degrees    Other  Recommendations Oral Care Recommendations: Oral care BID   Follow up Recommendations Skilled Nursing facility;24 hour supervision/assistance      Frequency and Duration min 2x/week  2 weeks       Prognosis Prognosis for Safe Diet Advancement: Fair Barriers to Reach Goals: Cognitive deficits Barriers/Prognosis Comment: dementia      Swallow Study   General HPI: Ptis a 83 y.o.femalewith  PMH significant for severe end-stage dementia with associated aorta throbbing, severe protein malnutrition, hypertension,  chronic diastolic heart failure, chronic kidney disease, dyslipidemia, and functional constipation. Patient was nonconversant and nonverbal on admission. CT of head showed atrophy with small vessel chronic ischemic changes of deep cerebral white matter. No acute intracranial abnormalities. CXR showed stable cardiomegaly and bronchitic changes. Type of Study: Bedside Swallow Evaluation Previous Swallow Assessment:  (none) Diet Prior to this Study: NPO Temperature Spikes Noted: Yes Respiratory Status: Nasal cannula History of Recent Intubation: No Behavior/Cognition: Alert;Cooperative;Confused;Requires cueing (initially lethargic) Oral Cavity Assessment: Dry Oral Care Completed by SLP: No Oral Cavity - Dentition: Adequate natural dentition Vision: Impaired for self-feeding Self-Feeding Abilities: Total assist Patient Positioning: Upright in bed Baseline Vocal Quality: Low vocal intensity Volitional Cough: Cognitively unable to elicit Volitional Swallow: Unable to elicit    Oral/Motor/Sensory Function Overall Oral Motor/Sensory Function: Other (comment) (difficult to assess due to mentation)   Ice Chips Ice chips: Not tested   Thin Liquid Thin Liquid: Impaired Presentation: Spoon;Straw Oral Phase Impairments: Reduced lingual movement/coordination Oral Phase Functional Implications: Prolonged oral transit;Oral holding Pharyngeal  Phase Impairments: Throat Clearing - Immediate;Cough - Immediate;Multiple swallows    Nectar Thick Nectar Thick Liquid: Not tested   Honey Thick Honey Thick Liquid: Not tested   Puree Puree: Within functional limits Presentation: Spoon   Solid   GO   Solid: Not tested        Stacey Fernandez , Student-SLP 05/30/2016,10:11 AM

## 2016-05-30 NOTE — Consult Note (Signed)
Consultation Note Date: 05/30/2016   Patient Name: Stacey Fernandez  DOB: Dec 19, 1929  MRN: [id number]  Age / Sex: 83 y.o., female  PCP: Gildardo Cranker, DO Referring Physician: Rosita Fire, MD  Reason for Consultation: Establishing goals of care  HPI/Patient Profile: 83 y.o. female  with past medical history of end-stag dementia, severe protein-calorie malnutrition, dCHF, CKD stage 3, HTN, HLD, and functional constipation. She lives at home with full time caregivers. She was brought to her PCP after homecare noted change in her mental status, weakness, and strong smelling urine. Labwork done by PCP showed Na 165, wherein she was brought to the ED and was subsequently admitted on 05/28/2016 with dehydration. With IV fluids her mentation and lab work improved.   Clinical Assessment and Goals of Care: Stacey Fernandez was unable to participate in a goals of care conversation due to dementia and fatigue. I met with her sister (guardian), brother, and niece at her bedside. The family was extremely knowledgeable about dementia and realistic about its expected trajectory. We talked about her current dehydration (with prior episodes in the past) and discussed ways to combat it. I also brought up the option of a feeding tube, and strongly advised against it with detailed reasons given. At this juncture they are focused on encouraging oral intake and working to be more attentive and mindful of her intake.   Overall, we also discussed things they needed to start considering given the expected progression of dementia. They were able to clearly articulate that the goal was to promote quality of life. At this point Stacey Fernandez is at home with full time support. She is dependent on ADLs, but is otherwise mobile, engaged with people and her surroundings, and still enjoying socialization and activities. Her family feels that hospitalizations enable her more quality time at home and,  consequently, still want to pursue interventions that have so far proven to help her. We did discuss that there may come a point when hospitalizations are not achieving that goal, and her time here does not enable her better time at home. When that occurs, it is reasonable to consider shifting focus towards more of a comfort focused route. They were very open to this and thankful for the conversation.   Primary Decision Maker LEGAL GUARDIAN, sister Stacey Fernandez.   SUMMARY OF RECOMMENDATIONS    DNR, otherwise full scope interventions  Once medically ready, plan for d/c home with Palliative Care  Hard Choices book given to family   Code Status/Advance Care Planning:  DNR  Palliative Prophylaxis:   Aspiration, Bowel Regimen, Frequent Pain Assessment, Oral Care and Turn Reposition  Additional Recommendations (Limitations, Scope, Preferences):  Full Scope Treatment  Psycho-social/Spiritual:   Desire for further Chaplaincy support:no  Prognosis:   Unable to determine  Discharge Planning: Home with Home Health and Palliative Care     Primary Diagnoses: Present on Admission: . Dehydration with hypernatremia . Metabolic encephalopathy . CKD (chronic kidney disease), stage II . AKI (acute kidney injury) ([place]) . Mixed Alzheimer's and vascular dementia . Essential hypertension . Decubitus ulcer of sacral region, stage 4 ([place]) . Chronic diastolic heart failure, NYHA class 1 ([place]) . Protein-calorie malnutrition, moderate ([place]) . Hyperlipidemia . Functional constipation   I have reviewed the medical record, interviewed the patient and family, and examined the patient. The following aspects are pertinent.  Past Medical History:  Diagnosis Date  . Anxiety state, unspecified   . Arthritis    "hands, feet" (09/13/2015)  . Chronic airway obstruction, not  elsewhere classified   . Chronic kidney disease (CKD), stage II (mild)    [name] 09/13/2015  . COPD (chronic obstructive  pulmonary disease) ([place])   . Dementia   . Functional constipation    [name] 09/13/2015  . Hemiplegia affecting dominant side, late effect of cerebrovascular disease   . Hyperlipidemia   . Lung cancer ([place])   . Lung mass   . Memory loss   . Mixed Alzheimer's and vascular dementia    /notes 09/13/2015  . Other malaise and fatigue   . Protein calorie malnutrition ([place])    [name] 09/13/2015  . Sacral decubitus ulcer    chronic; stage IV/notes 09/13/2015  . Senile dementia with delirium   . Urinary frequency    Social History   Social History  . Marital status: Single    Spouse name: N/A  . Number of children: N/A  . Years of education: N/A   Social History Main Topics  . Smoking status: Former Smoker    Packs/day: 0.50    Types: Cigarettes    Quit date: 06/09/2010  . Smokeless tobacco: Never Used  . Alcohol use No  . Drug use: No  . Sexual activity: No   Other Topics Concern  . None   Social History Narrative  . None   Family History  Problem Relation Age of Onset  . Heart disease Mother   . Cancer Father   . Heart disease Sister   . Heart disease Brother    Scheduled Meds: . dorzolamide  2 drop Right Eye BID  . heparin subcutaneous  5,000 Units Subcutaneous Q8H  . latanoprost  1 drop Both Eyes QHS  . potassium chloride (KCL MULTIRUN) 30 mEq in 265 mL IVPB  30 mEq Intravenous Q3H   Continuous Infusions: . dextrose 100 mL/hr at 05/29/16 1400   PRN Meds:.acetaminophen **OR** acetaminophen, ondansetron **OR** ondansetron (ZOFRAN) IV, sodium chloride No Known Allergies   Review of Systems: Unable to obtain given baseline dementia  Physical Exam  Constitutional: Vital signs are normal. She appears lethargic. She has a sickly appearance.  HENT:  Head: Normocephalic and atraumatic.  Mouth/Throat: Mucous membranes are dry. No oropharyngeal exudate.  Eyes: EOM are normal.  Neck: Normal range of motion.  Cardiovascular: Normal rate.   Pulmonary/Chest: Effort  normal.  Abdominal: Soft.  Musculoskeletal: She exhibits no edema.  Neurological: She appears lethargic.  Lethargic. Opens eyes to voice and tracks. Much more interactive with family.   Skin: Skin is warm and dry. There is pallor.  Psychiatric:  Calm resting in bed    Vital Signs: BP (!) 101/54   Pulse 87   Temp 100 F (37.8 C)   Resp 12   Ht 5' (1.524 m)   Wt 36.3 kg (80 lb)   SpO2 100%   BMI 15.62 kg/m  Pain Assessment: No/denies pain     SpO2: SpO2: 100 % O2 Device:SpO2: 100 % O2 Flow Rate: .O2 Flow Rate (L/min): 1 L/min  IO: Intake/output summary:  Intake/Output Summary (Last 24 hours) at 05/30/16 0759 Last data filed at 05/30/16 0600  Gross per 24 hour  Intake              270 ml  Output                0 ml  Net              270 ml    LBM: Last BM Date: 05/29/16 Baseline Weight: Weight: 36.4 kg (  80 lb 4.8 oz) Most recent weight: Weight: 36.3 kg (80 lb)     Palliative Assessment/Data: PPS 50-60%   Flowsheet Rows   Flowsheet Row Most Recent Value  Intake Tab  Referral Department  Hospitalist  Unit at Time of Referral  Med/Surg Unit  Palliative Care Primary Diagnosis  Neurology  Date Notified  05/28/16  Palliative Care Type  Return patient Palliative Care  Reason for referral  Clarify Goals of Care  Date of Admission  05/28/16  # of days IP prior to Palliative referral  0  Clinical Assessment  Psychosocial & Spiritual Assessment  Palliative Care Outcomes      Time Total: 70 minutes Greater than 50%  of this time was spent counseling and coordinating care related to the above assessment and plan.  Signed by: Charlynn Court, NP Palliative Medicine Team Pager # [phone] (M-F 7a-5p) Team Phone # [phone] (Nights/Weekends)

## 2016-05-30 NOTE — Consult Note (Addendum)
[facility] Nurse wound consult note Reason for Consult: Consult requested for buttocks.  Pt has an area of pink dry scar tissue where a previous pressure injury was located and has healed.   Wound type: Area in the center is a stage 2 pressure injury Pressure Injury POA: Yes Measurement: .2X.2X.1cm Wound bed: pink and dry Drainage (amount, consistency, odor) No odor or drainage Dressing procedure/placement/frequency: Pt has currently been incontinent of large amt loose stool and is also frequently incontinent of urine.  Dressings would become soiled frequently and trap moisture against skin.  Leave open to air and apply barrier cream to protect with each turning and cleaning episode.  Pt is immobile and emaciated and high risk for breakdown; air mattress is in place to decrease pressure. No family members present to discuss plan of care. Please re-consult if further assistance is needed.  Thank-you,  Julien Girt MSN, [place], [place], [place], [place]

## 2016-05-30 NOTE — Progress Notes (Signed)
PROGRESS NOTE    Stacey Fernandez  WCB:[id number] DOB: [birth year] DOA: 05/28/2016 PCP: Kirt Boys, DO   Brief Narrative: 83 year old female history of severe end-stage dementia, severe protein malnutrition, hypertension, chronic diastolic heart failure, chronic kidney disease stage III, functional constipation seen at PCPs office on 05/25/2016 of the homecare noticed strong urine smell and change in mental status and inability to ambulate as per baseline. Labs obtained at PCPs office showed a sodium of 165 and patient was subsequently brought to the emergency room. Patient looks clinically dehydrated.  Assessment & Plan:  # Hypernatremia due to dehydration: Serum sodium level improved with IV fluid. Checking urine sodium potassium and urine osmolality. Patient had hypokalemia with serum potassium level of 2.7 today. Repleted potassium chloride IV. Changed to IV fluid to D5 and is with potassium chloride. Repeat lab now and daily.  #Severe dementia with change in mental status, unknown if she has acute metabolic encephalopathy due to dehydration and possibly UTI:  -Continue IV fluid, supportive care. Added antibiotics. -Patient with end-stage dementia, palliative care consult appreciated. Plan for family meeting. Patient is DO NOT RESUSCITATE. I believe she will benefit from hospice care on discharge.  #Acute on chronic kidney disease stage 3: due to dehydration: Serum creatinine level improved. Monitor BMP. Avoid nephrotoxin.  #Possible acute cystitis without hematuria: Patient with elevated lactic acid level on admission, leukocytosis and has low-grade temperature. Urine culture with multiple organisms growing. I started IV ceftriaxone today. Continue to monitor clinically and monitor fever curve.  # Decubiti ulcer of sacrum, status 2 pressure injury: Wound care consult appreciated. Continue supportive care.  #Severe protein calorie malnutrition: Dietary consult appreciated. Evaluated  by swallow recommended dysphagia level I. Continue to monitor closely.  Principal Problem:   Dehydration with hypernatremia Active Problems:   Protein-calorie malnutrition, moderate ([facility])   Mixed Alzheimer's and vascular dementia   Essential hypertension   Metabolic encephalopathy   Chronic diastolic heart failure, NYHA class 1 ([facility])   Acute hypernatremia  DVT prophylaxis: Heparin subcutaneous Code Status: DO NOT RESUSCITATE Family Communication: No family present at bedside Disposition Plan: Likely discharge to SNF possibly with hospice in 1-2 days. Waiting for palliative care and medical improvement.    Consultants:   Palliative care consult  Procedures: CT head Antimicrobials: Ceftriaxone 2/14.. Subjective: Patient was seen and examined at bedside. Minimally responsive. Unable to obtain review of system.  Objective: Vitals:   05/29/16 1658 05/29/16 2042 05/30/16 0511 05/30/16 1020  BP: 118/76 (!) 118/54 (!) 101/54 125/84  Pulse: 80 85 87 76  Resp: 18 12 12 14   Temp: 98.2 F (36.8 C) 99.6 F (37.6 C) 100 F (37.8 C) 98.5 F (36.9 C)  TempSrc: Oral Axillary  Oral  SpO2: 98% 100% 100% 97%  Weight:   36.3 kg (80 lb)   Height:        Intake/Output Summary (Last 24 hours) at 05/30/16 1419 Last data filed at 05/30/16 0900  Gross per 24 hour  Intake              240 ml  Output                0 ml  Net              240 ml   Filed Weights   05/28/16 1341 05/28/16 2057 05/30/16 0511  Weight: 36.4 kg (80 lb 4.8 oz) 36 kg (79 lb 5.9 oz) 36.3 kg (80 lb)    Examination:  General exam: Ill-looking,  elderly frail cachectic female lying on bed  Mouth: Dry mucous membrane Respiratory system: Clear to auscultation. Respiratory effort normal.  Cardiovascular system: S1 & S2 heard, RRR.  No pedal edema. Gastrointestinal system: Abdomen is nondistended, soft and nontender. Normal bowel sounds heard. Central nervous system: Minimally responsive. Extremities: Unable to  assess as patient is not able to follow commands. Skin: No rashes, lesions or ulcers Psychiatry: Unable to assess due to patient's poor mental status    Data Reviewed: I have personally reviewed following labs and imaging studies  CBC:  Recent Labs Lab 05/28/16 1040 05/29/16 0617 05/30/16 0445  WBC 8.9 13.8* 10.7*  NEUTROABS  --   --  7.5  HGB 14.2 13.2 11.8*  HCT 46.9* 43.7 37.7  MCV 97.9 96.7 93.5  PLT 132* 135* 107*   Basic Metabolic Panel:  Recent Labs Lab 05/25/16 0001 05/28/16 1040 05/28/16 1833 05/29/16 0617 05/30/16 0445  NA 165* 167* 162* 155* 144  K 3.7 3.8 3.9 3.3* 2.7*  CL 128* >130* >130* 125* 112*  CO2 25 25 24  20* 21*  GLUCOSE 92 93 124* 121* 116*  BUN 53* 59* 54* 50* 35*  CREATININE 1.17* 1.51* 1.42* 1.31* 1.12*  CALCIUM 9.0 9.3 8.7* 8.3* 7.8*  MG  --   --   --   --  1.9   GFR: Estimated Creatinine Clearance: 21 mL/min (by C-G formula based on SCr of 1.12 mg/dL (H)). Liver Function Tests:  Recent Labs Lab 05/25/16 0001 05/28/16 1040 05/29/16 0617 05/30/16 0445  AST 45* 74* 37 35  ALT 47* 66* 41 33  ALKPHOS 85 78 74 69  BILITOT 0.5 0.7 0.9 0.7  PROT 7.5 7.2 6.4* 5.9*  ALBUMIN 4.0 3.7 3.1* 2.8*   No results for input(s): LIPASE, AMYLASE in the last 168 hours. No results for input(s): AMMONIA in the last 168 hours. Coagulation Profile: No results for input(s): INR, PROTIME in the last 168 hours. Cardiac Enzymes: No results for input(s): CKTOTAL, CKMB, CKMBINDEX, TROPONINI in the last 168 hours. BNP (last 3 results) No results for input(s): PROBNP in the last 8760 hours. HbA1C: No results for input(s): HGBA1C in the last 72 hours. CBG: No results for input(s): GLUCAP in the last 168 hours. Lipid Profile: No results for input(s): CHOL, HDL, LDLCALC, TRIG, CHOLHDL, LDLDIRECT in the last 72 hours. Thyroid Function Tests: No results for input(s): TSH, T4TOTAL, FREET4, T3FREE, THYROIDAB in the last 72 hours. Anemia Panel: No results  for input(s): VITAMINB12, FOLATE, FERRITIN, TIBC, IRON, RETICCTPCT in the last 72 hours. Sepsis Labs:  Recent Labs Lab 05/28/16 1100 05/28/16 1314 05/29/16 0617  LATICACIDVEN 2.05* 2.11* 2.4*    Recent Results (from the past 240 hour(s))  Culture, Urine     Status: Abnormal   Collection Time: 05/28/16  4:47 PM  Result Value Ref Range Status   Specimen Description URINE, CATHETERIZED  Final   Special Requests NONE  Final   Culture MULTIPLE SPECIES PRESENT, SUGGEST RECOLLECTION (A)  Final   Report Status 05/29/2016 FINAL  Final         Radiology Studies: Dg Chest Port 1 View  Result Date: 05/30/2016 CLINICAL DATA:  Leukocytosis.  History of lung mass. EXAM: PORTABLE CHEST 1 VIEW COMPARISON:  05/28/2016 FINDINGS: Postsurgical changes in the right chest with surgical clips in the right lung. Heart size remains mildly enlarged but stable. No new airspace disease or pulmonary edema. No acute bone abnormality. IMPRESSION: Stable mild cardiomegaly. No acute chest findings. Electronically Signed  By: Richarda Overlie M.D.   On: 05/30/2016 08:00        Scheduled Meds: . cefTRIAXone (ROCEPHIN)  IV  1 g Intravenous Q24H  . dorzolamide  2 drop Right Eye BID  . famotidine (PEPCID) IV  20 mg Intravenous Daily  . heparin subcutaneous  5,000 Units Subcutaneous Q8H  . latanoprost  1 drop Both Eyes QHS   Continuous Infusions: . dextrose 5 % and 0.9 % NaCl with KCl 40 mEq/L 75 mL/hr at 05/30/16 1051     LOS: 2 days    Stacey Fernandez Jaynie Collins, MD [facility] Pager [phone]  If 7PM-7AM, please contact night-coverage [email or web address] Password [facility] 05/30/2016, 2:19 PM

## 2016-05-31 DIAGNOSIS — Z515 Encounter for palliative care: Secondary | ICD-10-CM

## 2016-05-31 LAB — BASIC METABOLIC PANEL
ANION GAP: 6 (ref 5–15)
BUN: 23 mg/dL — ABNORMAL HIGH (ref 6–20)
CHLORIDE: 122 mmol/L — AB (ref 101–111)
CO2: 21 mmol/L — ABNORMAL LOW (ref 22–32)
Calcium: 8.3 mg/dL — ABNORMAL LOW (ref 8.9–10.3)
Creatinine, Ser: 0.96 mg/dL (ref 0.44–1.00)
GFR, EST NON AFRICAN AMERICAN: 52 mL/min — AB (ref >60)
Glucose, Bld: 85 mg/dL (ref 65–99)
POTASSIUM: 4.4 mmol/L (ref 3.5–5.1)
SODIUM: 149 mmol/L — AB (ref 135–145)

## 2016-05-31 LAB — NA AND K (SODIUM & POTASSIUM), RAND UR
Potassium Urine: 56 mmol/L
Sodium, Ur: 102 mmol/L

## 2016-05-31 LAB — CBC
HEMATOCRIT: 40.8 % (ref 36.0–46.0)
HEMOGLOBIN: 12.8 g/dL (ref 12.0–15.0)
MCH: 29.4 pg (ref 26.0–34.0)
MCHC: 31.4 g/dL (ref 30.0–36.0)
MCV: 93.8 fL (ref 78.0–100.0)
Platelets: 64 10*3/uL — ABNORMAL LOW (ref 150–400)
RBC: 4.35 MIL/uL (ref 3.87–5.11)
RDW: 14.8 % (ref 11.5–15.5)
WBC: 11.3 10*3/uL — AB (ref 4.0–10.5)

## 2016-05-31 LAB — MAGNESIUM: Magnesium: 2.2 mg/dL (ref 1.7–2.4)

## 2016-05-31 LAB — OSMOLALITY, URINE: OSMOLALITY UR: 532 mosm/kg (ref 300–900)

## 2016-05-31 MED ORDER — POTASSIUM CL IN DEXTROSE 5% 20 MEQ/L IV SOLN
20.0000 meq | INTRAVENOUS | Status: DC
  Administered 2016-05-31 – 2016-06-01 (×2): 20 meq via INTRAVENOUS
  Filled 2016-05-31 (×4): qty 1000

## 2016-05-31 NOTE — Progress Notes (Signed)
Pt unable to follow commands and is nonverbal- pt unable to participate  with neuro checks. . MD Carolin Sicks was notified and is aware.   Paulla Fore, RN

## 2016-05-31 NOTE — Evaluation (Signed)
Physical Therapy Evaluation Patient Details Name: Stacey Fernandez MRN: [id number] DOB: 11-Oct-1929 Today's Date: 05/31/2016   History of Present Illness  Patient is an 83 yo female admitted 05/28/16 with decreased mental status, weakness, hypernatremia, acute on chronic kidney disease, and dehydration with ? UTI.     PMH:  End-stage dementia, CHF, CKD, HTN, HLD  Clinical Impression  Patient admitted with problems listed above - end-stage dementia now with decrease in mental status.  Patient unable to follow commands, and is minimally responsive, opening eyes to name for short period.  Patient unable to participate with skilled physical therapy.  Recommend lift equipment for out of bed to chair movement.  No further acute PT needs - PT will sign off.    Follow Up Recommendations No PT follow up;Supervision/Assistance - 24 hour (Recommend lift equipment for OOB at home.)    Equipment Recommendations  Other (comment) (Lift equipment)    Recommendations for Other Services       Precautions / Restrictions Precautions Precautions: Fall Restrictions Weight Bearing Restrictions: No      Mobility  Bed Mobility Overal bed mobility: Needs Assistance Bed Mobility: Rolling Rolling: Total assist         General bed mobility comments: Patient requires total assist to roll.  Does not attempt to assist.  Transfers                 General transfer comment: NT.  Need to use lift equipment  Ambulation/Gait             General Gait Details: NT  Stairs            Wheelchair Mobility    Modified Rankin (Stroke Patients Only)       Balance                                             Pertinent Vitals/Pain Pain Assessment: Faces Faces Pain Scale: No hurt    Home Living Family/patient expects to be discharged to:: Private residence Living Arrangements: Other relatives Available Help at Discharge: Family;Personal care attendant;Available 24  hours/day Type of Home: House Home Access: Stairs to enter Entrance Stairs-Rails: None Entrance Stairs-Number of Steps: 1 Home Layout: One level Home Equipment: Bedside commode;Walker - 2 wheels;Hand held shower head Additional Comments: 2 caregivers daily, lives with brother.   Patient unable to provide information.  Obtained from medical record.    Prior Function Level of Independence: Needs assistance   Gait / Transfers Assistance Needed: Per chart, patient was ambulating with hand held assist short distances.  ADL's / Homemaking Assistance Needed: Assist for ADL's from caregivers  Comments: Information from medical record.     Hand Dominance        Extremity/Trunk Assessment   Upper Extremity Assessment Upper Extremity Assessment: Difficult to assess due to impaired cognition (Maintained in flexed position. No active movement noted)    Lower Extremity Assessment Lower Extremity Assessment: Difficult to assess due to impaired cognition (No active movement noted.)       Communication   Communication: Expressive difficulties (Severe dementia)  Cognition Arousal/Alertness: Lethargic (Minimal responsiveness - opens eyes briefly to name) Behavior During Therapy: Flat affect Overall Cognitive Status: No family/caregiver present to determine baseline cognitive functioning                 General Comments: Patient unable to state her  name, or respond to questions.  Opens eyes briefly to name.  No movement noted in UE/LE's with tactile and verbal cues.    General Comments      Exercises     Assessment/Plan    PT Assessment Patent does not need any further PT services  PT Problem List            PT Treatment Interventions      PT Goals (Current goals can be found in the Care Plan section)  Acute Rehab PT Goals PT Goal Formulation: All assessment and education complete, DC therapy    Frequency     Barriers to discharge        Co-evaluation                End of Session   Activity Tolerance: Patient limited by lethargy Patient left: in bed;with call bell/phone within reach;with bed alarm set Nurse Communication: Mobility status;Need for lift equipment         Time: 1798-1025 PT Time Calculation (min) (ACUTE ONLY): 10 min   Charges:   PT Evaluation $PT Eval Low Complexity: 1 Procedure     PT G Codes:        Despina Pole 2016/06/28, 8:03 PM Carita Pian. Sanjuana Kava, [place] Pager [phone]

## 2016-05-31 NOTE — Progress Notes (Signed)
Daily Progress Note   Patient Name: Stacey Fernandez       Date: 05/31/2016 DOB: January 10, 1930  Age: 83 y.o. MRN#: [id number] Attending Physician: Rosita Fire, MD Primary Care Physician: Gildardo Cranker, DO Admit Date: 05/28/2016  Reason for Consultation/Follow-up: Non pain symptom management and Psychosocial/spiritual support  Subjective: Stacey Fernandez is unchanged from yesterday. She appears comfortable in bed with no evident uncontrolled symptoms. She continues to open her eyes to voice, and will briefly track me before falling back asleep.   Length of Stay: 3  Current Medications: Scheduled Meds:  . cefTRIAXone (ROCEPHIN)  IV  1 g Intravenous Q24H  . dorzolamide  2 drop Right Eye BID  . famotidine (PEPCID) IV  20 mg Intravenous Daily  . feeding supplement (ENSURE ENLIVE)  237 mL Oral BID BM  . heparin subcutaneous  5,000 Units Subcutaneous Q8H  . latanoprost  1 drop Both Eyes QHS    Continuous Infusions: . dextrose 5 % with KCl 20 mEq / L 20 mEq (05/31/16 1134)    PRN Meds: acetaminophen **OR** acetaminophen, ondansetron **OR** ondansetron (ZOFRAN) IV, sodium chloride  Physical Exam         Constitutional: Vital signs are normal. She appears lethargic. She has a sickly appearance.  HENT:  Head: Normocephalic and atraumatic.  Mouth/Throat: Mucous membranes are dry. No oropharyngeal exudate.  Eyes: EOM are normal.  Neck: Normal range of motion.  Cardiovascular: Normal rate.   Pulmonary/Chest: Effort normal.  Abdominal: Soft.  Musculoskeletal: She exhibits no edema.  Neurological: She appears lethargic.  Lethargic. Opens eyes to voice and tracks briefly. Skin: Skin is warm and dry. There is pallor.  Psychiatric:  Calm resting in bed   Vital Signs: BP 124/87 (BP Location: Left Arm)   Pulse 88   Temp 98.6 F (37 C)  (Oral)   Resp 20   Ht 5' (1.524 m)   Wt 35.9 kg (79 lb 2.3 oz)   SpO2 100%   BMI 15.46 kg/m  SpO2: SpO2: 100 % O2 Device: O2 Device: Nasal Cannula O2 Flow Rate: O2 Flow Rate (L/min): 1 L/min  Intake/output summary:  Intake/Output Summary (Last 24 hours) at 05/31/16 1434 Last data filed at 05/31/16 1132  Gross per 24 hour  Intake          2051.25 ml  Output                0 ml  Net          2051.25 ml   LBM: Last BM Date: 05/31/16 Baseline Weight: Weight: 36.4 kg (80 lb 4.8 oz) Most recent weight: Weight: 35.9 kg (79 lb 2.3 oz)   Palliative Assessment/Data: PPS 50-60%, based on family report of ability PTA.   Flowsheet Rows   Flowsheet Row Most Recent Value  Intake Tab  Referral Department  Hospitalist  Unit at Time of Referral  Med/Surg Unit  Palliative Care Primary Diagnosis  Neurology  Date Notified  05/28/16  Palliative Care Type  Return patient Palliative Care  Reason for referral  Clarify Goals of Care  Date of Admission  05/28/16  # of days IP prior to Palliative referral  0  Clinical Assessment  Psychosocial &  Spiritual Assessment  Palliative Care Outcomes      Patient Active Problem List   Diagnosis Date Noted  . Acute cystitis without hematuria   . Goals of care, counseling/discussion   . Acute hypernatremia   . Palliative care encounter   . Protein-calorie malnutrition, severe 09/14/2015  . CKD (chronic kidney disease), stage II 09/13/2015  . AKI (acute kidney injury) ([place]) 09/13/2015  . Dehydration with hypernatremia 09/13/2015  . Metabolic encephalopathy 49/70/2637  . Chronic diastolic heart failure, NYHA class 1 ([place]) 09/13/2015  . Diarrhea 09/13/2015  . Altered mental status   . Decubitus ulcer of sacral region, stage 4 ([place]) 09/06/2015  . Mixed Alzheimer's and vascular dementia 12/01/2013  . Essential hypertension 12/01/2013  . Nail hypertrophy 12/01/2013  . Protein-calorie malnutrition, moderate ([place]) 09/08/2013  . Functional constipation  09/08/2013  . Edema 07/28/2013  . Allergic rhinitis 07/28/2013  . Dyspnea on exertion 02/04/2013  . Impaired renal function 09/23/2012  . Urinary frequency 07/29/2012  . Malignant neoplasm of bronchus and lung, unspecified site 07/29/2012  . Anxiety state, unspecified 07/29/2012  . Primary pulmonary hypertension ([place]) 07/29/2012  . Chronic airway obstruction, not elsewhere classified 07/29/2012  . Other malaise and fatigue 07/29/2012  . Hyperlipidemia     Palliative Care Assessment & Plan   HPI: 83 y.o. female  with past medical history of end-stag dementia, severe protein-calorie malnutrition, dCHF, CKD stage 3, HTN, HLD, and functional constipation. She lives at home with full time caregivers. She was brought to her PCP after homecare noted change in her mental status, weakness, and strong smelling urine. Labwork done by PCP showed Na 165, wherein she was brought to the ED and was subsequently admitted on 05/28/2016 with dehydration. With IV fluids her mentation and lab work improved.  Assessment: I had a family meeting with Stacey Fernandez's family on 2/14. In that meeting they were realistic and well informed about dementia, and understood the normal progression of the disease. While dehydration, related to poor intake, can be a sign of decline, they still feel that her time at the hospital enables her meaningful time at home. I brought up that at some point it might shift when the benefit from the hospitalization is less compared with the burden, at which point considering a shift in focus towards only comfort would be appropriate. They agreed with that plan, but feel she is not yet at that point. They are focused on treating what is treatable and discharging home with Palliative support.   Today, Stacey Fernandez is unchanged from yesterday. She appears comfortable in bed with no uncontrolled symptom burden. Her family has my contact information, and have not called me with any questions or concerns.    Recommendations/Plan:  DNR, otherwise full scope interventions  Once medically ready, plan for d/c home with Palliative Care  PT ordered, per family request (they want to encourage mobilization and being OOB)  **There are clear goals of care in place and Stacey Fernandez has no uncontrolled symptoms. Palliative will follow peripherally at this point. Please call team phone [phone] for acute concerns or questions.   Goals of Care and Additional Recommendations:  Limitations on Scope of Treatment: Full Scope Treatment  Code Status:  DNR  Prognosis:   Unable to determine  Discharge Planning:  Home with Home Health and palliative care  Thank you for allowing the Palliative Medicine Team to assist in the care of this patient.  Total time: 15 minutes    Greater than 50%  of this time was  spent counseling and coordinating care related to the above assessment and plan.  Charlynn Court, NP Palliative Medicine Team [phone] pager (7a-5p) Team Phone # [phone]

## 2016-05-31 NOTE — Care Management Note (Addendum)
Case Management Note  Patient Details  Name: Stacey Fernandez MRN: [id number] Date of Birth: 1929-08-28  Subjective/Objective:      CM following for progression and d/c planning.               Action/Plan: 05/31/2016 Spoke with guardian, sister, Stacey Fernandez who asked this CM to speak with pt brother, Stacey Fernandez re [facility] needs and d/c planning. This CM spoke with Stacey Fernandez who states that he would like to have [facility] services for this pt and asked this  CM to assist with selecting a [facility] provider. [facility] was contacted for [facility] services. Unlikely that this pt will be able to participate with PT or OT services. Stacey Fern[facility] for Palliative services. This CM notified [facility] and  [facility] [place] of pt needs.  Stacey Fernandez states that the pt has no further DME needs and he will contact pt current aides to continue 24hr care in the home.  06/01/2016 [facility] unable to provide services, [facility] will provide [facility] services.   Expected Discharge Date:       06/01/2016           Expected Discharge Plan:  [place]  In-House Referral:  NA  Discharge planning Services  CM Consult  Post Acute Care Choice:  Home Health (Palliative Care) Choice offered to:  Adult Children  DME Arranged:  N/A DME Agency:  NA  HH Arranged:   RN, Speech, Social work and Engineer, production.  [place] Agency:  [facility]  Status of Service:  Complete.  If discussed at [place] of Stay Meetings, dates discussed:    Additional Comments:  Adron Bene, RN 05/31/2016, 4:31 PM

## 2016-05-31 NOTE — Progress Notes (Addendum)
PROGRESS NOTE    Stacey Fernandez  UEA:[id number] DOB: Dec 17, 1929 DOA: 05/28/2016 PCP: Kirt Boys, DO   Brief Narrative: 83 year old female history of severe end-stage dementia, severe protein malnutrition, hypertension, chronic diastolic heart failure, chronic kidney disease stage III, functional constipation seen at PCPs office on 05/25/2016 of the homecare noticed strong urine smell and change in mental status and inability to ambulate as per baseline. Labs obtained at PCPs office showed a sodium of 165 and patient was subsequently brought to the emergency room. Patient looks clinically dehydrated.  Assessment & Plan:  # Hypernatremia due to dehydration: Serum sodium level improving with IV fluid. Pending urine osmolality. Serum sodium level 149 today. I will change IV fluid with dextrose and potassium chloride. Repeat lab in the morning. Continue to encourage oral intake.  #Severe dementia with change in mental status, unknown if she has acute metabolic encephalopathy due to dehydration and possibly UTI:  -Continue IV fluid, antibiotics, supportive care -Patient with end-stage dementia, palliative care consult appreciated. Patient is DO NOT RESUSCITATE. Plan to discharge home with palliative care outpatient on discharge. Discussed with the case manager and social worker to follow-up on discharge planning.  #Acute on chronic kidney disease stage 3: due to dehydration: Serum creatinine level improved. Monitor BMP. Avoid nephrotoxin.  #Possible acute cystitis without hematuria: Patient with elevated lactic acid level on admission, leukocytosis and  low-grade temperature. Urine culture with multiple organisms growing. Continue IV ceftriaxone, day 2/3. Continue monitor clinically and monitor fever curve.  # Decubiti ulcer of sacrum, status 2 pressure injury: Wound care consult appreciated. Continue supportive care.  #Severe protein calorie malnutrition: Dietary consult appreciated.  Evaluated by swallow recommended dysphagia level I. Continue to monitor closely. As per nurse, patient is apparently able to swallow dysphagia diet. She might have fluctuation in her mental status consistent with delirium.  #Thrombocytopenia: Discontinue heparin.  SCD for DVT prophylaxis. Repeat CBC in the morning  Principal Problem:   Dehydration with hypernatremia Active Problems:   Protein-calorie malnutrition, moderate ([facility])   Mixed Alzheimer's and vascular dementia   Essential hypertension   Metabolic encephalopathy   Chronic diastolic heart failure, NYHA class 1 ([facility])   Acute hypernatremia  DVT prophylaxis: SCDs. Code Status: DO NOT RESUSCITATE Family Communication: No family present at bedside Disposition Plan: Likely discharge to home with home care and palliative care as per palliative care note. Discussed with the social worker and case manager to follow-up and discharge planning.  Consultants:   Palliative care consult  Procedures: CT head Antimicrobials: Ceftriaxone 2/14.. Subjective: Patient was seen and examined at bedside. Pt was more alert today. ROS limited  Objective: Vitals:   05/30/16 1020 05/30/16 2049 05/31/16 0421 05/31/16 0953  BP: 125/84 111/67 118/63 124/87  Pulse: 76 86 82 88  Resp: 14 18 19 20   Temp: 98.5 F (36.9 C) 98.6 F (37 C) 98.7 F (37.1 C) 98.6 F (37 C)  TempSrc: Oral Oral Oral Oral  SpO2: 97% 99% 100% 100%  Weight:  35.9 kg (79 lb 2.3 oz)    Height:        Intake/Output Summary (Last 24 hours) at 05/31/16 1147 Last data filed at 05/31/16 1104  Gross per 24 hour  Intake          2229.25 ml  Output                0 ml  Net          2229.25 ml   [employer]  05/28/16 2057 05/30/16 0511 05/30/16 2049  Weight: 36 kg (79 lb 5.9 oz) 36.3 kg (80 lb) 35.9 kg (79 lb 2.3 oz)    Examination:  General exam: Ill-looking, elderly frail cachectic female lying on bed, More alert today. Mouth: Moist mucous membrane Respiratory  system: Clear to auscultation. Respiratory effort normal.  Cardiovascular system: S1 & S2 heard, RRR.  No pedal edema. Gastrointestinal system: Abdomen is nondistended, soft and nontender. Normal bowel sounds heard. Central nervous system: still hyporesponsiveness but more alert than yesterday. Extremities: Unable to assess as patient is not able to follow commands. Skin: No rashes, lesions or ulcers Psychiatry: Unable to assess due to patient's poor mental status    Data Reviewed: I have personally reviewed following labs and imaging studies  CBC:  Recent Labs Lab 05/28/16 1040 05/29/16 0617 05/30/16 0445 05/31/16 0608  WBC 8.9 13.8* 10.7* 11.3*  NEUTROABS  --   --  7.5  --   HGB 14.2 13.2 11.8* 12.8  HCT 46.9* 43.7 37.7 40.8  MCV 97.9 96.7 93.5 93.8  PLT 132* 135* 107* 64*   Basic Metabolic Panel:  Recent Labs Lab 05/28/16 1833 05/29/16 0617 05/30/16 0445 05/30/16 1436 05/31/16 0608  NA 162* 155* 144 146* 149*  K 3.9 3.3* 2.7* 3.2* 4.4  CL >130* 125* 112* 115* 122*  CO2 24 20* 21* 22 21*  GLUCOSE 124* 121* 116* 89 85  BUN 54* 50* 35* 30* 23*  CREATININE 1.42* 1.31* 1.12* 1.04* 0.96  CALCIUM 8.7* 8.3* 7.8* 8.1* 8.3*  MG  --   --  1.9  --  2.2   GFR: Estimated Creatinine Clearance: 24.3 mL/min (by C-G formula based on SCr of 0.96 mg/dL). Liver Function Tests:  Recent Labs Lab 05/25/16 0001 05/28/16 1040 05/29/16 0617 05/30/16 0445  AST 45* 74* 37 35  ALT 47* 66* 41 33  ALKPHOS 85 78 74 69  BILITOT 0.5 0.7 0.9 0.7  PROT 7.5 7.2 6.4* 5.9*  ALBUMIN 4.0 3.7 3.1* 2.8*   No results for input(s): LIPASE, AMYLASE in the last 168 hours. No results for input(s): AMMONIA in the last 168 hours. Coagulation Profile: No results for input(s): INR, PROTIME in the last 168 hours. Cardiac Enzymes: No results for input(s): CKTOTAL, CKMB, CKMBINDEX, TROPONINI in the last 168 hours. BNP (last 3 results) No results for input(s): PROBNP in the last 8760  hours. HbA1C: No results for input(s): HGBA1C in the last 72 hours. CBG: No results for input(s): GLUCAP in the last 168 hours. Lipid Profile: No results for input(s): CHOL, HDL, LDLCALC, TRIG, CHOLHDL, LDLDIRECT in the last 72 hours. Thyroid Function Tests: No results for input(s): TSH, T4TOTAL, FREET4, T3FREE, THYROIDAB in the last 72 hours. Anemia Panel: No results for input(s): VITAMINB12, FOLATE, FERRITIN, TIBC, IRON, RETICCTPCT in the last 72 hours. Sepsis Labs:  Recent Labs Lab 05/28/16 1100 05/28/16 1314 05/29/16 0617  LATICACIDVEN 2.05* 2.11* 2.4*    Recent Results (from the past 240 hour(s))  Culture, Urine     Status: Abnormal   Collection Time: 05/28/16  4:47 PM  Result Value Ref Range Status   Specimen Description URINE, CATHETERIZED  Final   Special Requests NONE  Final   Culture MULTIPLE SPECIES PRESENT, SUGGEST RECOLLECTION (A)  Final   Report Status 05/29/2016 FINAL  Final         Radiology Studies: Dg Chest Port 1 View  Result Date: 05/30/2016 CLINICAL DATA:  Leukocytosis.  History of lung mass. EXAM: PORTABLE CHEST 1 VIEW COMPARISON:  05/28/2016 FINDINGS: Postsurgical changes in the right chest with surgical clips in the right lung. Heart size remains mildly enlarged but stable. No new airspace disease or pulmonary edema. No acute bone abnormality. IMPRESSION: Stable mild cardiomegaly. No acute chest findings. Electronically Signed   By: Richarda Overlie M.D.   On: 05/30/2016 08:00        Scheduled Meds: . cefTRIAXone (ROCEPHIN)  IV  1 g Intravenous Q24H  . dorzolamide  2 drop Right Eye BID  . famotidine (PEPCID) IV  20 mg Intravenous Daily  . feeding supplement (ENSURE ENLIVE)  237 mL Oral BID BM  . heparin subcutaneous  5,000 Units Subcutaneous Q8H  . latanoprost  1 drop Both Eyes QHS  . potassium chloride  20 mEq Oral BID   Continuous Infusions: . dextrose 5 % with KCl 20 mEq / L 20 mEq (05/31/16 1134)     LOS: 3 days    Madelyne Millikan Jaynie Collins, MD [facility] Pager [phone]  If 7PM-7AM, please contact night-coverage [email or web address] Password [facility] 05/31/2016, 11:47 AM

## 2016-06-01 LAB — BASIC METABOLIC PANEL
ANION GAP: 10 (ref 5–15)
BUN: 18 mg/dL (ref 6–20)
CALCIUM: 8.1 mg/dL — AB (ref 8.9–10.3)
CO2: 17 mmol/L — ABNORMAL LOW (ref 22–32)
Chloride: 118 mmol/L — ABNORMAL HIGH (ref 101–111)
Creatinine, Ser: 0.89 mg/dL (ref 0.44–1.00)
GFR, EST NON AFRICAN AMERICAN: 57 mL/min — AB (ref >60)
Glucose, Bld: 94 mg/dL (ref 65–99)
POTASSIUM: 4.5 mmol/L (ref 3.5–5.1)
Sodium: 145 mmol/L (ref 135–145)

## 2016-06-01 LAB — CBC
HCT: 36.6 % (ref 36.0–46.0)
Hemoglobin: 11.1 g/dL — ABNORMAL LOW (ref 12.0–15.0)
MCH: 28.9 pg (ref 26.0–34.0)
MCHC: 30.3 g/dL (ref 30.0–36.0)
MCV: 95.3 fL (ref 78.0–100.0)
PLATELETS: 103 10*3/uL — AB (ref 150–400)
RBC: 3.84 MIL/uL — ABNORMAL LOW (ref 3.87–5.11)
RDW: 15.1 % (ref 11.5–15.5)
WBC: 8.9 10*3/uL (ref 4.0–10.5)

## 2016-06-01 MED ORDER — FAMOTIDINE 20 MG PO TABS
20.0000 mg | ORAL_TABLET | Freq: Every day | ORAL | Status: DC
  Administered 2016-06-01: 20 mg via ORAL
  Filled 2016-06-01: qty 1

## 2016-06-01 NOTE — Progress Notes (Signed)
SAY=97 ON 1L/[place]. NO OBVIOUS DISTRESS.

## 2016-06-01 NOTE — Care Management Important Message (Signed)
Important Message  Patient Details  Name: Stacey Fernandez MRN: [id number] Date of Birth: Nov 18, 1929   Medicare Important Message Given:  Yes    Ansley Stanwood 06/01/2016, 2:04 PM

## 2016-06-01 NOTE — Progress Notes (Signed)
O2 SAT 99 ON RA, NO ACUTE DISTRESS.

## 2016-06-01 NOTE — Discharge Summary (Signed)
Physician Discharge Summary  Stacey Fernandez RJJ:[id number] DOB: 1930-01-18 DOA: 05/28/2016  PCP: Kirt Boys, DO  Admit date: 05/28/2016 Discharge date: 06/01/2016  Admitted From:Home Disposition:Home with home services.  Recommendations for Outpatient Follow-up:  1. Follow up with PCP in 1-2 weeks 2. Please obtain BMP/CBC in one week   Home Health:yes Equipment/Devices:no Discharge Condition: stable CODE STATUS: DNR Diet recommendation: dysphagia level 1  Brief/Interim Summary:83 year old female history of severe end-stage dementia, severe protein malnutrition, hypertension, chronic diastolic heart failure, chronic kidney disease stage III, functional constipation seen at PCPs office on 05/25/2016 of the homecare noticed strong urine smell and change in mental status and inability to ambulate as per baseline. Labs obtained at PCPs office showed a sodium of 165 and patient was subsequently brought to the emergency room. Patient looked clinically dehydrated. # Hypernatremia due to dehydration: Serum sodium level improving with IV fluid. Pending urine osmolality. Serum sodium level 149 today. I will change IV fluid with dextrose and potassium chloride. Repeat lab in the morning. Continue to encourage oral intake.  #Severe dementia with change in mental status, unknown if she has acute metabolic encephalopathy due to dehydration and possibly UTI: No significant change in mental status likely this is her baseline. -Treated with IV fluid, antibiotics and supportive care. -Patient with end-stage dementia, palliative care consult appreciated. Patient is DO NOT RESUSCITATE. Plan to discharge home with palliative care outpatient on discharge. Discussed with the social worker and case manager regarding discharge plan. Patient will be followed up by palliative care and hospice care service on discharge.  #Acute on chronic kidney disease stage 3: due to dehydration: Serum creatinine level  improved.   #Possible acute cystitis without hematuria: Patient with elevated lactic acid level on admission, leukocytosis and  low-grade temperature. Urine culture with multiple organisms growing. Treated with 3 days of IV ceftriaxone. Clinically improved.  # Decubiti ulcer of sacrum, status 2 pressure injury: Wound care consult appreciated. Continue supportive care.  #Severe protein calorie malnutrition: Dietary consult appreciated. Evaluated by swallow recommended dysphagia level I. Continue to monitor closely. As per nurse, patient is apparently able to swallow dysphagia diet. She might have fluctuation in her mental status consistent with delirium.  #Thrombocytopenia: Platelet counts improving to 103. No sign of bleeding. Outpatient lab monitoring.  Elderly patient with severe dementia, with UTI and hypernatremia on admission. Treated with IV fluid with improvement in serum sodium level. Received IV antibiotics for UTI in the hospital. Evaluated by palliative care service, plan to discharge home with home care services and palliative care.  Discharge Diagnoses:  Principal Problem:   Dehydration with hypernatremia Active Problems:   Hyperlipidemia   Protein-calorie malnutrition, moderate (HCC)   Functional constipation   Mixed Alzheimer's and vascular dementia   Essential hypertension   Decubitus ulcer of sacral region, stage 4 (HCC)   CKD (chronic kidney disease), stage II   AKI (acute kidney injury) (HCC)   Metabolic encephalopathy   Chronic diastolic heart failure, NYHA class 1 (HCC)   Acute hypernatremia   Acute cystitis without hematuria   Goals of care, counseling/discussion   Palliative care by specialist    Discharge Instructions  Discharge Instructions    Amb Referral to Palliative Care    Complete by:  As directed    Call MD for:  temperature >100.4    Complete by:  As directed    Diet - low sodium heart healthy    Complete by:  As directed    Dysphagia  level 1  diet   Face-to-face encounter (required for Medicare/Medicaid patients)    Complete by:  As directed    I Kennedie Pardoe Jaynie Collins certify that this patient is under my care and that I, or a nurse practitioner or physician's assistant working with me, had a face-to-face encounter that meets the physician face-to-face encounter requirements with this patient on 06/01/2016. The encounter with the patient was in whole, or in part for the following medical condition(s) which is the primary reason for home health care (List medical condition): confusion   The encounter with the patient was in whole, or in part, for the following medical condition, which is the primary reason for home health care:  weakness, confusion   I certify that, based on my findings, the following services are medically necessary home health services:   Nursing Speech language pathology     Reason for Medically Necessary Home Health Services:  Skilled Nursing- Skilled Assessment/Observation   My clinical findings support the need for the above services:  Cognitive impairments, dementia, or mental confusion  that make it unsafe to leave home   Further, I certify that my clinical findings support that this patient is homebound due to:  Unable to leave home safely without assistance   Home Health    Complete by:  As directed    To provide the following care/treatments:   SLP RN Home Health Aide Social work     Increase activity slowly    Complete by:  As directed      Allergies as of 06/01/2016   No Known Allergies     Medication List    STOP taking these medications   furosemide 20 MG tablet Commonly known as:  LASIX     TAKE these medications   AMBULATORY NON FORMULARY MEDICATION 1. Roho Cushion Dx: [id number] 2. Transfer Tub Bench Dx: R53.81   aspirin EC 81 MG tablet Take 81 mg by mouth at bedtime.   atorvastatin 10 MG tablet Commonly known as:  LIPITOR TAKE 1 TABLET BY MOUTH EVERY DAY   latanoprost 0.005  % ophthalmic solution Commonly known as:  XALATAN PLACE 1 DROP INTO BOTH EYES AT BEDTIME.   mirtazapine 15 MG tablet Commonly known as:  REMERON Take 1 tablet (15 mg total) by mouth at bedtime.   NAMENDA XR 28 MG Cp24 24 hr capsule Generic drug:  memantine TAKE ONE CAPSULE BY MOUTH EVERY DAY   sodium chloride 0.65 % Soln nasal spray Commonly known as:  OCEAN Place 1 spray into both nostrils as needed for congestion.   TRUSOPT 2 % ophthalmic solution Generic drug:  dorzolamide Place 2 drops into the right eye 2 (two) times daily.      Follow-up Information    Kirt Boys, DO. Schedule an appointment as soon as possible for a visit in 1 week(s).   Specialty:  Internal Medicine Contact information: [address] [place] [address] [phone]          No Known Allergies  Consultations: Palliative care  Procedures/Studies: No  Subjective: Patient was seen and examined at bedside. Patient is alert and opens her eyes with name. Not following commands. Review of systems Limited.  Discharge Exam: Vitals:   06/01/16 0437 06/01/16 1028  BP: (!) 120/53 (!) 100/51  Pulse: (!) 49 85  Resp: 16 17  Temp: 98.7 F (37.1 C) 99.6 F (37.6 C)   Vitals:   05/31/16 2046 06/01/16 0437 06/01/16 0500 06/01/16 1028  BP: 127/78 (!) 120/53  Marland Kitchen)  100/51  Pulse: 85 (!) 49  85  Resp: 16 16  17   Temp: 98.6 F (37 C) 98.7 F (37.1 C)  99.6 F (37.6 C)  TempSrc:    Axillary  SpO2: 100% 100%  100%  Weight:   35.4 kg (78 lb)   Height:        General: Pt is more alert awake and opening eyes with name today. Not in distress Cardiovascular: RRR, S1/S2 +, no rubs, no gallops Respiratory: CTA bilaterally, no wheezing, no rhonchi Abdominal: Soft, NT, ND, bowel sounds + Extremities: no edema, no cyanosis    The results of significant diagnostics from this hospitalization (including imaging, microbiology, ancillary and laboratory) are listed below for reference.      Microbiology: Recent Results (from the past 240 hour(s))  Culture, Urine     Status: Abnormal   Collection Time: 05/28/16  4:47 PM  Result Value Ref Range Status   Specimen Description URINE, CATHETERIZED  Final   Special Requests NONE  Final   Culture MULTIPLE SPECIES PRESENT, SUGGEST RECOLLECTION (A)  Final   Report Status 05/29/2016 FINAL  Final     Labs: BNP (last 3 results) No results for input(s): BNP in the last 8760 hours. Basic Metabolic Panel:  Recent Labs Lab 05/29/16 0617 05/30/16 0445 05/30/16 1436 05/31/16 0608 06/01/16 0634  NA 155* 144 146* 149* 145  K 3.3* 2.7* 3.2* 4.4 4.5  CL 125* 112* 115* 122* 118*  CO2 20* 21* 22 21* 17*  GLUCOSE 121* 116* 89 85 94  BUN 50* 35* 30* 23* 18  CREATININE 1.31* 1.12* 1.04* 0.96 0.89  CALCIUM 8.3* 7.8* 8.1* 8.3* 8.1*  MG  --  1.9  --  2.2  --    Liver Function Tests:  Recent Labs Lab 05/28/16 1040 05/29/16 0617 05/30/16 0445  AST 74* 37 35  ALT 66* 41 33  ALKPHOS 78 74 69  BILITOT 0.7 0.9 0.7  PROT 7.2 6.4* 5.9*  ALBUMIN 3.7 3.1* 2.8*   No results for input(s): LIPASE, AMYLASE in the last 168 hours. No results for input(s): AMMONIA in the last 168 hours. CBC:  Recent Labs Lab 05/28/16 1040 05/29/16 0617 05/30/16 0445 05/31/16 0608 06/01/16 0634  WBC 8.9 13.8* 10.7* 11.3* 8.9  NEUTROABS  --   --  7.5  --   --   HGB 14.2 13.2 11.8* 12.8 11.1*  HCT 46.9* 43.7 37.7 40.8 36.6  MCV 97.9 96.7 93.5 93.8 95.3  PLT 132* 135* 107* 64* 103*   Cardiac Enzymes: No results for input(s): CKTOTAL, CKMB, CKMBINDEX, TROPONINI in the last 168 hours. BNP: Invalid input(s): POCBNP CBG: No results for input(s): GLUCAP in the last 168 hours. D-Dimer No results for input(s): DDIMER in the last 72 hours. Hgb A1c No results for input(s): HGBA1C in the last 72 hours. Lipid Profile No results for input(s): CHOL, HDL, LDLCALC, TRIG, CHOLHDL, LDLDIRECT in the last 72 hours. Thyroid function studies No results for  input(s): TSH, T4TOTAL, T3FREE, THYROIDAB in the last 72 hours.  Invalid input(s): FREET3 Anemia work up No results for input(s): VITAMINB12, FOLATE, FERRITIN, TIBC, IRON, RETICCTPCT in the last 72 hours. Urinalysis    Component Value Date/Time   COLORURINE YELLOW 05/28/2016 1647   APPEARANCEUR HAZY (A) 05/28/2016 1647   LABSPEC 1.011 05/28/2016 1647   PHURINE 6.0 05/28/2016 1647   GLUCOSEU NEGATIVE 05/28/2016 1647   HGBUR MODERATE (A) 05/28/2016 1647   BILIRUBINUR NEGATIVE 05/28/2016 1647   KETONESUR NEGATIVE 05/28/2016 1647  PROTEINUR NEGATIVE 05/28/2016 1647   UROBILINOGEN 1.0 10/31/2010 1241   NITRITE NEGATIVE 05/28/2016 1647   LEUKOCYTESUR NEGATIVE 05/28/2016 1647   Sepsis Labs Invalid input(s): PROCALCITONIN,  WBC,  LACTICIDVEN Microbiology Recent Results (from the past 240 hour(s))  Culture, Urine     Status: Abnormal   Collection Time: 05/28/16  4:47 PM  Result Value Ref Range Status   Specimen Description URINE, CATHETERIZED  Final   Special Requests NONE  Final   Culture MULTIPLE SPECIES PRESENT, SUGGEST RECOLLECTION (A)  Final   Report Status 05/29/2016 FINAL  Final     Time coordinating discharge: 28 minutes  SIGNED:   Maxie Barb, MD  [facility] 06/01/2016, 12:30 PM  If 7PM-7AM, please contact night-coverage [email or web address] Password [email or web address]

## 2016-06-01 NOTE — Progress Notes (Signed)
O2 to 1l/m per [place].

## 2016-06-04 DIAGNOSIS — I509 Heart failure, unspecified: Secondary | ICD-10-CM

## 2016-06-04 DIAGNOSIS — F419 Anxiety disorder, unspecified: Secondary | ICD-10-CM | POA: Diagnosis not present

## 2016-06-04 DIAGNOSIS — F0390 Unspecified dementia without behavioral disturbance: Secondary | ICD-10-CM

## 2016-06-04 DIAGNOSIS — J449 Chronic obstructive pulmonary disease, unspecified: Secondary | ICD-10-CM

## 2016-06-04 DIAGNOSIS — L89312 Pressure ulcer of right buttock, stage 2: Secondary | ICD-10-CM | POA: Diagnosis not present

## 2016-06-04 DIAGNOSIS — I130 Hypertensive heart and chronic kidney disease with heart failure and stage 1 through stage 4 chronic kidney disease, or unspecified chronic kidney disease: Secondary | ICD-10-CM

## 2016-06-04 DIAGNOSIS — E782 Mixed hyperlipidemia: Secondary | ICD-10-CM | POA: Diagnosis not present

## 2016-06-04 DIAGNOSIS — N189 Chronic kidney disease, unspecified: Secondary | ICD-10-CM | POA: Diagnosis not present

## 2016-06-06 ENCOUNTER — Ambulatory Visit (INDEPENDENT_AMBULATORY_CARE_PROVIDER_SITE_OTHER): Payer: Medicare Other | Admitting: Internal Medicine

## 2016-06-06 ENCOUNTER — Encounter: Payer: Self-pay | Admitting: Internal Medicine

## 2016-06-06 VITALS — BP 110/76 | HR 78 | Temp 97.3°F | Ht 60.0 in | Wt 88.4 lb

## 2016-06-06 DIAGNOSIS — G309 Alzheimer's disease, unspecified: Secondary | ICD-10-CM

## 2016-06-06 DIAGNOSIS — D649 Anemia, unspecified: Secondary | ICD-10-CM

## 2016-06-06 DIAGNOSIS — Z23 Encounter for immunization: Secondary | ICD-10-CM | POA: Diagnosis not present

## 2016-06-06 DIAGNOSIS — R627 Adult failure to thrive: Secondary | ICD-10-CM | POA: Diagnosis not present

## 2016-06-06 DIAGNOSIS — D696 Thrombocytopenia, unspecified: Secondary | ICD-10-CM | POA: Diagnosis not present

## 2016-06-06 DIAGNOSIS — E870 Hyperosmolality and hypernatremia: Secondary | ICD-10-CM | POA: Diagnosis not present

## 2016-06-06 DIAGNOSIS — F0280 Dementia in other diseases classified elsewhere without behavioral disturbance: Secondary | ICD-10-CM | POA: Diagnosis not present

## 2016-06-06 MED ORDER — ZOSTER VACCINE LIVE 19400 UNT/0.65ML ~~LOC~~ SUSR: 0.6500 mL | Freq: Once | SUBCUTANEOUS | 0 refills | Status: AC

## 2016-06-06 MED ORDER — TETANUS-DIPHTH-ACELL PERTUSSIS 5-2.5-18.5 LF-MCG/0.5 IM SUSP: 0.5000 mL | Freq: Once | INTRAMUSCULAR | 0 refills | Status: AC

## 2016-06-06 NOTE — Patient Instructions (Signed)
Continue current medications as ordered  Push fluids  Will call with lab results  Follow up as scheduled in June 2018

## 2016-06-06 NOTE — Progress Notes (Signed)
Patient ID: Stacey Fernandez, female   DOB: 05-26-1929, 83 y.o.   MRN: [id number]    Location:  [facility] Place of Service: OFFICE  CODE STATUS: DNR  Chief Complaint  Patient presents with  . Hospitalization Follow-up    Hospitalization follow up , Discuss Bone Density    HPI:  83 yo female seen today for hospital f/u 2/12th-2/16th for dehydration, hypernatremia, UTI, end stage dementia, moderate protein calorie malnutrition, thrombocytopenia, A/CKD. She was tx with 3 days IV rocephin for UTI. Wound care followed for stage 2 sacral ulcer. She was d/c'd with [facility] services. Elevated Na tx with IVF. Palliative care consulted and recommended that she cont with palliative care at d/c and possible hospice. ST saw pt and she was able to tolerate dysphagia 1 diet. plts improved without transfusion and she experienced no spontaneous or prolonged bleeding. Na 167-->145; K as low as 2.7-->4.5; Cr 1.51-->0.89; albumin 2.8; AST 74-->35; ALT 66-->33; Plts 132K-->64K-->103K; Hgb 11.1; WBC as high as 13.8K-->8.9K.   She reports feeling better since d/c. Family attempting to push fluids. She is a poor historian due to dementia, hx obtained from chart  Dementia/anxiety/FTT/protein calorie malnutrition - takes namenda xr and remeron. No behavior disturbances. Weight 91 lbs (up from 89 lbs). She refuses ensure/boost supplements. Albumin 3.7 in May 2017. Na improved from 147-->142 with increased fluids  Hyperlipidemia - stable on lipitor. LDL 84  COPD/hx lung cancer- released from oncology last year as she has not had recurrence of tumor.   HTN/edema - stable on 1/2 tab of lasix. Also takes ASA daily  Constipation - stable. BM q3days  Glaucoma - stable on eye gtts. followed by eye specialist  Past Medical History:  Diagnosis Date  . Anxiety state, unspecified   . Arthritis    "hands, feet" (09/13/2015)  . Chronic airway obstruction, not elsewhere classified   . Chronic kidney disease (CKD), stage II (mild)      [name] 09/13/2015  . COPD (chronic obstructive pulmonary disease) ([place])   . Dementia   . Functional constipation    [name] 09/13/2015  . Hemiplegia affecting dominant side, late effect of cerebrovascular disease   . Hyperlipidemia   . Lung cancer ([place])   . Lung mass   . Memory loss   . Mixed Alzheimer's and vascular dementia    /notes 09/13/2015  . Other malaise and fatigue   . Protein calorie malnutrition ([place])    [name] 09/13/2015  . Sacral decubitus ulcer    chronic; stage IV/notes 09/13/2015  . Senile dementia with delirium   . Urinary frequency     Past Surgical History:  Procedure Laterality Date  . CATARACT EXTRACTION, BILATERAL    . mediastinal lymp node dissection  01/10/2011   VAN TRIGT  . WEDGE RESECTION RUL NODULE  01/10/2011   VAN TRIGT    Patient Care Team: Gildardo Cranker, DO as PCP - General (Internal Medicine) Grant Fontana, MD (Inactive) (Internal Medicine) Ivin Poot, MD (Cardiothoracic Surgery)  Social History   Social History  . Marital status: Divorced    Spouse name: N/A  . Number of children: N/A  . Years of education: N/A   Occupational History  . Not on file.   Social History Main Topics  . Smoking status: Former Smoker    Packs/day: 0.50    Types: Cigarettes    Quit date: 06/09/2010  . Smokeless tobacco: Never Used  . Alcohol use No  . Drug use: No  . Sexual activity: No  Other Topics Concern  . Not on file   Social History Narrative  . No narrative on file     reports that she quit smoking about 5 years ago. Her smoking use included Cigarettes. She smoked 0.50 packs per day. She has never used smokeless tobacco. She reports that she does not drink alcohol or use drugs.  Family History  Problem Relation Age of Onset  . Heart disease Mother   . Cancer Father   . Heart disease Sister   . Heart disease Brother    Family Status  Relation Status  . Mother Deceased  . Father Deceased  . Sister Deceased  . Brother  Deceased  . Sister Alive   16  . Brother Alive   14  . Sister Alive   85  . Brother Alive   83  . Brother Alive   68  . Sister Deceased at age 67   blood clot  . Brother Alive   37  . Sister Deceased at age 91   blood clot   . Son Deceased   at birth     No Known Allergies  Medications: Patient's Medications  New Prescriptions   No medications on file  Previous Medications   AMBULATORY NON FORMULARY MEDICATION    1. Roho Cushion Dx: [id number] 2. Transfer Tub Bench Dx: R53.81   ASPIRIN EC 81 MG TABLET    Take 81 mg by mouth at bedtime.   ATORVASTATIN (LIPITOR) 10 MG TABLET    TAKE 1 TABLET BY MOUTH EVERY DAY   LATANOPROST (XALATAN) 0.005 % OPHTHALMIC SOLUTION    PLACE 1 DROP INTO BOTH EYES AT BEDTIME.   MIRTAZAPINE (REMERON) 15 MG TABLET    Take 1 tablet (15 mg total) by mouth at bedtime.   NAMENDA XR 28 MG CP24 24 HR CAPSULE    TAKE ONE CAPSULE BY MOUTH EVERY DAY   SODIUM CHLORIDE (OCEAN) 0.65 % SOLN NASAL SPRAY    Place 1 spray into both nostrils as needed for congestion.   TRUSOPT 2 % OPHTHALMIC SOLUTION    Place 2 drops into the right eye 2 (two) times daily.   Modified Medications   Modified Medication Previous Medication   TDAP (BOOSTRIX) 5-2.5-18.5 LF-MCG/0.5 INJECTION Tdap (BOOSTRIX) 5-2.5-18.5 LF-MCG/0.5 injection      Inject 0.5 mLs into the muscle once.    Inject 0.5 mLs into the muscle once.   ZOSTER VACCINE LIVE, PF, (ZOSTAVAX) 46803 UNT/0.65ML INJECTION Zoster Vaccine Live, PF, (ZOSTAVAX) 21224 UNT/0.65ML injection      Inject 19,400 Units into the skin once.    Inject 0.65 mLs into the skin once.  Discontinued Medications   No medications on file    Review of Systems  Unable to perform ROS: Dementia    Vitals:   06/06/16 1610  BP: 110/76  Pulse: 78  Temp: 97.3 F (36.3 C)  TempSrc: Oral  SpO2: 93%  Weight: 88 lb 6.4 oz (40.1 kg)  Height: 5' (1.524 m)   Body mass index is 17.26 kg/m.  Physical Exam  Constitutional: She appears  well-developed.  Frail appearing in NAD.   HENT:  Mouth/Throat: Oropharynx is clear and moist. No oropharyngeal exudate.  Eyes: Pupils are equal, round, and reactive to light. No scleral icterus.  Neck: Neck supple. Carotid bruit is not present. No tracheal deviation present. No thyromegaly present.  Cardiovascular: Normal rate, regular rhythm, normal heart sounds and intact distal pulses.  Exam reveals no gallop and no friction rub.  No murmur heard. No LE edema b/l. no calf TTP.   Pulmonary/Chest: Effort normal and breath sounds normal. No stridor. No respiratory distress. She has no wheezes. She has no rales.  Abdominal: Soft. Bowel sounds are normal. She exhibits no distension and no mass. There is no hepatomegaly. There is no tenderness. There is no rebound and no guarding.  Musculoskeletal: She exhibits edema and deformity (small joints).  Lymphadenopathy:    She has no cervical adenopathy.  Neurological: She is alert.  Skin: Skin is warm and dry. No rash noted.  Psychiatric: She has a normal mood and affect. Her speech is normal and behavior is normal.     Labs reviewed: Admission on 05/28/2016, Discharged on 06/01/2016  Component Date Value Ref Range Status  . Sodium 05/28/2016 167* 135 - 145 mmol/L Final   Comment: CRITICAL RESULT CALLED TO, READ BACK BY AND VERIFIED WITH: KOULA PATE,RN AT 1146 05/28/16 BY ZBEECH.   Marland Kitchen Potassium 05/28/2016 3.8  3.5 - 5.1 mmol/L Final  . Chloride 05/28/2016 >130* 101 - 111 mmol/L Final   Comment: CRITICAL RESULT CALLED TO, READ BACK BY AND VERIFIED WITH: REBECCA GREENWALD,RN AT 7867 05/28/16 BY ZBEECH.   . CO2 05/28/2016 25  22 - 32 mmol/L Final  . Glucose, Bld 05/28/2016 93  65 - 99 mg/dL Final  . BUN 05/28/2016 59* 6 - 20 mg/dL Final  . Creatinine, Ser 05/28/2016 1.51* 0.44 - 1.00 mg/dL Final  . Calcium 05/28/2016 9.3  8.9 - 10.3 mg/dL Final  . Total Protein 05/28/2016 7.2  6.5 - 8.1 g/dL Final  . Albumin 05/28/2016 3.7  3.5 - 5.0 g/dL  Final  . AST 05/28/2016 74* 15 - 41 U/L Final  . ALT 05/28/2016 66* 14 - 54 U/L Final  . Alkaline Phosphatase 05/28/2016 78  38 - 126 U/L Final  . Total Bilirubin 05/28/2016 0.7  0.3 - 1.2 mg/dL Final  . GFR calc non Af Amer 05/28/2016 30* >60 mL/min Final  . GFR calc Af Amer 05/28/2016 35* >60 mL/min Final   Comment: (NOTE) The eGFR has been calculated using the CKD EPI equation. This calculation has not been validated in all clinical situations. eGFR's persistently <60 mL/min signify possible Chronic Kidney Disease.   . Anion gap 05/28/2016 NOT CALCULATED  5 - 15 Final  . WBC 05/28/2016 8.9  4.0 - 10.5 K/uL Final  . RBC 05/28/2016 4.79  3.87 - 5.11 MIL/uL Final  . Hemoglobin 05/28/2016 14.2  12.0 - 15.0 g/dL Final  . HCT 05/28/2016 46.9* 36.0 - 46.0 % Final  . MCV 05/28/2016 97.9  78.0 - 100.0 fL Final  . MCH 05/28/2016 29.6  26.0 - 34.0 pg Final  . MCHC 05/28/2016 30.3  30.0 - 36.0 g/dL Final  . RDW 05/28/2016 15.7* 11.5 - 15.5 % Final  . Platelets 05/28/2016 132* 150 - 400 K/uL Final  . Lactic Acid, Venous 05/28/2016 2.05* 0.5 - 1.9 mmol/L Final  . Comment 05/28/2016 NOTIFIED PHYSICIAN   Final  . Color, Urine 05/28/2016 YELLOW  YELLOW Final  . APPearance 05/28/2016 HAZY* CLEAR Final  . Specific Gravity, Urine 05/28/2016 1.011  1.005 - 1.030 Final  . pH 05/28/2016 6.0  5.0 - 8.0 Final  . Glucose, UA 05/28/2016 NEGATIVE  NEGATIVE mg/dL Final  . Hgb urine dipstick 05/28/2016 MODERATE* NEGATIVE Final  . Bilirubin Urine 05/28/2016 NEGATIVE  NEGATIVE Final  . Ketones, ur 05/28/2016 NEGATIVE  NEGATIVE mg/dL Final  . Protein, ur 05/28/2016 NEGATIVE  NEGATIVE mg/dL Final  .  Nitrite 05/28/2016 NEGATIVE  NEGATIVE Final  . Leukocytes, UA 05/28/2016 NEGATIVE  NEGATIVE Final  . RBC / HPF 05/28/2016 0-5  0 - 5 RBC/hpf Final  . WBC, UA 05/28/2016 0-5  0 - 5 WBC/hpf Final  . Bacteria, UA 05/28/2016 MANY* NONE SEEN Final  . Squamous Epithelial / LPF 05/28/2016 NONE SEEN  NONE SEEN Final  .  Mucous 05/28/2016 PRESENT   Final  . Hyaline Casts, UA 05/28/2016 PRESENT   Final  . Lactic Acid, Venous 05/28/2016 2.11* 0.5 - 1.9 mmol/L Final  . Comment 05/28/2016 NOTIFIED PHYSICIAN   Final  . Specimen Description 05/28/2016 URINE, CATHETERIZED   Final  . Special Requests 05/28/2016 NONE   Final  . Culture 05/28/2016 MULTIPLE SPECIES PRESENT, SUGGEST RECOLLECTION*  Final  . Report Status 05/28/2016 05/29/2016 FINAL   Final  . WBC 05/29/2016 13.8* 4.0 - 10.5 K/uL Final  . RBC 05/29/2016 4.52  3.87 - 5.11 MIL/uL Final  . Hemoglobin 05/29/2016 13.2  12.0 - 15.0 g/dL Final  . HCT 05/29/2016 43.7  36.0 - 46.0 % Final  . MCV 05/29/2016 96.7  78.0 - 100.0 fL Final  . MCH 05/29/2016 29.2  26.0 - 34.0 pg Final  . MCHC 05/29/2016 30.2  30.0 - 36.0 g/dL Final  . RDW 05/29/2016 15.1  11.5 - 15.5 % Final  . Platelets 05/29/2016 135* 150 - 400 K/uL Final  . Lactic Acid, Venous 05/29/2016 2.4* 0.5 - 1.9 mmol/L Final   Comment: CRITICAL RESULT CALLED TO, READ BACK BY AND VERIFIED WITH: K.HOLDSON,RN 2/113/18 0800 BY BSLADE CORRECT DATE [id number]   . Sodium 05/29/2016 155* 135 - 145 mmol/L Final  . Potassium 05/29/2016 3.3* 3.5 - 5.1 mmol/L Final  . Chloride 05/29/2016 125* 101 - 111 mmol/L Final  . CO2 05/29/2016 20* 22 - 32 mmol/L Final  . Glucose, Bld 05/29/2016 121* 65 - 99 mg/dL Final  . BUN 05/29/2016 50* 6 - 20 mg/dL Final  . Creatinine, Ser 05/29/2016 1.31* 0.44 - 1.00 mg/dL Final  . Calcium 05/29/2016 8.3* 8.9 - 10.3 mg/dL Final  . Total Protein 05/29/2016 6.4* 6.5 - 8.1 g/dL Final  . Albumin 05/29/2016 3.1* 3.5 - 5.0 g/dL Final  . AST 05/29/2016 37  15 - 41 U/L Final  . ALT 05/29/2016 41  14 - 54 U/L Final  . Alkaline Phosphatase 05/29/2016 74  38 - 126 U/L Final  . Total Bilirubin 05/29/2016 0.9  0.3 - 1.2 mg/dL Final  . GFR calc non Af Amer 05/29/2016 36* >60 mL/min Final  . GFR calc Af Amer 05/29/2016 42* >60 mL/min Final   Comment: (NOTE) The eGFR has been calculated using the CKD  EPI equation. This calculation has not been validated in all clinical situations. eGFR's persistently <60 mL/min signify possible Chronic Kidney Disease.   . Anion gap 05/29/2016 10  5 - 15 Final  . Sodium 05/28/2016 162* 135 - 145 mmol/L Final   Comment: CRITICAL RESULT CALLED TO, READ BACK BY AND VERIFIED WITH: Marian Sorrow RN [phone] [address] GRE[address] R   . Potassium 05/28/2016 3.9  3.5 - 5.1 mmol/L Final  . Chloride 05/28/2016 >130* 101 - 111 mmol/L Final   Comment: CRITICAL RESULT CALLED TO, READ BACK BY AND VERIFIED WITH: Marian Sorrow RN [phone] [address] GRE[address] R   . CO2 05/28/2016 24  22 - 32 mmol/L Final  . Glucose, Bld 05/28/2016 124* 65 - 99 mg/dL Final  . BUN 05/28/2016 54* 6 - 20 mg/dL Final  . Creatinine, Ser  05/28/2016 1.42* 0.44 - 1.00 mg/dL Final  . Calcium 05/28/2016 8.7* 8.9 - 10.3 mg/dL Final  . GFR calc non Af Amer 05/28/2016 33* >60 mL/min Final  . GFR calc Af Amer 05/28/2016 38* >60 mL/min Final   Comment: (NOTE) The eGFR has been calculated using the CKD EPI equation. This calculation has not been validated in all clinical situations. eGFR's persistently <60 mL/min signify possible Chronic Kidney Disease.   . Anion gap 05/28/2016 NOT CALCULATED  5 - 15 Final  . Sodium 05/30/2016 144  135 - 145 mmol/L Final  . Potassium 05/30/2016 2.7* 3.5 - 5.1 mmol/L Final   Comment: CRITICAL RESULT CALLED TO, READ BACK BY AND VERIFIED WITH: M MCMILLIAN,RN [phone] [place]   . Chloride 05/30/2016 112* 101 - 111 mmol/L Final  . CO2 05/30/2016 21* 22 - 32 mmol/L Final  . Glucose, Bld 05/30/2016 116* 65 - 99 mg/dL Final  . BUN 05/30/2016 35* 6 - 20 mg/dL Final  . Creatinine, Ser 05/30/2016 1.12* 0.44 - 1.00 mg/dL Final  . Calcium 05/30/2016 7.8* 8.9 - 10.3 mg/dL Final  . Total Protein 05/30/2016 5.9* 6.5 - 8.1 g/dL Final  . Albumin 05/30/2016 2.8* 3.5 - 5.0 g/dL Final  . AST 05/30/2016 35  15 - 41 U/L Final  . ALT 05/30/2016 33  14 - 54 U/L Final  . Alkaline Phosphatase  05/30/2016 69  38 - 126 U/L Final  . Total Bilirubin 05/30/2016 0.7  0.3 - 1.2 mg/dL Final  . GFR calc non Af Amer 05/30/2016 44* >60 mL/min Final  . GFR calc Af Amer 05/30/2016 50* >60 mL/min Final   Comment: (NOTE) The eGFR has been calculated using the CKD EPI equation. This calculation has not been validated in all clinical situations. eGFR's persistently <60 mL/min signify possible Chronic Kidney Disease.   . Anion gap 05/30/2016 11  5 - 15 Final  . WBC 05/30/2016 10.7* 4.0 - 10.5 K/uL Final  . RBC 05/30/2016 4.03  3.87 - 5.11 MIL/uL Final  . Hemoglobin 05/30/2016 11.8* 12.0 - 15.0 g/dL Final  . HCT 05/30/2016 37.7  36.0 - 46.0 % Final  . MCV 05/30/2016 93.5  78.0 - 100.0 fL Final  . MCH 05/30/2016 29.3  26.0 - 34.0 pg Final  . MCHC 05/30/2016 31.3  30.0 - 36.0 g/dL Final  . RDW 05/30/2016 14.8  11.5 - 15.5 % Final  . Platelets 05/30/2016 107* 150 - 400 K/uL Final  . Neutrophils Relative % 05/30/2016 70  % Final  . Neutro Abs 05/30/2016 7.5  1.7 - 7.7 K/uL Final  . Lymphocytes Relative 05/30/2016 22  % Final  . Lymphs Abs 05/30/2016 2.4  0.7 - 4.0 K/uL Final  . Monocytes Relative 05/30/2016 7  % Final  . Monocytes Absolute 05/30/2016 0.7  0.1 - 1.0 K/uL Final  . Eosinophils Relative 05/30/2016 1  % Final  . Eosinophils Absolute 05/30/2016 0.1  0.0 - 0.7 K/uL Final  . Basophils Relative 05/30/2016 0  % Final  . Basophils Absolute 05/30/2016 0.0  0.0 - 0.1 K/uL Final  . Magnesium 05/30/2016 1.9  1.7 - 2.4 mg/dL Final  . Sodium 05/30/2016 146* 135 - 145 mmol/L Final  . Potassium 05/30/2016 3.2* 3.5 - 5.1 mmol/L Final  . Chloride 05/30/2016 115* 101 - 111 mmol/L Final  . CO2 05/30/2016 22  22 - 32 mmol/L Final  . Glucose, Bld 05/30/2016 89  65 - 99 mg/dL Final  . BUN 05/30/2016 30* 6 - 20 mg/dL Final  .  Creatinine, Ser 05/30/2016 1.04* 0.44 - 1.00 mg/dL Final  . Calcium 05/30/2016 8.1* 8.9 - 10.3 mg/dL Final  . GFR calc non Af Amer 05/30/2016 48* >60 mL/min Final  . GFR calc  Af Amer 05/30/2016 55* >60 mL/min Final   Comment: (NOTE) The eGFR has been calculated using the CKD EPI equation. This calculation has not been validated in all clinical situations. eGFR's persistently <60 mL/min signify possible Chronic Kidney Disease.   . Anion gap 05/30/2016 9  5 - 15 Final  . Sodium, Ur 05/31/2016 102  mmol/L Final  . Potassium Urine 05/31/2016 56  mmol/L Final  . Osmolality, Ur 05/31/2016 532  300 - 900 mOsm/kg Final  . Sodium 05/31/2016 149* 135 - 145 mmol/L Final  . Potassium 05/31/2016 4.4  3.5 - 5.1 mmol/L Final  . Chloride 05/31/2016 122* 101 - 111 mmol/L Final  . CO2 05/31/2016 21* 22 - 32 mmol/L Final  . Glucose, Bld 05/31/2016 85  65 - 99 mg/dL Final  . BUN 05/31/2016 23* 6 - 20 mg/dL Final  . Creatinine, Ser 05/31/2016 0.96  0.44 - 1.00 mg/dL Final  . Calcium 05/31/2016 8.3* 8.9 - 10.3 mg/dL Final  . GFR calc non Af Amer 05/31/2016 52* >60 mL/min Final  . GFR calc Af Amer 05/31/2016 >60  >60 mL/min Final   Comment: (NOTE) The eGFR has been calculated using the CKD EPI equation. This calculation has not been validated in all clinical situations. eGFR's persistently <60 mL/min signify possible Chronic Kidney Disease.   . Anion gap 05/31/2016 6  5 - 15 Final  . WBC 05/31/2016 11.3* 4.0 - 10.5 K/uL Final  . RBC 05/31/2016 4.35  3.87 - 5.11 MIL/uL Final  . Hemoglobin 05/31/2016 12.8  12.0 - 15.0 g/dL Final  . HCT 05/31/2016 40.8  36.0 - 46.0 % Final  . MCV 05/31/2016 93.8  78.0 - 100.0 fL Final  . MCH 05/31/2016 29.4  26.0 - 34.0 pg Final  . MCHC 05/31/2016 31.4  30.0 - 36.0 g/dL Final  . RDW 05/31/2016 14.8  11.5 - 15.5 % Final  . Platelets 05/31/2016 64* 150 - 400 K/uL Final   Comment: REPEATED TO VERIFY PLATELET COUNT CONFIRMED BY SMEAR   . Magnesium 05/31/2016 2.2  1.7 - 2.4 mg/dL Final  . WBC 06/01/2016 8.9  4.0 - 10.5 K/uL Final  . RBC 06/01/2016 3.84* 3.87 - 5.11 MIL/uL Final  . Hemoglobin 06/01/2016 11.1* 12.0 - 15.0 g/dL Final  . HCT  06/01/2016 36.6  36.0 - 46.0 % Final  . MCV 06/01/2016 95.3  78.0 - 100.0 fL Final  . MCH 06/01/2016 28.9  26.0 - 34.0 pg Final  . MCHC 06/01/2016 30.3  30.0 - 36.0 g/dL Final  . RDW 06/01/2016 15.1  11.5 - 15.5 % Final  . Platelets 06/01/2016 103* 150 - 400 K/uL Final  . Sodium 06/01/2016 145  135 - 145 mmol/L Final  . Potassium 06/01/2016 4.5  3.5 - 5.1 mmol/L Final  . Chloride 06/01/2016 118* 101 - 111 mmol/L Final  . CO2 06/01/2016 17* 22 - 32 mmol/L Final  . Glucose, Bld 06/01/2016 94  65 - 99 mg/dL Final  . BUN 06/01/2016 18  6 - 20 mg/dL Final  . Creatinine, Ser 06/01/2016 0.89  0.44 - 1.00 mg/dL Final  . Calcium 06/01/2016 8.1* 8.9 - 10.3 mg/dL Final  . GFR calc non Af Amer 06/01/2016 57* >60 mL/min Final  . GFR calc Af Amer 06/01/2016 >60  >60 mL/min Final  Comment: (NOTE) The eGFR has been calculated using the CKD EPI equation. This calculation has not been validated in all clinical situations. eGFR's persistently <60 mL/min signify possible Chronic Kidney Disease.   . Anion gap 06/01/2016 10  5 - 15 Final  Office Visit on 05/25/2016  Component Date Value Ref Range Status  . Sodium 05/25/2016 165* 135 - 146 mmol/L Final  . Potassium 05/25/2016 3.7  3.5 - 5.3 mmol/L Final  . Chloride 05/25/2016 128* 98 - 110 mmol/L Final  . CO2 05/25/2016 25  20 - 31 mmol/L Final  . Glucose, Bld 05/25/2016 92  65 - 99 mg/dL Final  . BUN 05/25/2016 53* 7 - 25 mg/dL Final  . Creat 05/25/2016 1.17* 0.60 - 0.88 mg/dL Final   Comment:   For patients > or = 81 years of age: The upper reference limit for Creatinine is approximately 13% higher for people identified as African-American.     . Total Bilirubin 05/25/2016 0.5  0.2 - 1.2 mg/dL Final  . Alkaline Phosphatase 05/25/2016 85  33 - 130 U/L Final  . AST 05/25/2016 45* 10 - 35 U/L Final  . ALT 05/25/2016 47* 6 - 29 U/L Final  . Total Protein 05/25/2016 7.5  6.1 - 8.1 g/dL Final  . Albumin 05/25/2016 4.0  3.6 - 5.1 g/dL Final  .  Calcium 05/25/2016 9.0  8.6 - 10.4 mg/dL Final  . GFR, Est African American 05/25/2016 49* >=60 mL/min Final  . GFR, Est Non African American 05/25/2016 43* >=60 mL/min Final  . Cholesterol 05/25/2016 156  <200 mg/dL Final  . Triglycerides 05/25/2016 104  <150 mg/dL Final  . HDL 05/25/2016 53  >50 mg/dL Final  . Total CHOL/HDL Ratio 05/25/2016 2.9  <5.0 Ratio Final  . VLDL 05/25/2016 21  <30 mg/dL Final  . LDL Cholesterol 05/25/2016 82  <100 mg/dL Final    Dg Chest 2 View  Result Date: 05/28/2016 CLINICAL DATA:  History of cancer, COPD, dementia. EXAM: CHEST  2 VIEW COMPARISON:  09/13/2015 FINDINGS: The heart is enlarged. Postoperative changes are identified in the right lung apex. There are no focal consolidations, pleural effusions, or pulmonary edema. Perihilar peribronchial thickening is present. IMPRESSION: Stable cardiomegaly.  Bronchitic changes. Electronically Signed   By: Nolon Nations M.D.   On: 05/28/2016 14:01   Ct Head Wo Contrast  Result Date: 05/28/2016 CLINICAL DATA:  Altered mental status, lethargy, not drinking well, odor to urine, history COPD, lung cancer, Alzheimer's EXAM: CT HEAD WITHOUT CONTRAST TECHNIQUE: Contiguous axial images were obtained from the base of the skull through the vertex without intravenous contrast. Sagittal and coronal MPR images reconstructed from axial data set. COMPARISON:  12/08/2015 FINDINGS: Brain: Generalized atrophy. Normal ventricular morphology. No midline shift or mass effect. Small vessel chronic ischemic changes of deep cerebral white matter. No intracranial hemorrhage, mass lesion, evidence of acute infarction, or extra-axial fluid collection. Vascular: Atherosclerotic calcifications of internal carotid and vertebral arteries at skullbase Skull: Intact Sinuses/Orbits: Clear Other: N/A IMPRESSION: Atrophy with small vessel chronic ischemic changes of deep cerebral white matter. No acute intracranial abnormalities. Electronically Signed   By:  Lavonia Dana M.D.   On: 05/28/2016 12:08   Dg Chest Port 1 View  Result Date: 05/30/2016 CLINICAL DATA:  Leukocytosis.  History of lung mass. EXAM: PORTABLE CHEST 1 VIEW COMPARISON:  05/28/2016 FINDINGS: Postsurgical changes in the right chest with surgical clips in the right lung. Heart size remains mildly enlarged but stable. No new airspace disease or pulmonary  edema. No acute bone abnormality. IMPRESSION: Stable mild cardiomegaly. No acute chest findings. Electronically Signed   By: Markus Daft M.D.   On: 05/30/2016 08:00     Assessment/Plan   ICD-9-CM ICD-10-CM   1. Hypernatremia 276.0 E87.0 CANCELED: BMP with eGFR   resolved  2. Thrombocytopenia (HCC) 287.5 D69.6 CBC with Differential/Platelets  3. Failure to thrive in adult 783.7 R62.7   4. Need for zoster vaccination V04.89 Z23 Zoster Vaccine Live, PF, (ZOSTAVAX) 37023 UNT/0.65ML injection  5. Need for Tdap vaccination V06.1 Z23 Tdap (BOOSTRIX) 5-2.5-18.5 LF-MCG/0.5 injection  6. Alzheimer's dementia without behavioral disturbance, unspecified timing of dementia onset 331.0 G30.9    294.10 F02.80    endstage  7. Anemia, unspecified type 285.9 D64.9     Cont HH services with [facility] - PT/OT/ST and RN for wound/med mx  Continue current medications as ordered  Push fluids  Will call with lab results  Follow up as scheduled in June 2018  [place]. Perlie Gold  [facility] [address] [place], [place] [address] [phone] Cell (Monday-Friday 8 AM - 5 PM) [phone] After 5 PM and follow prompts

## 2016-06-13 LAB — BASIC METABOLIC PANEL
BUN: 12 mg/dL (ref 4–21)
Creatinine: 0.8 mg/dL (ref 0.5–1.1)
Glucose: 94 mg/dL
POTASSIUM: 4.9 mmol/L (ref 3.4–5.3)
SODIUM: 146 mmol/L (ref 137–147)

## 2016-06-13 LAB — CBC AND DIFFERENTIAL
HEMATOCRIT: 35 % — AB (ref 36–46)
HEMOGLOBIN: 11.1 g/dL — AB (ref 12.0–16.0)
NEUTROS ABS: 3 /uL
Platelets: 260 10*3/uL (ref 150–399)
WBC: 6.9 10*3/mL

## 2016-06-20 ENCOUNTER — Telehealth: Payer: Self-pay | Admitting: *Deleted

## 2016-06-20 NOTE — Telephone Encounter (Signed)
Stacey Fernandez, Caregiver called and stated that every time patient comes off of the Lasix her Ankles swell up. Stated as long as she is on the Lasix she doesn't have that problem. No weeping, just swelling. Please Advise.

## 2016-06-20 NOTE — Telephone Encounter (Signed)
Recommend keep legs elevated when seated. Have HH further address LE swelling. Do not recommend lasix due to increased risk of elevated sodium from dehydration

## 2016-06-21 NOTE — Telephone Encounter (Signed)
LMOM to return call.

## 2016-06-25 NOTE — Telephone Encounter (Signed)
LMOM to return call.

## 2016-06-25 NOTE — Telephone Encounter (Signed)
Patient caregiver, Freda Munro notified and agreed. Stated that the swelling is better and will call if anymore problems.

## 2016-07-18 ENCOUNTER — Telehealth: Payer: Self-pay | Admitting: Internal Medicine

## 2016-07-18 NOTE — Telephone Encounter (Signed)
I called to schedule AWV but there was no answer and no option to leave voicemail. VDM (DD)

## 2016-07-18 NOTE — Telephone Encounter (Signed)
After seeing the phone note to contact Nolon Stalls, I called and left a message asking her to confirm AWV appt. VDM (DD)

## 2016-07-29 ENCOUNTER — Other Ambulatory Visit: Payer: Self-pay | Admitting: Internal Medicine

## 2016-07-29 DIAGNOSIS — E43 Unspecified severe protein-calorie malnutrition: Secondary | ICD-10-CM

## 2016-07-29 DIAGNOSIS — R627 Adult failure to thrive: Secondary | ICD-10-CM

## 2016-08-10 NOTE — Telephone Encounter (Signed)
I left a message with the patient's niece, Nolon Stalls, asking her to confirm the AWV appt with nurse before seeing Dr. Eulas Post. VDM (DD)

## 2016-08-25 ENCOUNTER — Other Ambulatory Visit: Payer: Self-pay | Admitting: Internal Medicine

## 2016-09-04 ENCOUNTER — Ambulatory Visit (INDEPENDENT_AMBULATORY_CARE_PROVIDER_SITE_OTHER): Payer: Medicare Other | Admitting: Nurse Practitioner

## 2016-09-04 ENCOUNTER — Encounter: Payer: Self-pay | Admitting: Nurse Practitioner

## 2016-09-04 VITALS — BP 118/72 | HR 63 | Temp 97.5°F | Resp 16 | Ht 60.0 in | Wt 88.9 lb

## 2016-09-04 DIAGNOSIS — R8290 Unspecified abnormal findings in urine: Secondary | ICD-10-CM

## 2016-09-04 DIAGNOSIS — G309 Alzheimer's disease, unspecified: Secondary | ICD-10-CM | POA: Diagnosis not present

## 2016-09-04 DIAGNOSIS — R5383 Other fatigue: Secondary | ICD-10-CM

## 2016-09-04 DIAGNOSIS — F0280 Dementia in other diseases classified elsewhere without behavioral disturbance: Secondary | ICD-10-CM | POA: Diagnosis not present

## 2016-09-04 LAB — BASIC METABOLIC PANEL WITH GFR
BUN: 20 mg/dL (ref 7–25)
CALCIUM: 8.7 mg/dL (ref 8.6–10.4)
CO2: 19 mmol/L — ABNORMAL LOW (ref 20–31)
Chloride: 110 mmol/L (ref 98–110)
Creat: 1.09 mg/dL — ABNORMAL HIGH (ref 0.60–0.88)
GFR, EST AFRICAN AMERICAN: 53 mL/min — AB (ref >=60)
GFR, Est Non African American: 46 mL/min — ABNORMAL LOW (ref >=60)
GLUCOSE: 120 mg/dL — AB (ref 65–99)
Potassium: 3.6 mmol/L (ref 3.5–5.3)
Sodium: 144 mmol/L (ref 135–146)

## 2016-09-04 NOTE — Progress Notes (Signed)
Careteam: Patient Care Team: Gildardo Cranker, DO as PCP - General (Internal Medicine) Grant Fontana, MD (Inactive) (Internal Medicine) Prescott Gum, Collier Salina, MD (Cardiothoracic Surgery)  Advanced Directive information Does Patient Have a Medical Advance Directive?: Yes, Type of Advance Directive: [place];Living will  No Known Allergies  Chief Complaint  Patient presents with  . Acute Visit    Pt is being seen due to a strong odor to urine and behavior changes x 2 days.      HPI: Patient is a 83 y.o. female seen in the office today due to possible urinary tract infection. Here today with care giver. Yesterday she was very out of it today she is more alert. Eating and drinking well today.  No fevers.  Urine has a strong odor. She gives her a tylenol for arthritis pain in the morning.  At night she is incontinent of bowel and bladder but able to go in the commode during the day. incontinent episode today   Review of Systems:  Review of Systems  Unable to perform ROS: Dementia    Past Medical History:  Diagnosis Date  . Anxiety state, unspecified   . Arthritis    "hands, feet" (09/13/2015)  . Chronic airway obstruction, not elsewhere classified   . Chronic kidney disease (CKD), stage II (mild)    [name] 09/13/2015  . COPD (chronic obstructive pulmonary disease) ([place])   . Dementia   . Functional constipation    [name] 09/13/2015  . Hemiplegia affecting dominant side, late effect of cerebrovascular disease   . Hyperlipidemia   . Lung cancer ([place])   . Lung mass   . Memory loss   . Mixed Alzheimer's and vascular dementia    /notes 09/13/2015  . Other malaise and fatigue   . Protein calorie malnutrition ([place])    [name] 09/13/2015  . Sacral decubitus ulcer    chronic; stage IV/notes 09/13/2015  . Senile dementia with delirium   . Urinary frequency    Past Surgical History:  Procedure Laterality Date  . CATARACT EXTRACTION, BILATERAL    . mediastinal  lymp node dissection  01/10/2011   VAN TRIGT  . WEDGE RESECTION RUL NODULE  01/10/2011   VAN TRIGT   Social History:   reports that she quit smoking about 6 years ago. Her smoking use included Cigarettes. She smoked 0.50 packs per day. She has never used smokeless tobacco. She reports that she does not drink alcohol or use drugs.  Family History  Problem Relation Age of Onset  . Heart disease Mother   . Cancer Father   . Heart disease Sister   . Heart disease Brother     Medications: Patient's Medications  New Prescriptions   No medications on file  Previous Medications   AMBULATORY NON FORMULARY MEDICATION    1. Roho Cushion Dx: [id number] 2. Transfer Tub Bench Dx: R53.81   ASPIRIN EC 81 MG TABLET    Take 81 mg by mouth at bedtime.   ATORVASTATIN (LIPITOR) 10 MG TABLET    TAKE 1 TABLET BY MOUTH EVERY DAY   LATANOPROST (XALATAN) 0.005 % OPHTHALMIC SOLUTION    PLACE 1 DROP INTO BOTH EYES AT BEDTIME.   MEMANTINE (NAMENDA XR) 28 MG CP24 24 HR CAPSULE    TAKE ONE CAPSULE BY MOUTH EVERY DAY   MIRTAZAPINE (REMERON) 15 MG TABLET    TAKE 1 TABLET (15 MG TOTAL) BY MOUTH AT BEDTIME.   SODIUM CHLORIDE (OCEAN) 0.65 % SOLN NASAL SPRAY  Place 1 spray into both nostrils as needed for congestion.   TRUSOPT 2 % OPHTHALMIC SOLUTION    Place 2 drops into the right eye 2 (two) times daily.   Modified Medications   No medications on file  Discontinued Medications   No medications on file     Physical Exam:  Vitals:   09/04/16 1509  BP: 118/72  Pulse: 63  Resp: 16  Temp: 97.5 F (36.4 C)  TempSrc: Oral  SpO2: 95%  Weight: 88 lb 14.4 oz (40.3 kg)  Height: 5' (1.524 m)   Body mass index is 17.36 kg/m.  Physical Exam  Constitutional:  Frail appearing in NAD.   HENT:  Mouth/Throat: Oropharynx is clear and moist. No oropharyngeal exudate.  Eyes: Pupils are equal, round, and reactive to light. No scleral icterus.  Neck: Neck supple. Carotid bruit is not present. No tracheal deviation  present. No thyromegaly present.  Cardiovascular: Normal rate, regular rhythm, normal heart sounds and intact distal pulses.  Exam reveals no gallop and no friction rub.   No murmur heard. Pulmonary/Chest: Effort normal and breath sounds normal. No stridor. No respiratory distress. She has no wheezes. She has no rales.  Abdominal: Soft. Bowel sounds are normal. She exhibits no distension and no mass. There is no hepatomegaly. There is no tenderness. There is no rebound and no guarding.  Musculoskeletal: She exhibits edema and deformity (small joints).  Lymphadenopathy:    She has no cervical adenopathy.  Neurological: She is alert.  Skin: Skin is warm and dry. No rash noted.  Psychiatric: She has a normal mood and affect. Her behavior is normal. She is noncommunicative.    Labs reviewed: Basic Metabolic Panel:  Recent Labs  09/13/15 1805  05/30/16 0445 05/30/16 1436 05/31/16 0608 06/01/16 0634 06/13/16  NA  --   < > 144 146* 149* 145 146  K  --   < > 2.7* 3.2* 4.4 4.5 4.9  CL  --   < > 112* 115* 122* 118*  --   CO2  --   < > 21* 22 21* 17*  --   GLUCOSE  --   < > 116* 89 85 94  --   BUN  --   < > 35* 30* 23* 18 12  CREATININE  --   < > 1.12* 1.04* 0.96 0.89 0.8  CALCIUM  --   < > 7.8* 8.1* 8.3* 8.1*  --   MG 2.4  --  1.9  --  2.2  --   --   PHOS 3.1  --   --   --   --   --   --   TSH 1.518  --   --   --   --   --   --   < > = values in this interval not displayed. Liver Function Tests:  Recent Labs  05/28/16 1040 05/29/16 0617 05/30/16 0445  AST 74* 37 35  ALT 66* 41 33  ALKPHOS 78 74 69  BILITOT 0.7 0.9 0.7  PROT 7.2 6.4* 5.9*  ALBUMIN 3.7 3.1* 2.8*   No results for input(s): LIPASE, AMYLASE in the last 8760 hours. No results for input(s): AMMONIA in the last 8760 hours. CBC:  Recent Labs  12/16/15 1120  05/30/16 0445 05/31/16 0608 06/01/16 0634 06/13/16  WBC 5.2  < > 10.7* 11.3* 8.9 6.9  NEUTROABS 2,132  --  7.5  --   --  3  HGB 12.5  < > 11.8*  12.8  11.1* 11.1*  HCT 39.2  < > 37.7 40.8 36.6 35*  MCV 90.7  < > 93.5 93.8 95.3  --   PLT 164  < > 107* 64* 103* 260  < > = values in this interval not displayed. Lipid Panel:  Recent Labs  11/23/15 1520 05/25/16 0001  CHOL 156 156  HDL 62 53  LDLCALC 84 82  TRIG 52 104  CHOLHDL 2.5 2.9   TSH:  Recent Labs  09/13/15 1805  TSH 1.518   A1C: No results found for: HGBA1C   Assessment/Plan 1. Abnormal urine odor -to increase hydration -unable to get urine today, I&O attempted but she was incontinent of urine prior to attempt.  -will have caregiver get urine at home and bring to office - CBC with Differential/Platelets - BMP with eGFR  2. Alzheimer's dementia without behavioral disturbance, unspecified timing of dementia onset Advanced dementia. Requiring caregiver for ADLs.  conts on namenda  3. Lethargic Has improved today, however will get lab work today due to changes in mentation.  To increase fluids.  - CBC with Differential/Platelets - BMP with eGFR   Maricus Tanzi K. Harle Battiest  [facility] [phone] 8 am - 5 pm) [phone] (after hours)

## 2016-09-04 NOTE — Patient Instructions (Addendum)
Increase hydration- more fluids

## 2016-09-05 LAB — CBC WITH DIFFERENTIAL/PLATELET
Basophils Absolute: 62 cells/uL (ref 0–200)
Basophils Relative: 1 %
EOS ABS: 124 {cells}/uL (ref 15–500)
Eosinophils Relative: 2 %
HEMATOCRIT: 44.1 % (ref 35.0–45.0)
Hemoglobin: 14.2 g/dL (ref 11.7–15.5)
LYMPHS PCT: 29 %
Lymphs Abs: 1798 cells/uL (ref 850–3900)
MCH: 30 pg (ref 27.0–33.0)
MCHC: 32.2 g/dL (ref 32.0–36.0)
MCV: 93 fL (ref 80.0–100.0)
MONO ABS: 682 {cells}/uL (ref 200–950)
MONOS PCT: 11 %
MPV: 13 fL — ABNORMAL HIGH (ref 7.5–12.5)
Neutro Abs: 3534 cells/uL (ref 1500–7800)
Neutrophils Relative %: 57 %
PLATELETS: 171 10*3/uL (ref 140–400)
RBC: 4.74 MIL/uL (ref 3.80–5.10)
RDW: 13.8 % (ref 11.0–15.0)
WBC: 6.2 10*3/uL (ref 3.8–10.8)

## 2016-10-01 ENCOUNTER — Ambulatory Visit (INDEPENDENT_AMBULATORY_CARE_PROVIDER_SITE_OTHER): Payer: Medicare Other

## 2016-10-01 VITALS — BP 118/60 | HR 74 | Temp 97.9°F | Ht 60.0 in | Wt 93.0 lb

## 2016-10-01 DIAGNOSIS — E2839 Other primary ovarian failure: Secondary | ICD-10-CM

## 2016-10-01 DIAGNOSIS — Z0000 Encounter for general adult medical examination without abnormal findings: Secondary | ICD-10-CM

## 2016-10-01 MED ORDER — TETANUS-DIPHTH-ACELL PERTUSSIS 5-2.5-18.5 LF-MCG/0.5 IM SUSP: 0.5000 mL | Freq: Once | INTRAMUSCULAR | 0 refills | Status: AC

## 2016-10-01 NOTE — Patient Instructions (Addendum)
Stacey Fernandez , Thank you for taking time to come for your Medicare Wellness Visit. I appreciate your ongoing commitment to your health goals. Please review the following plan we discussed and let me know if I can assist you in the future.   Screening recommendations/referrals: Colonoscopy up to date, pt over age 81 Mammogram up to date, pt over age 21 Bone Density due Recommended yearly ophthalmology/optometry visit for glaucoma screening and checkup Recommended yearly dental visit for hygiene and checkup  Vaccinations: Influenza vaccine up to date. Due 1110/2018 Pneumococcal vaccine up to date Tdap vaccine due. I will send prescription to your pharmacy Shingles vaccine, check to see if she got the old or new one  Advanced directives: In chart  Conditions/risks identified: None  Next appointment: Dr. Eulas Post 10/05/16 @ 1:45 pm   Preventive Care 65 Years and Older, Female Preventive care refers to lifestyle choices and visits with your health care provider that can promote health and wellness. What does preventive care include?  A yearly physical exam. This is also called an annual well check.  Dental exams once or twice a year.  Routine eye exams. Ask your health care provider how often you should have your eyes checked.  Personal lifestyle choices, including:  Daily care of your teeth and gums.  Regular physical activity.  Eating a healthy diet.  Avoiding tobacco and drug use.  Limiting alcohol use.  Practicing safe sex.  Taking low-dose aspirin every day.  Taking vitamin and mineral supplements as recommended by your health care provider. What happens during an annual well check? The services and screenings done by your health care provider during your annual well check will depend on your age, overall health, lifestyle risk factors, and family history of disease. Counseling  Your health care provider may ask you questions about your:  Alcohol use.  Tobacco  use.  Drug use.  Emotional well-being.  Home and relationship well-being.  Sexual activity.  Eating habits.  History of falls.  Memory and ability to understand (cognition).  Work and work Statistician.  Reproductive health. Screening  You may have the following tests or measurements:  Height, weight, and BMI.  Blood pressure.  Lipid and cholesterol levels. These may be checked every 5 years, or more frequently if you are over 27 years old.  Skin check.  Lung cancer screening. You may have this screening every year starting at age 22 if you have a 30-pack-year history of smoking and currently smoke or have quit within the past 15 years.  Fecal occult blood test (FOBT) of the stool. You may have this test every year starting at age 64.  Flexible sigmoidoscopy or colonoscopy. You may have a sigmoidoscopy every 5 years or a colonoscopy every 10 years starting at age 48.  Hepatitis C blood test.  Hepatitis B blood test.  Sexually transmitted disease (STD) testing.  Diabetes screening. This is done by checking your blood sugar (glucose) after you have not eaten for a while (fasting). You may have this done every 1-3 years.  Bone density scan. This is done to screen for osteoporosis. You may have this done starting at age 78.  Mammogram. This may be done every 1-2 years. Talk to your health care provider about how often you should have regular mammograms. Talk with your health care provider about your test results, treatment options, and if necessary, the need for more tests. Vaccines  Your health care provider may recommend certain vaccines, such as:  Influenza vaccine. This  is recommended every year.  Tetanus, diphtheria, and acellular pertussis (Tdap, Td) vaccine. You may need a Td booster every 10 years.  Zoster vaccine. You may need this after age 48.  Pneumococcal 13-valent conjugate (PCV13) vaccine. One dose is recommended after age 103.  Pneumococcal  polysaccharide (PPSV23) vaccine. One dose is recommended after age 48. Talk to your health care provider about which screenings and vaccines you need and how often you need them. This information is not intended to replace advice given to you by your health care provider. Make sure you discuss any questions you have with your health care provider. Document Released: 04/29/2015 Document Revised: 12/21/2015 Document Reviewed: 02/01/2015 Elsevier Interactive Patient Education  2017 [employer] Prevention in the Home Falls can cause injuries. They can happen to people of all ages. There are many things you can do to make your home safe and to help prevent falls. What can I do on the outside of my home?  Regularly fix the edges of walkways and driveways and fix any cracks.  Remove anything that might make you trip as you walk through a door, such as a raised step or threshold.  Trim any bushes or trees on the path to your home.  Use bright outdoor lighting.  Clear any walking paths of anything that might make someone trip, such as rocks or tools.  Regularly check to see if handrails are loose or broken. Make sure that both sides of any steps have handrails.  Any raised decks and porches should have guardrails on the edges.  Have any leaves, snow, or ice cleared regularly.  Use sand or salt on walking paths during winter.  Clean up any spills in your garage right away. This includes oil or grease spills. What can I do in the bathroom?  Use night lights.  Install grab bars by the toilet and in the tub and shower. Do not use towel bars as grab bars.  Use non-skid mats or decals in the tub or shower.  If you need to sit down in the shower, use a plastic, non-slip stool.  Keep the floor dry. Clean up any water that spills on the floor as soon as it happens.  Remove soap buildup in the tub or shower regularly.  Attach bath mats securely with double-sided non-slip rug  tape.  Do not have throw rugs and other things on the floor that can make you trip. What can I do in the bedroom?  Use night lights.  Make sure that you have a light by your bed that is easy to reach.  Do not use any sheets or blankets that are too big for your bed. They should not hang down onto the floor.  Have a firm chair that has side arms. You can use this for support while you get dressed.  Do not have throw rugs and other things on the floor that can make you trip. What can I do in the kitchen?  Clean up any spills right away.  Avoid walking on wet floors.  Keep items that you use a lot in easy-to-reach places.  If you need to reach something above you, use a strong step stool that has a grab bar.  Keep electrical cords out of the way.  Do not use floor polish or wax that makes floors slippery. If you must use wax, use non-skid floor wax.  Do not have throw rugs and other things on the floor that can make you  trip. What can I do with my stairs?  Do not leave any items on the stairs.  Make sure that there are handrails on both sides of the stairs and use them. Fix handrails that are broken or loose. Make sure that handrails are as long as the stairways.  Check any carpeting to make sure that it is firmly attached to the stairs. Fix any carpet that is loose or worn.  Avoid having throw rugs at the top or bottom of the stairs. If you do have throw rugs, attach them to the floor with carpet tape.  Make sure that you have a light switch at the top of the stairs and the bottom of the stairs. If you do not have them, ask someone to add them for you. What else can I do to help prevent falls?  Wear shoes that:  Do not have high heels.  Have rubber bottoms.  Are comfortable and fit you well.  Are closed at the toe. Do not wear sandals.  If you use a stepladder:  Make sure that it is fully opened. Do not climb a closed stepladder.  Make sure that both sides of the  stepladder are locked into place.  Ask someone to hold it for you, if possible.  Clearly mark and make sure that you can see:  Any grab bars or handrails.  First and last steps.  Where the edge of each step is.  Use tools that help you move around (mobility aids) if they are needed. These include:  Canes.  Walkers.  Scooters.  Crutches.  Turn on the lights when you go into a dark area. Replace any light bulbs as soon as they burn out.  Set up your furniture so you have a clear path. Avoid moving your furniture around.  If any of your floors are uneven, fix them.  If there are any pets around you, be aware of where they are.  Review your medicines with your doctor. Some medicines can make you feel dizzy. This can increase your chance of falling. Ask your doctor what other things that you can do to help prevent falls. This information is not intended to replace advice given to you by your health care provider. Make sure you discuss any questions you have with your health care provider. Document Released: 01/27/2009 Document Revised: 09/08/2015 Document Reviewed: 05/07/2014 Elsevier Interactive Patient Education  2017 [employer].

## 2016-10-01 NOTE — Addendum Note (Signed)
Addended by: Rich Reining E on: 10/01/2016 04:29 PM   Modules accepted: Orders

## 2016-10-01 NOTE — Progress Notes (Signed)
Subjective:   Stacey Fernandez is a 83 y.o. female who presents for an Initial Medicare Annual Wellness Visit.    Objective:    Today's Vitals   10/01/16 1500  BP: 118/60  Pulse: 74  Temp: 97.9 F (36.6 C)  TempSrc: Oral  Weight: 93 lb (42.2 kg)  Height: 5' (1.524 m)   Body mass index is 18.16 kg/m.   Current Medications (verified) Outpatient Encounter Prescriptions as of 10/01/2016  Medication Sig  . aspirin EC 81 MG tablet Take 81 mg by mouth at bedtime.  Marland Kitchen atorvastatin (LIPITOR) 10 MG tablet TAKE 1 TABLET BY MOUTH EVERY DAY  . latanoprost (XALATAN) 0.005 % ophthalmic solution PLACE 1 DROP INTO BOTH EYES AT BEDTIME.  . memantine (NAMENDA XR) 28 MG CP24 24 hr capsule TAKE ONE CAPSULE BY MOUTH EVERY DAY  . mirtazapine (REMERON) 15 MG tablet TAKE 1 TABLET (15 MG TOTAL) BY MOUTH AT BEDTIME.  . TRUSOPT 2 % ophthalmic solution Place 2 drops into the right eye 2 (two) times daily.   . sodium chloride (OCEAN) 0.65 % SOLN nasal spray Place 1 spray into both nostrils as needed for congestion. (Patient not taking: Reported on 10/01/2016)  . [DISCONTINUED] AMBULATORY NON FORMULARY MEDICATION 1. Roho Cushion Dx: [id number] 2. Transfer Tub Bench Dx: R53.81   No facility-administered encounter medications on file as of 10/01/2016.     Allergies (verified) Patient has no known allergies.   History: Past Medical History:  Diagnosis Date  . Anxiety state, unspecified   . Arthritis    "hands, feet" (09/13/2015)  . Chronic airway obstruction, not elsewhere classified   . Chronic kidney disease (CKD), stage II (mild)    [name] 09/13/2015  . COPD (chronic obstructive pulmonary disease) ([place])   . Dementia   . Functional constipation    [name] 09/13/2015  . Hemiplegia affecting dominant side, late effect of cerebrovascular disease   . Hyperlipidemia   . Lung cancer ([place])   . Lung mass   . Memory loss   . Mixed Alzheimer's and vascular dementia    /notes 09/13/2015  . Other malaise  and fatigue   . Protein calorie malnutrition ([place])    [name] 09/13/2015  . Sacral decubitus ulcer    chronic; stage IV/notes 09/13/2015  . Senile dementia with delirium   . Urinary frequency    Past Surgical History:  Procedure Laterality Date  . CATARACT EXTRACTION, BILATERAL    . mediastinal lymp node dissection  01/10/2011   VAN TRIGT  . WEDGE RESECTION RUL NODULE  01/10/2011   VAN TRIGT   Family History  Problem Relation Age of Onset  . Heart disease Mother   . Cancer Father   . Heart disease Sister   . Heart disease Brother    Social History   Occupational History  . Not on file.   Social History Main Topics  . Smoking status: Former Smoker    Packs/day: 0.50    Years: 20.00    Types: Cigarettes    Quit date: 06/09/2010  . Smokeless tobacco: Never Used  . Alcohol use No  . Drug use: No  . Sexual activity: No    Tobacco Counseling Counseling given: Not Answered   Activities of Daily Living In your present state of health, do you have any difficulty performing the following activities: 10/01/2016 05/30/2016  Hearing? N N  Vision? Y N  Difficulty concentrating or making decisions? Tempie Donning  Walking or climbing stairs? Y Y  Dressing or  bathing? Y Y  Doing errands, shopping? Tempie Donning  Preparing Food and eating ? Y -  Using the Toilet? Y -  In the past six months, have you accidently leaked urine? Y -  Do you have problems with loss of bowel control? Y -  Managing your Medications? Y -  Managing your Finances? Y -  Housekeeping or managing your Housekeeping? Y -  Some recent data might be hidden    Immunizations and Health Maintenance Immunization History  Administered Date(s) Administered  . Influenza,inj,Quad PF,36+ Mos 03/08/2015, 02/24/2016  . Influenza-Unspecified 11/14/2013  . Pneumococcal Conjugate-13 09/09/2014  . Pneumococcal Polysaccharide-23 11/23/2015   Health Maintenance Due  Topic Date Due  . TETANUS/TDAP  05/02/1948  . DEXA SCAN  05/02/1996  .  MAMMOGRAM  10/20/2015    Patient Care Team: Gildardo Cranker, DO as PCP - General (Internal Medicine) Grant Fontana, MD (Inactive) (Internal Medicine) Prescott Gum, Collier Salina, MD (Cardiothoracic Surgery)  Indicate any recent Medical Services you may have received from other than [facility] providers in the past year (date may be approximate).     Assessment:   This is a routine wellness examination for Stacey Fernandez.   Hearing/Vision screen No exam data present  Dietary issues and exercise activities discussed: Current Exercise Habits: The patient does not participate in regular exercise at present, Exercise limited by: neurologic condition(s)  Goals    None     Depression Screen PHQ 2/9 Scores 10/01/2016 09/26/2015 09/06/2015 09/09/2014 03/03/2014 02/04/2013  PHQ - 2 Score - 0 0 0 0 0  Exception Documentation Other- indicate reason in comment box - - - - -    Fall Risk Fall Risk  10/01/2016 09/04/2016 06/06/2016 05/25/2016 02/24/2016  Falls in the past year? No No No No No    Cognitive Function: MMSE - Mini Mental State Exam 10/01/2016 09/08/2013  Not completed: Unable to complete -  Orientation to time - 1  Orientation to Place - 0  Registration - 3  Attention/ Calculation - 0  Recall - 0  Language- name 2 objects - 1  Language- repeat - 0  Language- follow 3 step command - 0  Language- read & follow direction - 0  Write a sentence - 0  Copy design - 0  Total score - 5        Screening Tests Health Maintenance  Topic Date Due  . TETANUS/TDAP  05[birth year]02/1950  . DEXA SCAN  05/02/1996  . MAMMOGRAM  10/20/2015  . INFLUENZA VACCINE  11/14/2016  . PNA vac Low Risk Adult  Completed      Plan:    I have personally reviewed and addressed the Medicare Annual Wellness questionnaire and have noted the following in the patient's chart:  A. Medical and social history B. Use of alcohol, tobacco or illicit drugs  C. Current medications and supplements D. Functional ability and status E.    Nutritional status F.  Physical activity G. Advance directives H. List of other physicians I.  Hospitalizations, surgeries, and ER visits in previous 12 months J.  [place] to include hearing, vision, cognitive, depression L. Referrals and appointments - none  In addition, I have reviewed and discussed with patient certain preventive protocols, quality metrics, and best practice recommendations. A written personalized care plan for preventive services as well as general preventive health recommendations were provided to patient.  See attached scanned questionnaire for additional information.   Signed,   Rich Reining, RN Nurse Health Advisor  I reviewed health advisors  note, was available for consultation and agree with documentation and plan.  Carlos American. Harle Battiest  [facility] [phone] 8 am - 5 pm) [phone] (after hours)    Quick Notes   Health Maintenance: TDAP due, prescription sent to pharmacy. Pt daughter stated a bone density test was done at her home, if you think she needs one done at the breast center it can be ordered     Abnormal Screen: MMSE unable to complete due to dementia     Patient Concerns: none      Nurse Concerns: none

## 2016-10-05 ENCOUNTER — Other Ambulatory Visit: Payer: Self-pay | Admitting: Internal Medicine

## 2016-10-05 ENCOUNTER — Ambulatory Visit: Payer: Medicare Other

## 2016-10-05 ENCOUNTER — Ambulatory Visit (INDEPENDENT_AMBULATORY_CARE_PROVIDER_SITE_OTHER): Payer: Medicare Other | Admitting: Internal Medicine

## 2016-10-05 ENCOUNTER — Encounter: Payer: Self-pay | Admitting: Internal Medicine

## 2016-10-05 VITALS — BP 118/78 | HR 65 | Temp 97.2°F | Ht 60.0 in | Wt 92.0 lb

## 2016-10-05 DIAGNOSIS — R8290 Unspecified abnormal findings in urine: Secondary | ICD-10-CM | POA: Diagnosis not present

## 2016-10-05 DIAGNOSIS — F0150 Vascular dementia without behavioral disturbance: Secondary | ICD-10-CM

## 2016-10-05 DIAGNOSIS — E43 Unspecified severe protein-calorie malnutrition: Secondary | ICD-10-CM | POA: Diagnosis not present

## 2016-10-05 DIAGNOSIS — N182 Chronic kidney disease, stage 2 (mild): Secondary | ICD-10-CM

## 2016-10-05 DIAGNOSIS — I5032 Chronic diastolic (congestive) heart failure: Secondary | ICD-10-CM

## 2016-10-05 DIAGNOSIS — G309 Alzheimer's disease, unspecified: Secondary | ICD-10-CM

## 2016-10-05 DIAGNOSIS — R627 Adult failure to thrive: Secondary | ICD-10-CM

## 2016-10-05 DIAGNOSIS — F0280 Dementia in other diseases classified elsewhere without behavioral disturbance: Secondary | ICD-10-CM

## 2016-10-05 DIAGNOSIS — E782 Mixed hyperlipidemia: Secondary | ICD-10-CM

## 2016-10-05 DIAGNOSIS — I10 Essential (primary) hypertension: Secondary | ICD-10-CM | POA: Diagnosis not present

## 2016-10-05 LAB — COMPLETE METABOLIC PANEL WITH GFR
ALT: 36 U/L — ABNORMAL HIGH (ref 6–29)
AST: 35 U/L (ref 10–35)
Albumin: 3.6 g/dL (ref 3.6–5.1)
Alkaline Phosphatase: 89 U/L (ref 33–130)
BUN: 19 mg/dL (ref 7–25)
CHLORIDE: 110 mmol/L (ref 98–110)
CO2: 24 mmol/L (ref 20–31)
Calcium: 8.6 mg/dL (ref 8.6–10.4)
Creat: 0.92 mg/dL — ABNORMAL HIGH (ref 0.60–0.88)
GFR, EST AFRICAN AMERICAN: 66 mL/min (ref >=60)
GFR, EST NON AFRICAN AMERICAN: 57 mL/min — AB (ref >=60)
GLUCOSE: 81 mg/dL (ref 65–99)
POTASSIUM: 4.4 mmol/L (ref 3.5–5.3)
SODIUM: 143 mmol/L (ref 135–146)
Total Bilirubin: 0.4 mg/dL (ref 0.2–1.2)
Total Protein: 6.5 g/dL (ref 6.1–8.1)

## 2016-10-05 LAB — LIPID PANEL
CHOL/HDL RATIO: 3 ratio (ref <5.0)
Cholesterol: 149 mg/dL (ref <200)
HDL: 49 mg/dL — AB (ref >50)
LDL CALC: 85 mg/dL (ref <100)
TRIGLYCERIDES: 75 mg/dL (ref <150)
VLDL: 15 mg/dL (ref <30)

## 2016-10-05 NOTE — Progress Notes (Signed)
Patient ID: Stacey Fernandez, female   DOB: Aug 23, 1929, 83 y.o.   MRN: [id number]    Location:  [facility] Place of Service: OFFICE  Chief Complaint  Patient presents with  . Medical Management of Chronic Issues    4 month routine visit. Here with niece Velna Hatchet    HPI:  83 yo female seen today for f/u. She was seen by NP last month for malodorous urine. Urine sample not able to be obtained due to pt incontinent. Family pushing fluids and urine smell improved. No f/c. No N/V. Appetite ok. She is a poor historian due to dementia. Hx obtained from chart.  Dementia/anxiety/FTT/protein calorie malnutrition - takes namenda xr and remeron. No behavior disturbances. Weight 92 lbs (up from 91 lbs). She refuses ensure/boost supplements. Albumin 2.8. Na 144.  Hyperlipidemia - stable on lipitor. LDL 82  COPD/hx lung cancer- released from oncology last year as she has not had recurrence of tumor.   HTN/edema - stable on 1/2 tab of lasix. Also takes ASA daily  Constipation - stable. BM q3days  Glaucoma - stable on eye gtts. followed by eye specialist  Past Medical History:  Diagnosis Date  . Anxiety state, unspecified   . Arthritis    "hands, feet" (09/13/2015)  . Chronic airway obstruction, not elsewhere classified   . Chronic kidney disease (CKD), stage II (mild)    Hattie Perch 09/13/2015  . COPD (chronic obstructive pulmonary disease) (HCC)   . Dementia   . Functional constipation    Hattie Perch 09/13/2015  . Hemiplegia affecting dominant side, late effect of cerebrovascular disease   . Hyperlipidemia   . Lung cancer (HCC)   . Lung mass   . Memory loss   . Mixed Alzheimer's and vascular dementia    /notes 09/13/2015  . Other malaise and fatigue   . Protein calorie malnutrition (HCC)    Hattie Perch 09/13/2015  . Sacral decubitus ulcer    chronic; stage IV/notes 09/13/2015  . Senile dementia with delirium   . Urinary frequency     Past Surgical History:  Procedure Laterality Date  . CATARACT  EXTRACTION, BILATERAL    . mediastinal lymp node dissection  01/10/2011   VAN TRIGT  . WEDGE RESECTION RUL NODULE  01/10/2011   VAN TRIGT    Patient Care Team: Kirt Boys, DO as PCP - General (Internal Medicine) Jeri Cos, MD (Inactive) (Internal Medicine) Donata Clay, Theron Arista, MD (Cardiothoracic Surgery)  Social History   Social History  . Marital status: Divorced    Spouse name: N/A  . Number of children: N/A  . Years of education: N/A   Occupational History  . Not on file.   Social History Main Topics  . Smoking status: Former Smoker    Packs/day: 0.50    Years: 20.00    Types: Cigarettes    Quit date: 06/09/2010  . Smokeless tobacco: Never Used  . Alcohol use No  . Drug use: No  . Sexual activity: No   Other Topics Concern  . Not on file   Social History Narrative  . No narrative on file     reports that she quit smoking about 6 years ago. Her smoking use included Cigarettes. She has a 10.00 pack-year smoking history. She has never used smokeless tobacco. She reports that she does not drink alcohol or use drugs.  Family History  Problem Relation Age of Onset  . Heart disease Mother   . Cancer Father   . Heart disease Sister   . Heart  disease Brother    Family Status  Relation Status  . Mother Deceased  . Father Deceased  . Sister Deceased  . Brother Deceased  . Sister Alive       37  . Brother Alive       60  . Sister Alive       55  . Brother Alive       14  . Brother Alive       40  . Sister Deceased at age 35       blood clot  . Brother Alive       7  . Sister Deceased at age 72       blood clot   . Son Deceased       at birth     No Known Allergies  Medications: Patient's Medications  New Prescriptions   No medications on file  Previous Medications   ASPIRIN EC 81 MG TABLET    Take 81 mg by mouth at bedtime.   ATORVASTATIN (LIPITOR) 10 MG TABLET    TAKE 1 TABLET BY MOUTH EVERY DAY   LATANOPROST (XALATAN) 0.005 %  OPHTHALMIC SOLUTION    PLACE 1 DROP INTO BOTH EYES AT BEDTIME.   MEMANTINE (NAMENDA XR) 28 MG CP24 24 HR CAPSULE    TAKE ONE CAPSULE BY MOUTH EVERY DAY   MIRTAZAPINE (REMERON) 15 MG TABLET    TAKE 1 TABLET (15 MG TOTAL) BY MOUTH AT BEDTIME.   SODIUM CHLORIDE (OCEAN) 0.65 % SOLN NASAL SPRAY    Place 1 spray into both nostrils as needed for congestion.   TRUSOPT 2 % OPHTHALMIC SOLUTION    Place 2 drops into the right eye 2 (two) times daily.   Modified Medications   No medications on file  Discontinued Medications   No medications on file    Review of Systems  Unable to perform ROS: Dementia    Vitals:   10/05/16 1338  BP: 118/78  Pulse: 65  Temp: 97.2 F (36.2 C)  TempSrc: Oral  SpO2: 90%  Weight: 92 lb (41.7 kg)  Height: 5' (1.524 m)   Body mass index is 17.97 kg/m.  Physical Exam  Constitutional: She appears well-developed.  Frail appearing in NAD.   HENT:  Mouth/Throat: Oropharynx is clear and moist. No oropharyngeal exudate.  Eyes: Pupils are equal, round, and reactive to light. No scleral icterus.  Neck: Neck supple. Carotid bruit is not present. No tracheal deviation present.    Cardiovascular: Normal rate, regular rhythm, normal heart sounds and intact distal pulses.  Exam reveals no gallop and no friction rub.   No murmur heard. Pulses:      Dorsalis pedis pulses are 2+ on the right side, and 2+ on the left side.       Posterior tibial pulses are 2+ on the right side, and 2+ on the left side.  Trace LE edema b/l. no calf TTP.   Pulmonary/Chest: Effort normal and breath sounds normal. No respiratory distress. She has no wheezes. She has no rales.  Abdominal: Soft. Bowel sounds are normal. She exhibits no distension and no mass. There is no hepatomegaly. There is no tenderness. There is no rebound and no guarding.  Musculoskeletal: She exhibits edema and deformity (small joints).  Lymphadenopathy:    She has no cervical adenopathy.  Neurological: She is alert.    Skin: Skin is warm and dry. No rash noted.  B/l foot dusky appearing  Psychiatric: She has a normal  mood and affect. Her speech is normal and behavior is normal.  She is talking today     Labs reviewed: Office Visit on 09/04/2016  Component Date Value Ref Range Status  . WBC 09/04/2016 6.2  3.8 - 10.8 K/uL Final  . RBC 09/04/2016 4.74  3.80 - 5.10 MIL/uL Final  . Hemoglobin 09/04/2016 14.2  11.7 - 15.5 g/dL Final  . HCT 13/24/4010 44.1  35.0 - 45.0 % Final  . MCV 09/04/2016 93.0  80.0 - 100.0 fL Final  . MCH 09/04/2016 30.0  27.0 - 33.0 pg Final  . MCHC 09/04/2016 32.2  32.0 - 36.0 g/dL Final  . RDW 27/25/3664 13.8  11.0 - 15.0 % Final  . Platelets 09/04/2016 171  140 - 400 K/uL Final  . MPV 09/04/2016 13.0* 7.5 - 12.5 fL Final  . Neutro Abs 09/04/2016 3534  1,500 - 7,800 cells/uL Final  . Lymphs Abs 09/04/2016 1798  850 - 3,900 cells/uL Final  . Monocytes Absolute 09/04/2016 682  200 - 950 cells/uL Final  . Eosinophils Absolute 09/04/2016 124  15 - 500 cells/uL Final  . Basophils Absolute 09/04/2016 62  0 - 200 cells/uL Final  . Neutrophils Relative % 09/04/2016 57  % Final  . Lymphocytes Relative 09/04/2016 29  % Final  . Monocytes Relative 09/04/2016 11  % Final  . Eosinophils Relative 09/04/2016 2  % Final  . Basophils Relative 09/04/2016 1  % Final  . Smear Review 09/04/2016 Criteria for review not met   Final  . Sodium 09/04/2016 144  135 - 146 mmol/L Final  . Potassium 09/04/2016 3.6  3.5 - 5.3 mmol/L Final  . Chloride 09/04/2016 110  98 - 110 mmol/L Final  . CO2 09/04/2016 19* 20 - 31 mmol/L Final  . Glucose, Bld 09/04/2016 120* 65 - 99 mg/dL Final  . BUN 40/34/7425 20  7 - 25 mg/dL Final  . Creat 95/63/8756 1.09* 0.60 - 0.88 mg/dL Final   Comment:   For patients > or = 81 years of age: The upper reference limit for Creatinine is approximately 13% higher for people identified as African-American.     . Calcium 09/04/2016 8.7  8.6 - 10.4 mg/dL Final  . GFR, Est  African American 09/04/2016 53* >=60 mL/min Final  . GFR, Est Non African American 09/04/2016 46* >=60 mL/min Final    No results found.   Assessment/Plan   ICD-10-CM   1. Abnormal urine odor R82.90   2. CKD (chronic kidney disease), stage II N18.2 CMP with eGFR  3. Mixed Alzheimer's and vascular dementia G30.9    F01.50    F02.80   4. Failure to thrive in adult R62.7   5. Mixed hyperlipidemia E78.2 Lipid Panel    TSH  6. Essential hypertension I10   7. Chronic diastolic heart failure, NYHA class 1 (HCC) I50.32   8. Protein-calorie malnutrition, severe E43 CMP with eGFR   Continue current medications as ordered  Will call with lab results  Follow up in 3 mos for HTN, CKD, dementia   Deziray Nabi S. Ancil Linsey  [facility] [address] [place], [place] [address] [phone] Cell (Monday-Friday 8 AM - 5 PM) [phone] After 5 PM and follow prompts

## 2016-10-05 NOTE — Patient Instructions (Signed)
Continue current medications as ordered  Will call with lab results  Follow up in 3 mos for HTN, CKD, dementia

## 2016-10-06 LAB — TSH: TSH: 0.92 mIU/L

## 2016-10-24 ENCOUNTER — Encounter: Payer: Self-pay | Admitting: Podiatry

## 2016-10-24 ENCOUNTER — Ambulatory Visit (INDEPENDENT_AMBULATORY_CARE_PROVIDER_SITE_OTHER): Payer: Medicare Other | Admitting: Podiatry

## 2016-10-24 DIAGNOSIS — M79676 Pain in unspecified toe(s): Secondary | ICD-10-CM | POA: Diagnosis not present

## 2016-10-24 DIAGNOSIS — B351 Tinea unguium: Secondary | ICD-10-CM

## 2016-10-25 NOTE — Progress Notes (Signed)
Patient ID: Stacey Fernandez, female   DOB: Aug 24, 1929, 83 y.o.   MRN: [id number]   Subjective: This patient presents today requesting trimming of her toenails. Patient not able to give history of the chief complaint  Patient seems concerned primarily with the left hallux toenail. (Patient has difficulty responding to direct questioning) Patient's last visit for similar service was on 10/26/2014  Objective: Patient has difficulty responding to direct questioning, is non-orientated 3   Vascular: Pitting peripheral edema  bilaterally DP pulses 2/4 bilaterally PT pulses 1/4 bilaterally  Neurological: Ankle reflex reactive bilaterally Sensation to 10 g monofilament wire patient not able to respond  Sensation to tuning fork patient not able to respond  Dermatological: No open skin lesions bilaterally The toenails are elongated, discolored with occasional texture and color changes 6-10  Musculoskeletal: HAV deformities bilaterally Hammertoe second right Is no restriction ankle, subtalar, midtarsal joints bilaterally  Assessment: Confused non-orientated patient Symptomatic mycotic toenails 6-10  Plan: Debridement toenails 6-10 mechanically and electrically without any bleeding  Reappoint at patient's request

## 2016-11-15 ENCOUNTER — Other Ambulatory Visit: Payer: Self-pay | Admitting: Nurse Practitioner

## 2016-11-15 DIAGNOSIS — E2839 Other primary ovarian failure: Secondary | ICD-10-CM

## 2016-11-19 ENCOUNTER — Ambulatory Visit
Admission: RE | Admit: 2016-11-19 | Discharge: 2016-11-19 | Disposition: A | Discharge status: Home / Self Care | Payer: Medicare Other | Source: Ambulatory Visit | Attending: Nurse Practitioner | Admitting: Nurse Practitioner

## 2016-11-19 DIAGNOSIS — E2839 Other primary ovarian failure: Secondary | ICD-10-CM

## 2016-11-21 ENCOUNTER — Telehealth: Payer: Self-pay

## 2016-11-21 MED ORDER — ALENDRONATE SODIUM 70 MG PO TABS: 70.0000 mg | ORAL_TABLET | ORAL | 6 refills | Status: DC

## 2016-11-21 NOTE — Telephone Encounter (Signed)
-----   Message from Lauree Chandler, NP sent at 11/21/2016  1:04 PM EDT ----- Bone density came back with osteoporosis. Recommended to take caltrate with D 600/400 twice a day.  Would also recommend starting medication for this to help the bones; fosamax 70 mg by mouth weekly.

## 2016-11-21 NOTE — Telephone Encounter (Signed)
Patient's medication list has been updated to reflect provider recommendations based on bone density results. Rx for fosamax was sent to pharmacy electronically.

## 2016-12-03 ENCOUNTER — Other Ambulatory Visit: Payer: Self-pay | Admitting: Internal Medicine

## 2016-12-10 ENCOUNTER — Telehealth: Payer: Self-pay | Admitting: *Deleted

## 2016-12-10 MED ORDER — FUROSEMIDE 20 MG PO TABS: ORAL_TABLET | ORAL | 0 refills | Status: DC

## 2016-12-10 NOTE — Telephone Encounter (Signed)
Ok to fill x 1 month with no RF. Will d/w pt at next OV as med was d/c'd in Feb 2018 at hospital d/c.

## 2016-12-10 NOTE — Telephone Encounter (Signed)
Medication list updated and Rx faxed to pharmacy.

## 2016-12-10 NOTE — Telephone Encounter (Signed)
Received fax Refill Request from [facility] requesting Refill on Furosemide 20mg  Take 0.5 by mouth daily. Medication is not in patient's current medication list. Is this ok to add and refill? Please Advise.

## 2016-12-18 ENCOUNTER — Telehealth: Payer: Self-pay | Admitting: *Deleted

## 2016-12-18 NOTE — Telephone Encounter (Signed)
Millia, Caregiver called requesting a renewal on patient's Handicap Placard. Filled out and printed last OV note and placed in Dr. Vale Haven folder to review and sign.

## 2016-12-19 NOTE — Telephone Encounter (Signed)
Handicap Placard form filled out and signed by Dr. Eulas Post. Copy made and sent for scanning. Left up front for pick up.

## 2017-01-08 ENCOUNTER — Ambulatory Visit (INDEPENDENT_AMBULATORY_CARE_PROVIDER_SITE_OTHER): Payer: Medicare Other | Admitting: Internal Medicine

## 2017-01-08 ENCOUNTER — Encounter: Payer: Self-pay | Admitting: Internal Medicine

## 2017-01-08 VITALS — BP 118/88 | HR 66 | Resp 20 | Ht 60.0 in | Wt 91.8 lb

## 2017-01-08 DIAGNOSIS — E782 Mixed hyperlipidemia: Secondary | ICD-10-CM

## 2017-01-08 DIAGNOSIS — Z23 Encounter for immunization: Secondary | ICD-10-CM | POA: Diagnosis not present

## 2017-01-08 DIAGNOSIS — I5032 Chronic diastolic (congestive) heart failure: Secondary | ICD-10-CM

## 2017-01-08 DIAGNOSIS — G309 Alzheimer's disease, unspecified: Secondary | ICD-10-CM | POA: Diagnosis not present

## 2017-01-08 DIAGNOSIS — I10 Essential (primary) hypertension: Secondary | ICD-10-CM

## 2017-01-08 DIAGNOSIS — R627 Adult failure to thrive: Secondary | ICD-10-CM | POA: Diagnosis not present

## 2017-01-08 DIAGNOSIS — F0280 Dementia in other diseases classified elsewhere without behavioral disturbance: Secondary | ICD-10-CM

## 2017-01-08 DIAGNOSIS — F0150 Vascular dementia without behavioral disturbance: Secondary | ICD-10-CM | POA: Diagnosis not present

## 2017-01-08 MED ORDER — MEMANTINE HCL ER 28 MG PO CP24: 28.0000 mg | ORAL_CAPSULE | Freq: Every day | ORAL | 5 refills | Status: AC

## 2017-01-08 NOTE — Progress Notes (Signed)
Patient ID: Stacey Fernandez, female   DOB: February 28, 1930, 83 y.o.   MRN: [id number]    Location:  [facility] Place of Service: OFFICE  Chief Complaint  Patient presents with  . Medical Management of Chronic Issues    3 mo f/u for HTN, CKD, Dementia    HPI:  83 yo female seen today for f/u. She has no concerns. Weight down 1 lb. Appetite ok. Sleeps well. Mostly sedentary. She sustained a bruise on right anterior ankle last weekend. She is a poor historian due to dementia. Hx btained from chart  Dementia/anxiety/FTT/protein calorie malnutrition - takes namenda xr and remeron. No behavior disturbances. Weight 92 lbs (up from 91 lbs). She refuses ensure/boost supplements. Albumin 3.6. Na 143.  Hyperlipidemia - stable on lipitor. LDL 85  COPD/hx lung cancer- released from oncology last year as she has not had recurrence of tumor.   HTN/edema - stable on 1/2 tab of lasix. Also takes ASA daily  Constipation - stable. BM q3days  Glaucoma - stable on eye gtts. followed by eye specialist   Past Medical History:  Diagnosis Date  . Anxiety state, unspecified   . Arthritis    "hands, feet" (09/13/2015)  . Chronic airway obstruction, not elsewhere classified   . Chronic kidney disease (CKD), stage II (mild)    Hattie Perch 09/13/2015  . COPD (chronic obstructive pulmonary disease) (HCC)   . Dementia   . Functional constipation    Hattie Perch 09/13/2015  . Hemiplegia affecting dominant side, late effect of cerebrovascular disease   . Hyperlipidemia   . Lung cancer (HCC)   . Lung mass   . Memory loss   . Mixed Alzheimer's and vascular dementia    /notes 09/13/2015  . Other malaise and fatigue   . Protein calorie malnutrition (HCC)    Hattie Perch 09/13/2015  . Sacral decubitus ulcer    chronic; stage IV/notes 09/13/2015  . Senile dementia with delirium   . Urinary frequency     Past Surgical History:  Procedure Laterality Date  . CATARACT EXTRACTION, BILATERAL    . mediastinal lymp node dissection   01/10/2011   VAN TRIGT  . WEDGE RESECTION RUL NODULE  01/10/2011   VAN TRIGT    Patient Care Team: Kirt Boys, DO as PCP - General (Internal Medicine) Jeri Cos, MD (Inactive) (Internal Medicine) Donata Clay, Theron Arista, MD (Cardiothoracic Surgery)  Social History   Social History  . Marital status: Divorced    Spouse name: N/A  . Number of children: N/A  . Years of education: N/A   Occupational History  . Not on file.   Social History Main Topics  . Smoking status: Former Smoker    Packs/day: 0.50    Years: 20.00    Types: Cigarettes    Quit date: 06/09/2010  . Smokeless tobacco: Never Used  . Alcohol use No  . Drug use: No  . Sexual activity: No   Other Topics Concern  . Not on file   Social History Narrative  . No narrative on file     reports that she quit smoking about 6 years ago. Her smoking use included Cigarettes. She has a 10.00 pack-year smoking history. She has never used smokeless tobacco. She reports that she does not drink alcohol or use drugs.  Family History  Problem Relation Age of Onset  . Heart disease Mother   . Cancer Father   . Heart disease Sister   . Heart disease Brother    Family Status  Relation Status  .  Mother Deceased  . Father Deceased  . Sister Deceased  . Brother Deceased  . Sister Alive       70  . Brother Alive       60  . Sister Alive       60  . Brother Alive       67  . Brother Alive       42  . Sister Deceased at age 58       blood clot  . Brother Alive       57  . Sister Deceased at age 22       blood clot   . Son Deceased       at birth     No Known Allergies  Medications: Patient's Medications  New Prescriptions   No medications on file  Previous Medications   ALENDRONATE (FOSAMAX) 70 MG TABLET    Take 1 tablet (70 mg total) by mouth once a week. Take with a full glass of water on an empty stomach.   ASPIRIN EC 81 MG TABLET    Take 81 mg by mouth at bedtime.   ATORVASTATIN (LIPITOR) 10  MG TABLET    TAKE 1 TABLET BY MOUTH EVERY DAY   CALCIUM CARBONATE-VITAMIN D (CALTRATE 600+D) 600-400 MG-UNIT TABLET    Take 1 tablet by mouth 2 (two) times daily.   FUROSEMIDE (LASIX) 20 MG TABLET    Take 0.5 tablet (10mg ) by mouth once daily   LATANOPROST (XALATAN) 0.005 % OPHTHALMIC SOLUTION    PLACE 1 DROP INTO BOTH EYES AT BEDTIME.   MEMANTINE (NAMENDA XR) 28 MG CP24 24 HR CAPSULE    TAKE ONE CAPSULE BY MOUTH EVERY DAY   MIRTAZAPINE (REMERON) 15 MG TABLET    TAKE 1 TABLET (15 MG TOTAL) BY MOUTH AT BEDTIME.   SODIUM CHLORIDE (OCEAN) 0.65 % SOLN NASAL SPRAY    Place 1 spray into both nostrils as needed for congestion.   TRUSOPT 2 % OPHTHALMIC SOLUTION    Place 2 drops into the right eye 2 (two) times daily.   Modified Medications   No medications on file  Discontinued Medications   No medications on file    Review of Systems  Unable to perform ROS: Dementia (nonverbal)    Vitals:   01/08/17 1153  BP: 118/88  Pulse: 66  Resp: 20  Weight: 91 lb 12.8 oz (41.6 kg)  Height: 5' (1.524 m)   Body mass index is 17.93 kg/m.  Physical Exam  Constitutional: She appears well-developed.  Frail appearing in NAD  HENT:  Mouth/Throat: Oropharynx is clear and moist. No oropharyngeal exudate.  MMM; no oral thrush  Eyes: Pupils are equal, round, and reactive to light. No scleral icterus.  Neck: Neck supple. Carotid bruit is not present. No tracheal deviation present. No thyromegaly present.  Cardiovascular: Normal rate, regular rhythm, normal heart sounds and intact distal pulses.  Exam reveals no gallop and no friction rub.   No murmur heard. No LE edema b/l. no calf TTP.   Pulmonary/Chest: Effort normal and breath sounds normal. No stridor. No respiratory distress. She has no wheezes. She has no rales.  Abdominal: Soft. Normal appearance and bowel sounds are normal. She exhibits no distension and no mass. There is no hepatomegaly. There is no tenderness. There is no rigidity, no rebound and  no guarding. No hernia.  Musculoskeletal: She exhibits edema.  Lymphadenopathy:    She has no cervical adenopathy.  Neurological: She is alert.  Skin: Skin is warm and dry. No rash noted.  Psychiatric: She has a normal mood and affect. Her behavior is normal.     Labs reviewed: No visits with results within 3 Month(s) from this visit.  Latest known visit with results is:  Office Visit on 10/05/2016  Component Date Value Ref Range Status  . Sodium 10/05/2016 143  135 - 146 mmol/L Final  . Potassium 10/05/2016 4.4  3.5 - 5.3 mmol/L Final  . Chloride 10/05/2016 110  98 - 110 mmol/L Final  . CO2 10/05/2016 24  20 - 31 mmol/L Final  . Glucose, Bld 10/05/2016 81  65 - 99 mg/dL Final  . BUN 72/53/6644 19  7 - 25 mg/dL Final  . Creat 03/47/4259 0.92* 0.60 - 0.88 mg/dL Final   Comment:   For patients > or = 81 years of age: The upper reference limit for Creatinine is approximately 13% higher for people identified as African-American.     . Total Bilirubin 10/05/2016 0.4  0.2 - 1.2 mg/dL Final  . Alkaline Phosphatase 10/05/2016 89  33 - 130 U/L Final  . AST 10/05/2016 35  10 - 35 U/L Final  . ALT 10/05/2016 36* 6 - 29 U/L Final  . Total Protein 10/05/2016 6.5  6.1 - 8.1 g/dL Final  . Albumin 56/38/7564 3.6  3.6 - 5.1 g/dL Final  . Calcium 33/29/5188 8.6  8.6 - 10.4 mg/dL Final  . GFR, Est African American 10/05/2016 66  >=60 mL/min Final  . GFR, Est Non African American 10/05/2016 57* >=60 mL/min Final  . Cholesterol 10/05/2016 149  <200 mg/dL Final  . Triglycerides 10/05/2016 75  <150 mg/dL Final  . HDL 41/66/0630 49* >50 mg/dL Final  . Total CHOL/HDL Ratio 10/05/2016 3.0  <1.6 Ratio Final  . VLDL 10/05/2016 15  <30 mg/dL Final  . LDL Cholesterol 10/05/2016 85  <100 mg/dL Final  . TSH 04/24/3233 0.92  mIU/L Final   Comment:   Reference Range   > or = 20 Years  0.40-4.50   Pregnancy Range First trimester  0.26-2.66 Second trimester 0.55-2.73 Third trimester  0.43-2.91         No results found.   Assessment/Plan   ICD-10-CM   1. Mixed Alzheimer's and vascular dementia G30.9    F01.50    F02.80   2. Failure to thrive in adult R62.7   3. Mixed hyperlipidemia E78.2   4. Essential hypertension I10   5. Chronic diastolic heart failure, NYHA class 1 (HCC) I50.32   6. Need for immunization against influenza Z23 Flu Vaccine QUAD 6+ mos PF IM (Fluarix Quad PF)   Continue current medications as ordered  Influenza vaccine given today  Follow up in 3 mos for HTN, dementia    Seann Genther S. Ancil Linsey  [facility] [address] [place], [place] [address] [phone] Cell (Monday-Friday 8 AM - 5 PM) [phone] After 5 PM and follow prompts

## 2017-01-08 NOTE — Patient Instructions (Signed)
Continue current medications as ordered  Influenza vaccine given today  Follow up in 3 mos for HTN, dementia

## 2017-01-14 DIAGNOSIS — Z029 Encounter for administrative examinations, unspecified: Secondary | ICD-10-CM

## 2017-02-05 ENCOUNTER — Other Ambulatory Visit: Payer: Self-pay | Admitting: Internal Medicine

## 2017-02-21 ENCOUNTER — Other Ambulatory Visit: Payer: Self-pay | Admitting: Internal Medicine

## 2017-02-21 DIAGNOSIS — R627 Adult failure to thrive: Secondary | ICD-10-CM

## 2017-02-21 DIAGNOSIS — E43 Unspecified severe protein-calorie malnutrition: Secondary | ICD-10-CM

## 2017-04-02 ENCOUNTER — Telehealth: Payer: Self-pay | Admitting: Internal Medicine

## 2017-04-02 NOTE — Telephone Encounter (Signed)
Son dropped off paperwork for long term care for patient to be completed. Asked to be given to [place], he stated she was expecting it. Gave paperwork to [facility] for review

## 2017-04-03 IMAGING — CT CT HEAD W/O CM
3 of 8 series · 14 of 47 positions shown, 17 images · non-contrast
Comparison: CT scan of head September 13, 2015.

CLINICAL DATA: Posterior scalp hematoma after being found on floor.
Dementia.

EXAM:
CT HEAD WITHOUT CONTRAST
CT CERVICAL SPINE WITHOUT CONTRAST
TECHNIQUE: Multidetector CT imaging of the head and cervical spine was
performed following the standard protocol without intravenous
contrast. Multiplanar CT image reconstructions of the cervical spine
were also generated.

[Series 7: coronal · coronal · 0.29mm/px · 3 of 73 slices shown]
[im 21/73  brain]
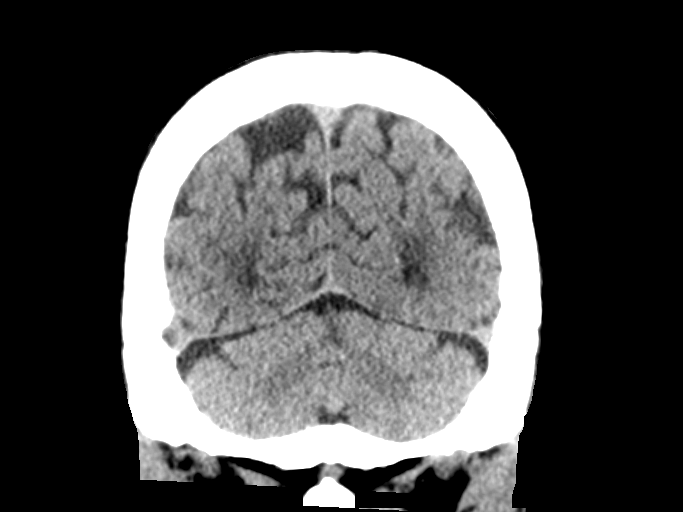
[im 31/73  brain]
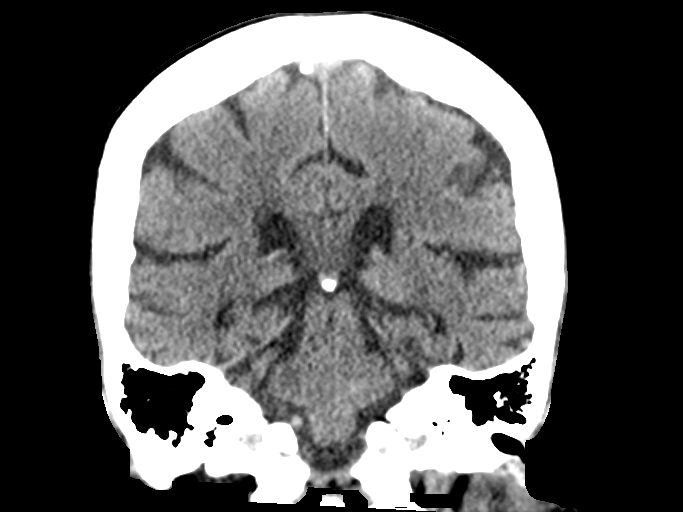
[im 42/73  brain]
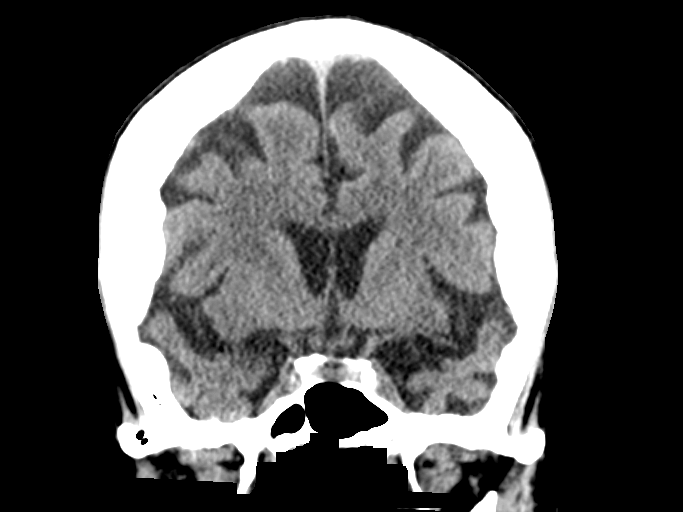

[Series 8: sagittal · sagittal · 0.30mm/px · 2 of 73 slices shown]
[im 25/73  brain]
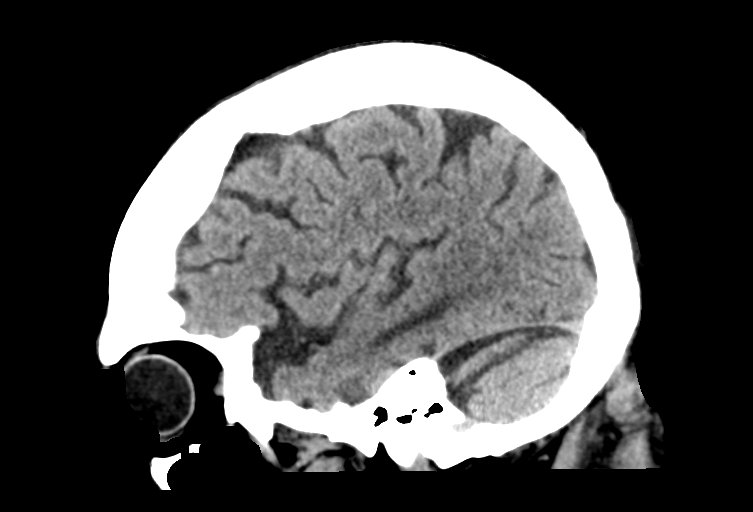
[im 49/73  brain]
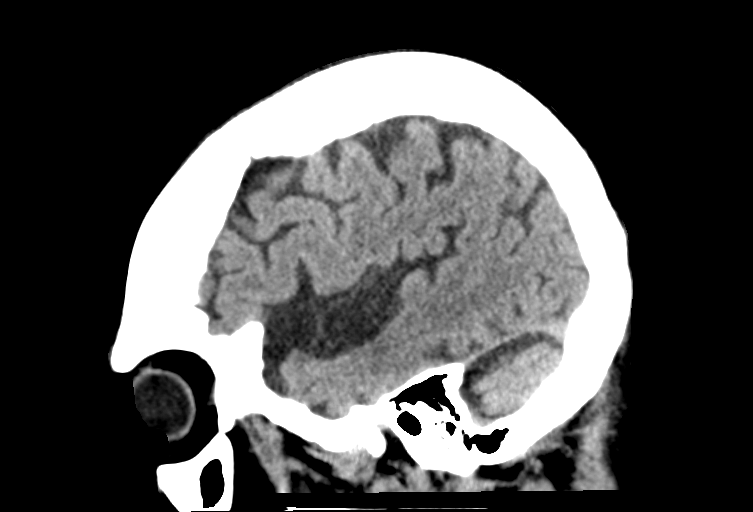

[Series 11: axial recon · axial · 0.23mm/px · z∈[-240,-103]mm · 9 of 103 slices shown, 12 images]
[im 11/103  brain]
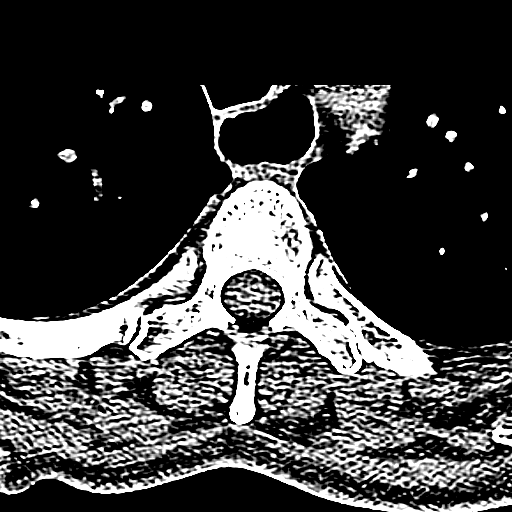
[im 11/103  bone]
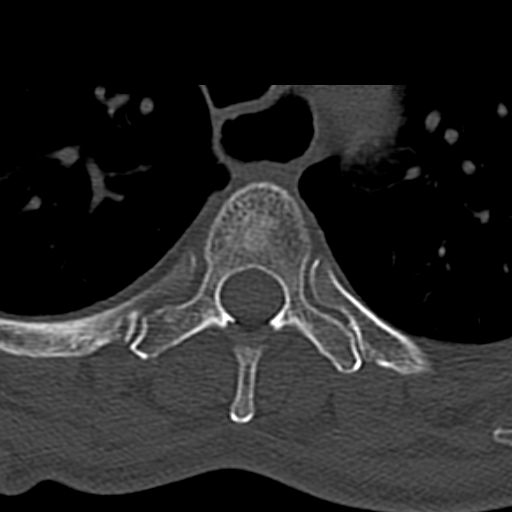
[im 21/103  brain]
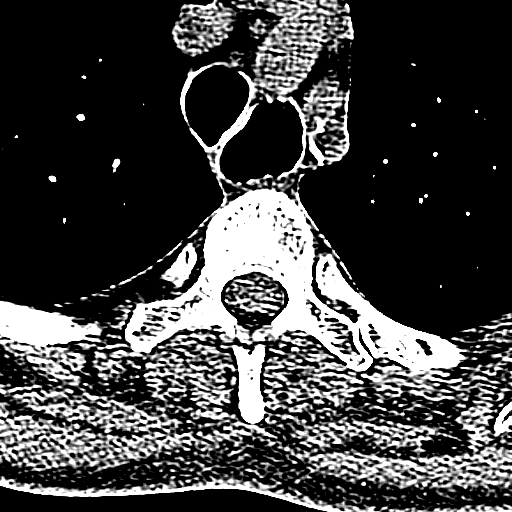
[im 31/103  brain]
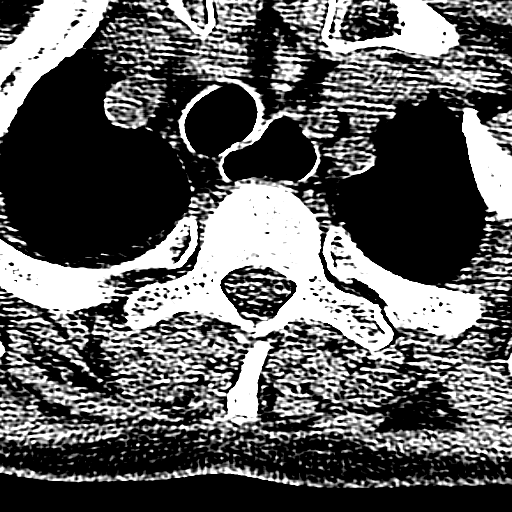
[im 41/103  brain]
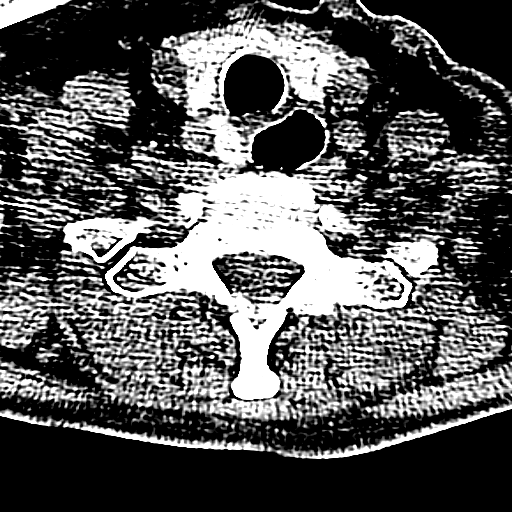
[im 52/103  brain]
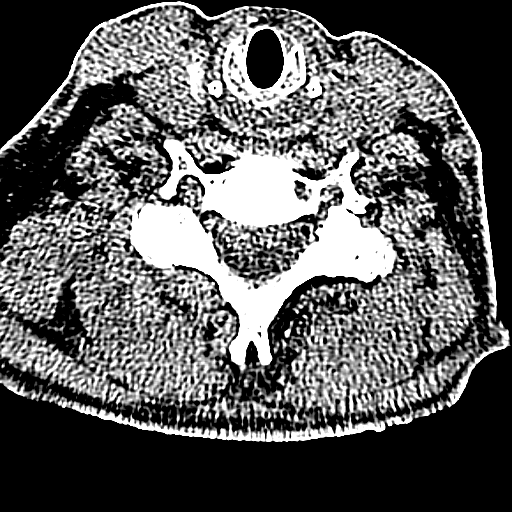
[im 52/103  bone]
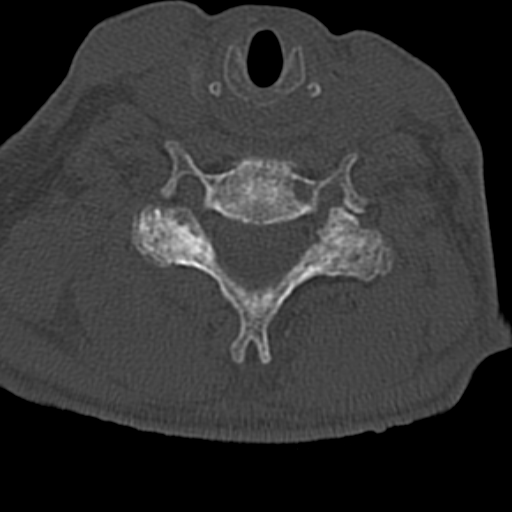
[im 62/103  brain]
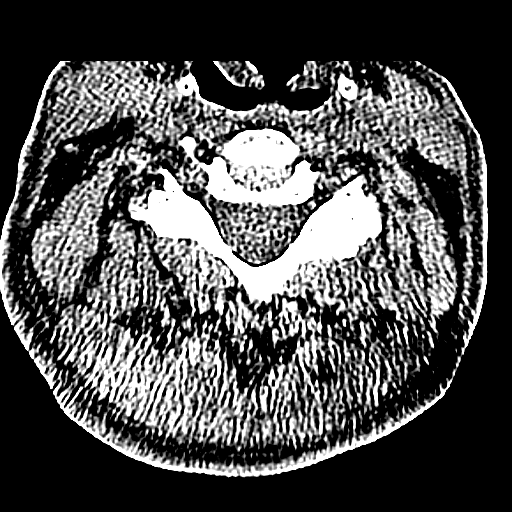
[im 72/103  brain]
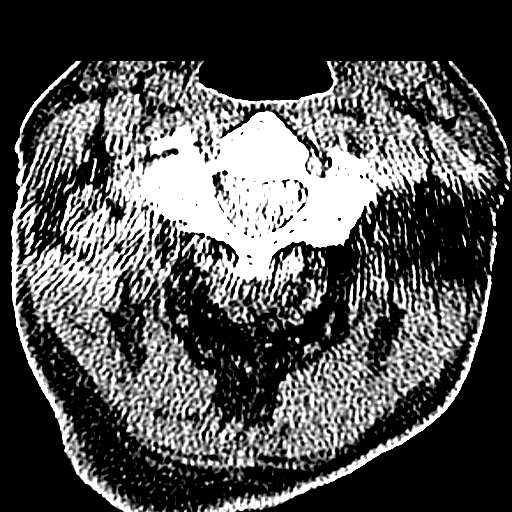
[im 82/103  brain]
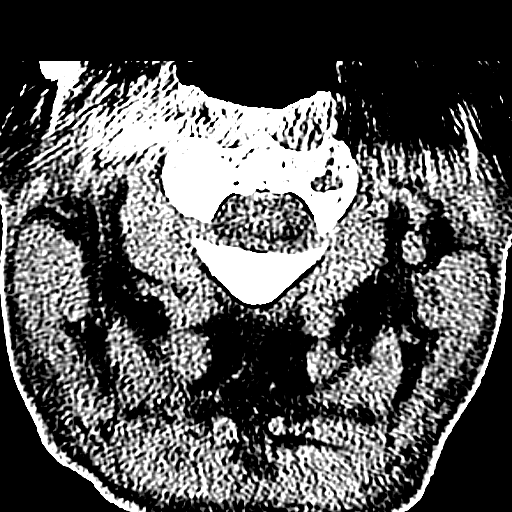
[im 92/103  brain]
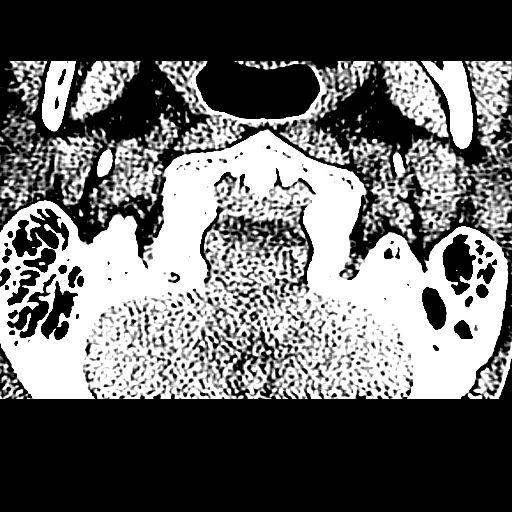
[im 92/103  bone]
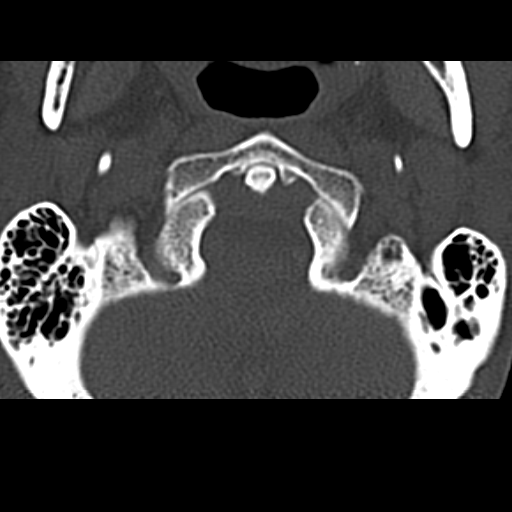

[14 of 47 positions shown; findings below may reference images not displayed]

FINDINGS: CT HEAD FINDINGS

Bony calvarium appears intact. Mild diffuse cortical atrophy is
noted. Mild chronic ischemic white matter disease is noted. No mass
effect or midline shift is noted. Ventricular size is within normal
limits. There is no evidence of mass lesion, hemorrhage or acute
infarction.

CT CERVICAL SPINE FINDINGS

No fracture is noted. Grade 1 anterolisthesis of C4-5 is noted
secondary to posterior facet joint hypertrophy. Moderate
degenerative disc disease is noted at C5-6. Visualized lung apices
are unremarkable.
IMPRESSION: Mild diffuse cortical atrophy. Mild chronic ischemic white matter
disease. No acute intracranial abnormality seen.

Moderate degenerative disc disease is noted at C5-6. No acute
abnormality seen in the cervical spine.

## 2017-04-17 ENCOUNTER — Ambulatory Visit: Payer: Medicare Other | Admitting: Internal Medicine

## 2017-04-29 ENCOUNTER — Inpatient Hospital Stay (HOSPITAL_COMMUNITY)
Admission: EM | Admit: 2017-04-29 | Discharge: 2017-05-04 | DRG: 689 | Disposition: A | Discharge status: Home / Self Care | Payer: Medicare Other | Attending: Family Medicine | Admitting: Family Medicine

## 2017-04-29 ENCOUNTER — Encounter (HOSPITAL_COMMUNITY): Payer: Self-pay | Admitting: Emergency Medicine

## 2017-04-29 ENCOUNTER — Telehealth: Payer: Self-pay | Admitting: *Deleted

## 2017-04-29 ENCOUNTER — Other Ambulatory Visit: Payer: Self-pay

## 2017-04-29 DIAGNOSIS — D696 Thrombocytopenia, unspecified: Secondary | ICD-10-CM | POA: Diagnosis present

## 2017-04-29 DIAGNOSIS — E860 Dehydration: Secondary | ICD-10-CM | POA: Diagnosis present

## 2017-04-29 DIAGNOSIS — N3000 Acute cystitis without hematuria: Secondary | ICD-10-CM | POA: Diagnosis present

## 2017-04-29 DIAGNOSIS — Z79899 Other long term (current) drug therapy: Secondary | ICD-10-CM

## 2017-04-29 DIAGNOSIS — N3001 Acute cystitis with hematuria: Secondary | ICD-10-CM

## 2017-04-29 DIAGNOSIS — H409 Unspecified glaucoma: Secondary | ICD-10-CM | POA: Diagnosis present

## 2017-04-29 DIAGNOSIS — E870 Hyperosmolality and hypernatremia: Secondary | ICD-10-CM | POA: Diagnosis not present

## 2017-04-29 DIAGNOSIS — M1990 Unspecified osteoarthritis, unspecified site: Secondary | ICD-10-CM | POA: Diagnosis present

## 2017-04-29 DIAGNOSIS — I129 Hypertensive chronic kidney disease with stage 1 through stage 4 chronic kidney disease, or unspecified chronic kidney disease: Secondary | ICD-10-CM | POA: Diagnosis present

## 2017-04-29 DIAGNOSIS — Z515 Encounter for palliative care: Secondary | ICD-10-CM | POA: Diagnosis not present

## 2017-04-29 DIAGNOSIS — F0150 Vascular dementia without behavioral disturbance: Secondary | ICD-10-CM | POA: Diagnosis present

## 2017-04-29 DIAGNOSIS — E43 Unspecified severe protein-calorie malnutrition: Secondary | ICD-10-CM | POA: Diagnosis present

## 2017-04-29 DIAGNOSIS — K5904 Chronic idiopathic constipation: Secondary | ICD-10-CM | POA: Diagnosis present

## 2017-04-29 DIAGNOSIS — R140 Abdominal distension (gaseous): Secondary | ICD-10-CM | POA: Diagnosis not present

## 2017-04-29 DIAGNOSIS — G9341 Metabolic encephalopathy: Secondary | ICD-10-CM | POA: Diagnosis present

## 2017-04-29 DIAGNOSIS — R4182 Altered mental status, unspecified: Secondary | ICD-10-CM | POA: Diagnosis present

## 2017-04-29 DIAGNOSIS — Z8249 Family history of ischemic heart disease and other diseases of the circulatory system: Secondary | ICD-10-CM | POA: Diagnosis not present

## 2017-04-29 DIAGNOSIS — F411 Generalized anxiety disorder: Secondary | ICD-10-CM | POA: Diagnosis present

## 2017-04-29 DIAGNOSIS — G309 Alzheimer's disease, unspecified: Secondary | ICD-10-CM | POA: Diagnosis present

## 2017-04-29 DIAGNOSIS — J449 Chronic obstructive pulmonary disease, unspecified: Secondary | ICD-10-CM | POA: Diagnosis present

## 2017-04-29 DIAGNOSIS — R9401 Abnormal electroencephalogram [EEG]: Secondary | ICD-10-CM | POA: Diagnosis present

## 2017-04-29 DIAGNOSIS — Z87891 Personal history of nicotine dependence: Secondary | ICD-10-CM | POA: Diagnosis not present

## 2017-04-29 DIAGNOSIS — Z87440 Personal history of urinary (tract) infections: Secondary | ICD-10-CM

## 2017-04-29 DIAGNOSIS — Z85118 Personal history of other malignant neoplasm of bronchus and lung: Secondary | ICD-10-CM | POA: Diagnosis not present

## 2017-04-29 DIAGNOSIS — F0280 Dementia in other diseases classified elsewhere without behavioral disturbance: Secondary | ICD-10-CM

## 2017-04-29 DIAGNOSIS — Z809 Family history of malignant neoplasm, unspecified: Secondary | ICD-10-CM

## 2017-04-29 DIAGNOSIS — N390 Urinary tract infection, site not specified: Secondary | ICD-10-CM

## 2017-04-29 DIAGNOSIS — Z7982 Long term (current) use of aspirin: Secondary | ICD-10-CM

## 2017-04-29 DIAGNOSIS — N183 Chronic kidney disease, stage 3 (moderate): Secondary | ICD-10-CM | POA: Diagnosis present

## 2017-04-29 DIAGNOSIS — Z681 Body mass index (BMI) 19 or less, adult: Secondary | ICD-10-CM

## 2017-04-29 DIAGNOSIS — E876 Hypokalemia: Secondary | ICD-10-CM

## 2017-04-29 DIAGNOSIS — L89153 Pressure ulcer of sacral region, stage 3: Secondary | ICD-10-CM | POA: Diagnosis present

## 2017-04-29 DIAGNOSIS — Z7189 Other specified counseling: Secondary | ICD-10-CM | POA: Diagnosis not present

## 2017-04-29 DIAGNOSIS — I69951 Hemiplegia and hemiparesis following unspecified cerebrovascular disease affecting right dominant side: Secondary | ICD-10-CM

## 2017-04-29 DIAGNOSIS — E785 Hyperlipidemia, unspecified: Secondary | ICD-10-CM | POA: Diagnosis present

## 2017-04-29 DIAGNOSIS — Z66 Do not resuscitate: Secondary | ICD-10-CM | POA: Diagnosis present

## 2017-04-29 DIAGNOSIS — F0390 Unspecified dementia without behavioral disturbance: Secondary | ICD-10-CM | POA: Diagnosis not present

## 2017-04-29 LAB — URINALYSIS, ROUTINE W REFLEX MICROSCOPIC
Bilirubin Urine: NEGATIVE
GLUCOSE, UA: NEGATIVE mg/dL
Ketones, ur: 5 mg/dL — AB
Leukocytes, UA: NEGATIVE
NITRITE: NEGATIVE
PH: 8 (ref 5.0–8.0)
Protein, ur: 100 mg/dL — AB
SPECIFIC GRAVITY, URINE: 1.015 (ref 1.005–1.030)

## 2017-04-29 LAB — CBC WITH DIFFERENTIAL/PLATELET
Basophils Absolute: 0 10*3/uL (ref 0.0–0.1)
Basophils Relative: 0 %
Eosinophils Absolute: 0.1 10*3/uL (ref 0.0–0.7)
Eosinophils Relative: 1 %
HCT: 46.4 % — ABNORMAL HIGH (ref 36.0–46.0)
Hemoglobin: 14.4 g/dL (ref 12.0–15.0)
Lymphocytes Relative: 13 %
Lymphs Abs: 1.4 10*3/uL (ref 0.7–4.0)
MCH: 29.7 pg (ref 26.0–34.0)
MCHC: 31 g/dL (ref 30.0–36.0)
MCV: 95.7 fL (ref 78.0–100.0)
Monocytes Absolute: 0.8 10*3/uL (ref 0.1–1.0)
Monocytes Relative: 8 %
Neutro Abs: 8.1 10*3/uL — ABNORMAL HIGH (ref 1.7–7.7)
Neutrophils Relative %: 78 %
Platelets: 105 10*3/uL — ABNORMAL LOW (ref 150–400)
RBC: 4.85 MIL/uL (ref 3.87–5.11)
RDW: 16.8 % — ABNORMAL HIGH (ref 11.5–15.5)
WBC: 10.3 10*3/uL (ref 4.0–10.5)

## 2017-04-29 LAB — BASIC METABOLIC PANEL
Anion gap: 7 (ref 5–15)
BUN: 27 mg/dL — ABNORMAL HIGH (ref 6–20)
CO2: 29 mmol/L (ref 22–32)
Calcium: 8.3 mg/dL — ABNORMAL LOW (ref 8.9–10.3)
Chloride: 119 mmol/L — ABNORMAL HIGH (ref 101–111)
Creatinine, Ser: 1.12 mg/dL — ABNORMAL HIGH (ref 0.44–1.00)
GFR calc Af Amer: 50 mL/min — ABNORMAL LOW (ref >60)
GFR calc non Af Amer: 44 mL/min — ABNORMAL LOW (ref >60)
Glucose, Bld: 107 mg/dL — ABNORMAL HIGH (ref 65–99)
Potassium: 3.1 mmol/L — ABNORMAL LOW (ref 3.5–5.1)
Sodium: 155 mmol/L — ABNORMAL HIGH (ref 135–145)

## 2017-04-29 MED ORDER — MEMANTINE HCL ER 28 MG PO CP24: 28.0000 mg | ORAL_CAPSULE | Freq: Every day | ORAL | Status: DC

## 2017-04-29 MED ORDER — SALINE SPRAY 0.65 % NA SOLN: 1.0000 | NASAL | Status: DC | PRN

## 2017-04-29 MED ORDER — SODIUM CHLORIDE 0.9 % IV BOLUS (SEPSIS)
500.0000 mL | Freq: Once | INTRAVENOUS | Status: AC
  Administered 2017-04-29: 500 mL via INTRAVENOUS

## 2017-04-29 MED ORDER — DEXTROSE-NACL 5-0.45 % IV SOLN
INTRAVENOUS | Status: DC
  Administered 2017-04-29: 23:00:00 via INTRAVENOUS

## 2017-04-29 MED ORDER — ENOXAPARIN SODIUM 30 MG/0.3ML ~~LOC~~ SOLN
30.0000 mg | SUBCUTANEOUS | Status: DC
  Administered 2017-04-29 – 2017-05-03 (×5): 30 mg via SUBCUTANEOUS
  Filled 2017-04-29 (×5): qty 0.3

## 2017-04-29 MED ORDER — ACETAMINOPHEN 325 MG PO TABS: 650.0000 mg | ORAL_TABLET | Freq: Four times a day (QID) | ORAL | Status: DC | PRN

## 2017-04-29 MED ORDER — CEFTRIAXONE SODIUM 1 G IJ SOLR
1.0000 g | Freq: Once | INTRAMUSCULAR | Status: AC
  Administered 2017-04-29: 1 g via INTRAVENOUS
  Filled 2017-04-29: qty 10

## 2017-04-29 MED ORDER — SODIUM CHLORIDE 0.9 % IV SOLN
INTRAVENOUS | Status: DC
  Administered 2017-04-29: 17:00:00 via INTRAVENOUS

## 2017-04-29 MED ORDER — DEXTROSE 5 % IV SOLN
1.0000 g | INTRAVENOUS | Status: DC
  Administered 2017-04-30 – 2017-05-03 (×4): 1 g via INTRAVENOUS
  Filled 2017-04-29 (×4): qty 10

## 2017-04-29 MED ORDER — ACETAMINOPHEN 650 MG RE SUPP: 650.0000 mg | Freq: Four times a day (QID) | RECTAL | Status: DC | PRN

## 2017-04-29 MED ORDER — DORZOLAMIDE HCL 2 % OP SOLN
2.0000 [drp] | Freq: Two times a day (BID) | OPHTHALMIC | Status: DC
  Administered 2017-04-30 – 2017-05-04 (×9): 2 [drp] via OPHTHALMIC
  Filled 2017-04-29: qty 10

## 2017-04-29 MED ORDER — ATORVASTATIN CALCIUM 10 MG PO TABS: 10.0000 mg | ORAL_TABLET | Freq: Every day | ORAL | Status: DC

## 2017-04-29 MED ORDER — MAGNESIUM SULFATE IN D5W 1-5 GM/100ML-% IV SOLN
1.0000 g | Freq: Once | INTRAVENOUS | Status: AC
  Administered 2017-04-29: 1 g via INTRAVENOUS
  Filled 2017-04-29: qty 100

## 2017-04-29 MED ORDER — POTASSIUM CHLORIDE 10 MEQ/100ML IV SOLN
10.0000 meq | INTRAVENOUS | Status: AC
  Administered 2017-04-29 (×2): 10 meq via INTRAVENOUS
  Filled 2017-04-29 (×2): qty 100

## 2017-04-29 MED ORDER — LATANOPROST 0.005 % OP SOLN
1.0000 [drp] | Freq: Every day | OPHTHALMIC | Status: DC
  Administered 2017-04-30 – 2017-05-03 (×4): 1 [drp] via OPHTHALMIC
  Filled 2017-04-29: qty 2.5

## 2017-04-29 MED ORDER — ASPIRIN EC 81 MG PO TBEC: 81.0000 mg | DELAYED_RELEASE_TABLET | Freq: Every day | ORAL | Status: DC

## 2017-04-29 NOTE — ED Provider Notes (Signed)
[facility] DEPT Provider Note   CSN: [id number] Arrival date & time: 04/29/17  1215     History   Chief Complaint Chief Complaint  Patient presents with  . Altered Mental Status  . Weakness    HPI Stacey Fernandez is a 84 y.o. female.  HPI   84 year old female brought in by her family for evaluation of change in mental status.  Worsening over the past week or so.  She has advanced dementia at baseline and walks with a walker.  In the last week she has been increasingly weak to the point where she has difficulty even standing.  Her appetite has been decreased.  No vomiting or diarrhea.  No reported fever.  No recent medication changes.  Family reports no recent falls but did slide down into a kneeling position and ambulate with her walker a few days ago.  Past Medical History:  Diagnosis Date  . Anxiety state, unspecified   . Arthritis    "hands, feet" (09/13/2015)  . Chronic airway obstruction, not elsewhere classified   . Chronic kidney disease (CKD), stage II (mild)    [name] 09/13/2015  . COPD (chronic obstructive pulmonary disease) ([place])   . Dementia   . Functional constipation    [name] 09/13/2015  . Hemiplegia affecting dominant side, late effect of cerebrovascular disease   . Hyperlipidemia   . Lung cancer ([place])   . Lung mass   . Memory loss   . Mixed Alzheimer's and vascular dementia    /notes 09/13/2015  . Other malaise and fatigue   . Protein calorie malnutrition ([place])    [name] 09/13/2015  . Sacral decubitus ulcer    chronic; stage IV/notes 09/13/2015  . Senile dementia with delirium   . Urinary frequency     Patient Active Problem List   Diagnosis Date Noted  . Failure to thrive in adult 06/06/2016  . Anemia 06/06/2016  . Thrombocytopenia ([place]) 06/06/2016  . Palliative care by specialist   . Acute cystitis without hematuria   . Goals of care, counseling/discussion   . Acute hypernatremia   . Palliative care encounter    . Protein-calorie malnutrition, severe 09/14/2015  . CKD (chronic kidney disease), stage II 09/13/2015  . AKI (acute kidney injury) ([place]) 09/13/2015  . Dehydration with hypernatremia 09/13/2015  . Metabolic encephalopathy 11/27/4816  . Chronic diastolic heart failure, NYHA class 1 ([place]) 09/13/2015  . Diarrhea 09/13/2015  . Altered mental status   . Sacral decubitus ulcer, stage II 09/06/2015  . Mixed Alzheimer's and vascular dementia 12/01/2013  . Essential hypertension 12/01/2013  . Nail hypertrophy 12/01/2013  . Protein-calorie malnutrition, moderate ([place]) 09/08/2013  . Functional constipation 09/08/2013  . Edema 07/28/2013  . Allergic rhinitis 07/28/2013  . Alzheimer's dementia without behavioral disturbance 07/28/2013  . Dyspnea on exertion 02/04/2013  . Impaired renal function 09/23/2012  . Urinary frequency 07/29/2012  . Malignant neoplasm of bronchus and lung, unspecified site 07/29/2012  . Anxiety state, unspecified 07/29/2012  . Primary pulmonary hypertension ([place]) 07/29/2012  . Chronic airway obstruction, not elsewhere classified 07/29/2012  . Other malaise and fatigue 07/29/2012  . Hyperlipidemia     Past Surgical History:  Procedure Laterality Date  . CATARACT EXTRACTION, BILATERAL    . mediastinal lymp node dissection  01/10/2011   VAN TRIGT  . WEDGE RESECTION RUL NODULE  01/10/2011   VAN TRIGT    OB History    No data available       Home Medications  Prior to Admission medications   Medication Sig Start Date End Date Taking? Authorizing Provider  alendronate (FOSAMAX) 70 MG tablet Take 1 tablet (70 mg total) by mouth once a week. Take with a full glass of water on an empty stomach. 11/21/16   Lauree Chandler, NP  aspirin EC 81 MG tablet Take 81 mg by mouth at bedtime.    [provider]  atorvastatin (LIPITOR) 10 MG tablet TAKE 1 TABLET BY MOUTH EVERY DAY 02/05/17   Gildardo Cranker, DO  Calcium Carbonate-Vitamin D (CALTRATE 600+D) 600-400  MG-UNIT tablet Take 1 tablet by mouth 2 (two) times daily.    [provider]  furosemide (LASIX) 20 MG tablet TAKE 0.5 TABLET (10MG ) BY MOUTH ONCE DAILY 02/05/17   Gildardo Cranker, DO  latanoprost (XALATAN) 0.005 % ophthalmic solution PLACE 1 DROP INTO BOTH EYES AT BEDTIME. 02/15/14   Reed, Tiffany L, DO  memantine (NAMENDA XR) 28 MG CP24 24 hr capsule Take 1 capsule (28 mg total) by mouth daily. 01/08/17   Gildardo Cranker, DO  mirtazapine (REMERON) 15 MG tablet TAKE 1 TABLET (15 MG TOTAL) BY MOUTH AT BEDTIME. 02/21/17   Gildardo Cranker, DO  sodium chloride (OCEAN) 0.65 % SOLN nasal spray Place 1 spray into both nostrils as needed for congestion. 07/28/13   Blanchie Serve, MD  TRUSOPT 2 % ophthalmic solution Place 2 drops into the right eye 2 (two) times daily.  08/20/14   [provider]    Family History Family History  Problem Relation Age of Onset  . Heart disease Mother   . Cancer Father   . Heart disease Sister   . Heart disease Brother     Social History Social History   Tobacco Use  . Smoking status: Former Smoker    Packs/day: 0.50    Years: 20.00    Pack years: 10.00    Types: Cigarettes    Last attempt to quit: 06/09/2010    Years since quitting: 6.8  . Smokeless tobacco: Never Used  Substance Use Topics  . Alcohol use: No    Alcohol/week: 0.0 oz  . Drug use: No     Allergies   Patient has no known allergies.   Review of Systems Review of Systems   Level 5 caveat because of dementia Physical Exam Updated Vital Signs BP (!) 143/96 (BP Location: Left Arm)   Pulse 74   Temp 98.3 F (36.8 C) (Rectal) Comment: triage RN collect rectal temp  Resp 10   SpO2 98%   Physical Exam  Constitutional: She appears well-developed and well-nourished. No distress.  Laying in bed.  Frail and cachectic.  No acute distress.  HENT:  Head: Normocephalic and atraumatic.  Eyes: Conjunctivae are normal. Pupils are equal, round, and reactive to light. Right eye  exhibits no discharge. Left eye exhibits no discharge.  Neck: Neck supple.  Cardiovascular: Normal rate, regular rhythm and normal heart sounds. Exam reveals no gallop and no friction rub.  No murmur heard. Pulmonary/Chest: Effort normal and breath sounds normal. No respiratory distress.  Abdominal: Soft. She exhibits no distension. There is no tenderness.  Musculoskeletal: She exhibits no edema or tenderness.  Neurological:  Laying in bed with eyes open. Did not respond to most of my questions but was answering some when family asked. When I told her I was going to examine her she said "go ahead."   Skin: Skin is warm and dry.  Nursing note and vitals reviewed.    ED Treatments /  Results  Labs (all labs ordered are listed, but only abnormal results are displayed) Labs Reviewed  BASIC METABOLIC PANEL - Abnormal; Notable for the following components:      Result Value   Sodium 155 (*)    Potassium 3.1 (*)    Chloride 119 (*)    Glucose, Bld 107 (*)    BUN 27 (*)    Creatinine, Ser 1.12 (*)    Calcium 8.3 (*)    GFR calc non Af Amer 44 (*)    GFR calc Af Amer 50 (*)    All other components within normal limits  URINE CULTURE  URINALYSIS, ROUTINE W REFLEX MICROSCOPIC  CBC WITH DIFFERENTIAL/PLATELET    EKG  EKG Interpretation  Date/Time:  Monday April 29 2017 13:07:54 EST Ventricular Rate:  85 PR Interval:    QRS Duration: 99 QT Interval:  432 QTC Calculation: 514 R Axis:   -70 Text Interpretation:  Sinus rhythm Left anterior fascicular block Low voltage, extremity leads Borderline repolarization abnormality Prolonged QT interval Confirmed by Davonna Belling [phone]) on 04/29/2017 4:22:19 PM       Radiology No results found.  Procedures Procedures (including critical care time)  Medications Ordered in ED Medications  sodium chloride 0.9 % bolus 500 mL (500 mLs Intravenous New Bag/Given 04/29/17 1433)     Initial Impression / Assessment and Plan / ED Course    I have reviewed the triage vital signs and the nursing notes.  Pertinent labs & imaging results that were available during my care of the patient were reviewed by me and considered in my medical decision making (see chart for details).  87yF with decreased mental status. Underlying dementia, but even less responsive than she normally is. Her niece says she gets this way every ~6 mo from either dehydration or UTI.   Final Clinical Impressions(s) / ED Diagnoses   Final diagnoses:  Dehydration  Hypokalemia    ED Discharge Orders    None       Virgel Manifold, MD 04/29/17 1626

## 2017-04-29 NOTE — ED Notes (Signed)
Bed: WA07 Expected date:  Expected time:  Means of arrival:  Comments: Hold for triage

## 2017-04-29 NOTE — ED Notes (Signed)
ED TO INPATIENT HANDOFF REPORT  Name/Age/Gender Stacey Fernandez 84 y.o. female  Code Status Code Status History    Date Active Date Inactive Code Status Order ID Comments User Context   05/28/2016 13:40 06/01/2016 17:58 DNR [id number]  Samella Parr, NP Inpatient   05/28/2016 12:42 05/28/2016 13:40 DNR [id number]  Samella Parr, NP ED   09/13/2015 16:49 09/17/2015 14:13 DNR [id number]  Samella Parr, NP ED   09/13/2015 12:29 09/13/2015 16:49 DNR [id number]  Orlie Dakin, MD ED    Questions for Most Recent Historical Code Status (Order [id number])    Question Answer Comment   In the event of cardiac or respiratory ARREST Do not call a "code blue"    In the event of cardiac or respiratory ARREST Do not perform Intubation, CPR, defibrillation or ACLS    In the event of cardiac or respiratory ARREST Use medication by any route, position, wound care, and other measures to relive pain and suffering. May use oxygen, suction and manual treatment of airway obstruction as needed for comfort.         Advance Directive Documentation     Most Recent Value  Type of Advance Directive  Healthcare Power of Attorney, Living will  Pre-existing out of facility DNR order (yellow form or pink MOST form)  No data  "MOST" Form in Place?  No data      Home/SNF/Other Home  Chief Complaint ams  Level of Care/Admitting Diagnosis ED Disposition    ED Disposition Condition Comment   Admit  Hospital Area: [facility] [[id number]]  Level of Care: Telemetry [5]  Admit to tele based on following criteria: Monitor for Ischemic changes  Diagnosis: UTI (urinary tract infection) [165790]  Admitting Physician: Jani Gravel [[id number]]  Attending Physician: Jani Gravel [phone]  Estimated length of stay: past midnight tomorrow  Certification:: I certify this patient will need inpatient services for at least 2 midnights  PT Class (Do Not Modify): Inpatient [101]  PT Acc Code (Do Not Modify): Private  [1]       Medical History Past Medical History:  Diagnosis Date  . Anxiety state, unspecified   . Arthritis    "hands, feet" (09/13/2015)  . Chronic airway obstruction, not elsewhere classified   . Chronic kidney disease (CKD), stage II (mild)    [name] 09/13/2015  . COPD (chronic obstructive pulmonary disease) ([place])   . Dementia   . Functional constipation    [name] 09/13/2015  . Hemiplegia affecting dominant side, late effect of cerebrovascular disease   . Hyperlipidemia   . Lung cancer ([place])   . Lung mass   . Memory loss   . Mixed Alzheimer's and vascular dementia    /notes 09/13/2015  . Other malaise and fatigue   . Protein calorie malnutrition ([place])    [name] 09/13/2015  . Sacral decubitus ulcer    chronic; stage IV/notes 09/13/2015  . Senile dementia with delirium   . Urinary frequency     Allergies No Known Allergies  IV Location/Drains/Wounds Patient Lines/Drains/Airways Status   Active Line/Drains/Airways    Name:   Placement date:   Placement time:   Site:   Days:   Peripheral IV 04/29/17 Left Hand   04/29/17    1642    Hand   less than 1          Labs/Imaging Results for orders placed or performed during the hospital encounter of 04/29/17 (from the past 48 hour(s))  Urinalysis, Routine w reflex  microscopic- may I&O cath if menses     Status: Abnormal   Collection Time: 04/29/17  1:15 PM  Result Value Ref Range   Color, Urine RED (A) YELLOW    Comment: BIOCHEMICALS MAY BE AFFECTED BY COLOR   APPearance CLOUDY (A) CLEAR   Specific Gravity, Urine 1.015 1.005 - 1.030   pH 8.0 5.0 - 8.0   Glucose, UA NEGATIVE NEGATIVE mg/dL   Hgb urine dipstick SMALL (A) NEGATIVE   Bilirubin Urine NEGATIVE NEGATIVE   Ketones, ur 5 (A) NEGATIVE mg/dL   Protein, ur 100 (A) NEGATIVE mg/dL   Nitrite NEGATIVE NEGATIVE   Leukocytes, UA NEGATIVE NEGATIVE   RBC / HPF TOO NUMEROUS TO COUNT 0 - 5 RBC/hpf   WBC, UA TOO NUMEROUS TO COUNT 0 - 5 WBC/hpf   Bacteria, UA MANY (A) NONE  SEEN   Squamous Epithelial / LPF 0-5 (A) NONE SEEN   WBC Clumps PRESENT    Mucus PRESENT   CBC with Differential     Status: Abnormal   Collection Time: 04/29/17  2:28 PM  Result Value Ref Range   WBC 10.3 4.0 - 10.5 K/uL   RBC 4.85 3.87 - 5.11 MIL/uL   Hemoglobin 14.4 12.0 - 15.0 g/dL   HCT 46.4 (H) 36.0 - 46.0 %   MCV 95.7 78.0 - 100.0 fL   MCH 29.7 26.0 - 34.0 pg   MCHC 31.0 30.0 - 36.0 g/dL   RDW 16.8 (H) 11.5 - 15.5 %   Platelets 105 (L) 150 - 400 K/uL    Comment: REPEATED TO VERIFY SPECIMEN CHECKED FOR CLOTS PLATELET COUNT CONFIRMED BY SMEAR    Neutrophils Relative % 78 %   Neutro Abs 8.1 (H) 1.7 - 7.7 K/uL   Lymphocytes Relative 13 %   Lymphs Abs 1.4 0.7 - 4.0 K/uL   Monocytes Relative 8 %   Monocytes Absolute 0.8 0.1 - 1.0 K/uL   Eosinophils Relative 1 %   Eosinophils Absolute 0.1 0.0 - 0.7 K/uL   Basophils Relative 0 %   Basophils Absolute 0.0 0.0 - 0.1 K/uL  Basic metabolic panel     Status: Abnormal   Collection Time: 04/29/17  2:28 PM  Result Value Ref Range   Sodium 155 (H) 135 - 145 mmol/L   Potassium 3.1 (L) 3.5 - 5.1 mmol/L   Chloride 119 (H) 101 - 111 mmol/L   CO2 29 22 - 32 mmol/L   Glucose, Bld 107 (H) 65 - 99 mg/dL   BUN 27 (H) 6 - 20 mg/dL   Creatinine, Ser 1.12 (H) 0.44 - 1.00 mg/dL   Calcium 8.3 (L) 8.9 - 10.3 mg/dL   GFR calc non Af Amer 44 (L) >60 mL/min   GFR calc Af Amer 50 (L) >60 mL/min    Comment: (NOTE) The eGFR has been calculated using the CKD EPI equation. This calculation has not been validated in all clinical situations. eGFR's persistently <60 mL/min signify possible Chronic Kidney Disease.    Anion gap 7 5 - 15   No results found.  Pending Labs Unresulted Labs (From admission, onward)   Start     Ordered   04/29/17 1325  Urine C&S  Once,   STAT     04/29/17 1324      Vitals/Pain Today's Vitals   04/29/17 1212 04/29/17 1312 04/29/17 1444 04/29/17 1643  BP: (!) 159/111 (!) 154/96 (!) 143/96 140/85  Pulse: 81 74 74 71   Resp: 18 (!) 9 10 11  Temp:  98.3 F (36.8 C)    TempSrc:  Rectal    SpO2: 100% 93% 98% 99%    Isolation Precautions No active isolations  Medications Medications  potassium chloride 10 mEq in 100 mL IVPB (0 mEq Intravenous Stopped 04/29/17 1854)  0.9 %  sodium chloride infusion ( Intravenous New Bag/Given 04/29/17 1640)  cefTRIAXone (ROCEPHIN) 1 g in dextrose 5 % 50 mL IVPB (1 g Intravenous New Bag/Given 04/29/17 1858)  sodium chloride 0.9 % bolus 500 mL (0 mLs Intravenous Stopped 04/29/17 1629)  magnesium sulfate IVPB 1 g 100 mL (0 g Intravenous Stopped 04/29/17 1804)    Mobility Ambulatory at home

## 2017-04-29 NOTE — ED Provider Notes (Signed)
  Physical Exam  BP 140/85 (BP Location: Left Arm)   Pulse 71   Temp 98.3 F (36.8 C) (Rectal) Comment: triage RN collect rectal temp  Resp 11   SpO2 99%   Physical Exam  ED Course/Procedures     Procedures  MDM  Received patient in signout.  Appears to have both urinary tract infection and dehydration.  With the patient's mental status changes will admit to the hospitalist.       Davonna Belling, MD 04/29/17 [phone]

## 2017-04-29 NOTE — ED Notes (Signed)
Previous shift nurse stated she would call to give report while floating.

## 2017-04-29 NOTE — ED Notes (Signed)
Pt will need a rectal temperature once placed in room.

## 2017-04-29 NOTE — H&P (Signed)
[facility] H&P   Patient Demographics:    Stacey Fernandez, is a 84 y.o. female  MRN: [id number]   DOB - 21-Aug-1929  Admit Date - 04/29/2017  Outpatient Primary MD for the patient is Gildardo Cranker, DO  Referring MD/NP/PA:  Davonna Belling  Outpatient Specialists:   Patient coming from: home  Chief Complaint  Patient presents with  . Altered Mental Status  . Weakness      HPI:    Stacey Fernandez  is a 84 y.o. female, w dementia, Copd, h/o lung cancer, CKD stage3,  apparently was generally weak and seemed to have AMS consistent w prior UTI.   In ED,  Pt found to have UTI,  ua wbc TNTC  Wbc 10.3, Hgb 14.4, Plt 105 Na 155, Bun 27, creatinine 1.123 K 3.1  Pt will be admitted for UTI, generalized weakness, dehydration and hypokalemia, and hypernatremia.    Review of systems:    In addition to the HPI above, Unable to obtain from patient directly, her family . No Fever-chills, No Headache, No changes with Vision or hearing, No problems swallowing food or Liquids, No Chest pain, Cough or Shortness of Breath, No Abdominal pain, No Nausea or Vommitting, Bowel movements are regular, No Blood in stool or Urine, No dysuria, No new skin rashes or bruises, No new joints pains-aches,   No recent weight gain or loss, No polyuria, polydypsia or polyphagia, No significant Mental Stressors.  A full 10 point Review of Systems was done, except as stated above, all other Review of Systems were negative.   With Past History of the following :    Past Medical History:  Diagnosis Date  . Anxiety state, unspecified   . Arthritis    "hands, feet" (09/13/2015)  . Chronic airway obstruction, not elsewhere classified   . Chronic kidney disease (CKD), stage II (mild)    [name] 09/13/2015  . COPD (chronic obstructive pulmonary disease) ([place])   . Dementia   . Functional constipation     [name] 09/13/2015  . Hemiplegia affecting dominant side, late effect of cerebrovascular disease   . Hyperlipidemia   . Lung cancer ([place])   . Lung mass   . Memory loss   . Mixed Alzheimer's and vascular dementia    /notes 09/13/2015  . Other malaise and fatigue   . Protein calorie malnutrition ([place])    [name] 09/13/2015  . Sacral decubitus ulcer    chronic; stage IV/notes 09/13/2015  . Senile dementia with delirium   . Urinary frequency       Past Surgical History:  Procedure Laterality Date  . CATARACT EXTRACTION, BILATERAL    . mediastinal lymp node dissection  01/10/2011   VAN TRIGT  . WEDGE RESECTION RUL NODULE  01/10/2011   VAN TRIGT      Social History:     Social History   Tobacco Use  . Smoking  status: Former Smoker    Packs/day: 0.50    Years: 20.00    Pack years: 10.00    Types: Cigarettes    Last attempt to quit: 06/09/2010    Years since quitting: 6.8  . Smokeless tobacco: Never Used  Substance Use Topics  . Alcohol use: No    Alcohol/week: 0.0 oz     Lives - at home  Mobility - typically walks with walker or assistance.    Family History :     Family History  Problem Relation Age of Onset  . Heart disease Mother   . Cancer Father   . Heart disease Sister   . Heart disease Brother       Home Medications:   Prior to Admission medications   Medication Sig Start Date End Date Taking? Authorizing Provider  alendronate (FOSAMAX) 70 MG tablet Take 1 tablet (70 mg total) by mouth once a week. Take with a full glass of water on an empty stomach. 11/21/16   Lauree Chandler, NP  aspirin EC 81 MG tablet Take 81 mg by mouth at bedtime.    [provider]  atorvastatin (LIPITOR) 10 MG tablet TAKE 1 TABLET BY MOUTH EVERY DAY 02/05/17   Gildardo Cranker, DO  Calcium Carbonate-Vitamin D (CALTRATE 600+D) 600-400 MG-UNIT tablet Take 1 tablet by mouth 2 (two) times daily.    [provider]  furosemide (LASIX) 20 MG tablet TAKE 0.5 TABLET  (10MG ) BY MOUTH ONCE DAILY 02/05/17   Gildardo Cranker, DO  latanoprost (XALATAN) 0.005 % ophthalmic solution PLACE 1 DROP INTO BOTH EYES AT BEDTIME. 02/15/14   Reed, Tiffany L, DO  memantine (NAMENDA XR) 28 MG CP24 24 hr capsule Take 1 capsule (28 mg total) by mouth daily. 01/08/17   Gildardo Cranker, DO  mirtazapine (REMERON) 15 MG tablet TAKE 1 TABLET (15 MG TOTAL) BY MOUTH AT BEDTIME. 02/21/17   Gildardo Cranker, DO  sodium chloride (OCEAN) 0.65 % SOLN nasal spray Place 1 spray into both nostrils as needed for congestion. 07/28/13   Blanchie Serve, MD  TRUSOPT 2 % ophthalmic solution Place 2 drops into the right eye 2 (two) times daily.  08/20/14   [provider]     Allergies:    No Known Allergies   Physical Exam:   Vitals  Blood pressure 140/85, pulse 71, temperature 98.3 F (36.8 C), temperature source Rectal, resp. rate 11, SpO2 99 %.   1. General ying in bed in NAD,    2. Normal affect and insight, Not Suicidal or Homicidal, Awake Alert, Oriented X 1   3. No F.N deficits, ALL C.Nerves Intact, Strength 5/5 all 4 extremities, Sensation intact all 4 extremities, Plantars down going.  4. Ears and Eyes appear Normal, Conjunctivae clear, PERRLA. DRY oral mucosa  5. Supple Neck, No JVD, No cervical lymphadenopathy appriciated, No Carotid Bruits.  6. Symmetrical Chest wall movement, Good air movement bilaterally, CTAB.  7. RRR, No Gallops, Rubs or Murmurs, No Parasternal Heave.  8. Positive Bowel Sounds, Abdomen Soft, No tenderness, No organomegaly appriciated,No rebound -guarding or rigidity.  9.  No Cyanosis, Normal Skin Turgor, No Skin Rash or Bruise.  10. Good muscle tone,  joints appear normal , no effusions, Normal ROM.  11. No Palpable Lymph Nodes in Neck or Axillae     Data Review:    CBC Recent Labs  Lab 04/29/17 1428  WBC 10.3  HGB 14.4  HCT 46.4*  PLT 105*  MCV 95.7  MCH 29.7  MCHC 31.0  RDW 16.8*  LYMPHSABS 1.4  MONOABS 0.8  EOSABS 0.1    BASOSABS 0.0   ------------------------------------------------------------------------------------------------------------------  Chemistries  Recent Labs  Lab 04/29/17 1428  NA 155*  K 3.1*  CL 119*  CO2 29  GLUCOSE 107*  BUN 27*  CREATININE 1.12*  CALCIUM 8.3*   ------------------------------------------------------------------------------------------------------------------ CrCl cannot be calculated (Unknown ideal weight.). ------------------------------------------------------------------------------------------------------------------ No results for input(s): TSH, T4TOTAL, T3FREE, THYROIDAB in the last 72 hours.  Invalid input(s): FREET3  Coagulation profile No results for input(s): INR, PROTIME in the last 168 hours. ------------------------------------------------------------------------------------------------------------------- No results for input(s): DDIMER in the last 72 hours. -------------------------------------------------------------------------------------------------------------------  Cardiac Enzymes No results for input(s): CKMB, TROPONINI, MYOGLOBIN in the last 168 hours.  Invalid input(s): CK ------------------------------------------------------------------------------------------------------------------ No results found for: BNP   ---------------------------------------------------------------------------------------------------------------  Urinalysis    Component Value Date/Time   COLORURINE RED (A) 04/29/2017 1315   APPEARANCEUR CLOUDY (A) 04/29/2017 1315   LABSPEC 1.015 04/29/2017 1315   PHURINE 8.0 04/29/2017 1315   GLUCOSEU NEGATIVE 04/29/2017 1315   HGBUR SMALL (A) 04/29/2017 1315   BILIRUBINUR NEGATIVE 04/29/2017 1315   KETONESUR 5 (A) 04/29/2017 1315   PROTEINUR 100 (A) 04/29/2017 1315   UROBILINOGEN 1.0 10/31/2010 1241   NITRITE NEGATIVE 04/29/2017 1315   LEUKOCYTESUR NEGATIVE 04/29/2017 1315     ----------------------------------------------------------------------------------------------------------------   Imaging Results:    No results found.    Assessment & Plan:    Principal Problem:   UTI (urinary tract infection) Active Problems:   Hypernatremia   Hypokalemia    UTI  Awaiting urine culture Rocephin 1gm iv qday  Dehydration D5 1/2 ns iv HOLD Lasix  Hypernatremia D5 1/2 ns iv Check cmp in am  Hypokalemia Replete Check cmp in am  Hyperlipidemia Cont lipitor  Dementia Cont namenda  Glaucoma Cont eye drops  Osteoprosis HOLD Fosamax      DVT Prophylaxis Lovenox - SCDs   AM Labs Ordered, also please review Full Orders  Family Communication: Admission, patients condition and plan of care including tests being ordered have been discussed with the patient family who indicate understanding and agree with the plan and Code Status.  Code Status DNR  Likely DC to  home  Condition GUARDED    Consults called: none  Admission status: inpatient   Time spent in minutes : 45   Jani Gravel M.D on 04/29/2017 at 7:19 PM  Between 7am to 7pm - Pager - [phone]. After 7pm go to [email or web address] - password [facility]  [facility] - Office  [phone]

## 2017-04-29 NOTE — Telephone Encounter (Signed)
Noted  

## 2017-04-29 NOTE — ED Triage Notes (Signed)
Pt with worsening weakness and altered mental status per caregivers x several days. Caregiver states poor appetite and decreased intake. Pt with hx of dementia.

## 2017-04-29 NOTE — Telephone Encounter (Signed)
Freda Munro, Caregiver called and stated that patient was real weak and Urine is smelling strong. I offered an appointment for today in office and she stated that patient cannot even get up, refused appointment. Caregiver thinks patient is severely dehydrated. Caregiver is going to take patient to ER. Agreed.

## 2017-04-30 ENCOUNTER — Other Ambulatory Visit: Payer: Self-pay

## 2017-04-30 ENCOUNTER — Inpatient Hospital Stay (HOSPITAL_COMMUNITY): Payer: Medicare Other

## 2017-04-30 DIAGNOSIS — E860 Dehydration: Secondary | ICD-10-CM

## 2017-04-30 DIAGNOSIS — R140 Abdominal distension (gaseous): Secondary | ICD-10-CM

## 2017-04-30 LAB — COMPREHENSIVE METABOLIC PANEL
ALBUMIN: 3 g/dL — AB (ref 3.5–5.0)
ALK PHOS: 72 U/L (ref 38–126)
ALT: 19 U/L (ref 14–54)
ANION GAP: 8 (ref 5–15)
AST: 25 U/L (ref 15–41)
BUN: 25 mg/dL — ABNORMAL HIGH (ref 6–20)
CHLORIDE: 116 mmol/L — AB (ref 101–111)
CO2: 28 mmol/L (ref 22–32)
Calcium: 8.1 mg/dL — ABNORMAL LOW (ref 8.9–10.3)
Creatinine, Ser: 1.02 mg/dL — ABNORMAL HIGH (ref 0.44–1.00)
GFR calc Af Amer: 56 mL/min — ABNORMAL LOW (ref >60)
GFR calc non Af Amer: 49 mL/min — ABNORMAL LOW (ref >60)
Glucose, Bld: 141 mg/dL — ABNORMAL HIGH (ref 65–99)
POTASSIUM: 2.6 mmol/L — AB (ref 3.5–5.1)
SODIUM: 152 mmol/L — AB (ref 135–145)
Total Bilirubin: 0.3 mg/dL (ref 0.3–1.2)
Total Protein: 6.6 g/dL (ref 6.5–8.1)

## 2017-04-30 LAB — CBC
HEMATOCRIT: 42.5 % (ref 36.0–46.0)
HEMOGLOBIN: 13.7 g/dL (ref 12.0–15.0)
MCH: 30.4 pg (ref 26.0–34.0)
MCHC: 32.2 g/dL (ref 30.0–36.0)
MCV: 94.4 fL (ref 78.0–100.0)
Platelets: 106 10*3/uL — ABNORMAL LOW (ref 150–400)
RBC: 4.5 MIL/uL (ref 3.87–5.11)
RDW: 16.8 % — ABNORMAL HIGH (ref 11.5–15.5)
WBC: 9.9 10*3/uL (ref 4.0–10.5)

## 2017-04-30 LAB — URINE CULTURE

## 2017-04-30 LAB — RENAL FUNCTION PANEL
ALBUMIN: 2.7 g/dL — AB (ref 3.5–5.0)
Anion gap: 5 (ref 5–15)
BUN: 22 mg/dL — AB (ref 6–20)
CO2: 23 mmol/L (ref 22–32)
CREATININE: 1.01 mg/dL — AB (ref 0.44–1.00)
Calcium: 7.7 mg/dL — ABNORMAL LOW (ref 8.9–10.3)
Chloride: 121 mmol/L — ABNORMAL HIGH (ref 101–111)
GFR calc Af Amer: 57 mL/min — ABNORMAL LOW (ref >60)
GFR, EST NON AFRICAN AMERICAN: 49 mL/min — AB (ref >60)
Glucose, Bld: 120 mg/dL — ABNORMAL HIGH (ref 65–99)
PHOSPHORUS: 1.9 mg/dL — AB (ref 2.5–4.6)
POTASSIUM: 3.6 mmol/L (ref 3.5–5.1)
Sodium: 149 mmol/L — ABNORMAL HIGH (ref 135–145)

## 2017-04-30 LAB — MAGNESIUM: MAGNESIUM: 2 mg/dL (ref 1.7–2.4)

## 2017-04-30 MED ORDER — POTASSIUM CL IN DEXTROSE 5% 20 MEQ/L IV SOLN
20.0000 meq | INTRAVENOUS | Status: DC
  Administered 2017-04-30: 20 meq via INTRAVENOUS
  Filled 2017-04-30 (×2): qty 1000

## 2017-04-30 MED ORDER — POTASSIUM CHLORIDE 10 MEQ/100ML IV SOLN
10.0000 meq | INTRAVENOUS | Status: AC
  Administered 2017-04-30 (×6): 10 meq via INTRAVENOUS
  Filled 2017-04-30 (×6): qty 100

## 2017-04-30 MED ORDER — POTASSIUM PHOSPHATES 15 MMOLE/5ML IV SOLN
20.0000 mmol | Freq: Once | INTRAVENOUS | Status: AC
  Administered 2017-04-30: 20 mmol via INTRAVENOUS
  Filled 2017-04-30: qty 6.67

## 2017-04-30 MED ORDER — BISACODYL 10 MG RE SUPP
10.0000 mg | Freq: Once | RECTAL | Status: AC
  Administered 2017-04-30: 10 mg via RECTAL
  Filled 2017-04-30: qty 1

## 2017-04-30 MED ORDER — SODIUM CHLORIDE 0.9 % IV BOLUS (SEPSIS)
1000.0000 mL | Freq: Once | INTRAVENOUS | Status: AC
  Administered 2017-04-30: 1000 mL via INTRAVENOUS

## 2017-04-30 MED ORDER — POTASSIUM CHLORIDE CRYS ER 20 MEQ PO TBCR
40.0000 meq | EXTENDED_RELEASE_TABLET | ORAL | Status: DC
  Filled 2017-04-30: qty 2

## 2017-04-30 NOTE — Consult Note (Signed)
[facility] Nurse wound consult note Reason for Consult:  Recurrent full thickness pressure injury (POA) Wound type: Pressure Pressure Injury POA: Yes Measurement: 5cm x 3cm pink, smooth scar where a full thickness pressure injury has healed at the coccyx; at the most distal end of this is a 1cm round x 0.2cm Stage 3 pressure injury at the coccygeal tissue. This area was noted by my partner, D. Barbie Haggis nearly a year ago as a Stage 2 pressure injury.  There is no indication that this area ever healed. Wound bed: red, moist Drainage (amount, consistency, odor) small amount serous Periwound: As noted above: superiorly there is 4cm of scar tissue. Dressing procedure/placement/frequency: Patient is noted to be impacted and has just received a suppository. Small amount of light brown liquid stool is leaking from the rectum and is on the perianal skin. Nursing is provided with guidance for turning and repositioning (limiting any supine positioning) and for application of moisture barrier cream following cleansing after any incontinence. Patient is on a pressure redistribution sleep surface and has bilateral pressure redistribution heel boots in place. Bedside RN in room and we discuss POC.  [facility] nursing team will not follow, but will remain available to this patient, the nursing and medical teams.  Please re-consult if needed. Thanks, Maudie Flakes, MSN, RN, [facility], Arther Abbott  Pager# [phone]

## 2017-04-30 NOTE — Progress Notes (Signed)
Soap suds enema given.  Pt had a large stool after.  Pt has had a total of 5 moderate to large stools throughout the day.

## 2017-04-30 NOTE — Progress Notes (Signed)
PROGRESS NOTE  Stacey Fernandez  ZOX:[id number] DOB: 1929/05/11 DOA: 04/29/2017 PCP: Gildardo Cranker, DO  Brief Narrative:   The patient is an 84 year old female with dementia, COPD, history of lung cancer, chronic kidney disease stage III who presented with altered mental status and weakness.  She was found to be profoundly dehydrated with hypernatremia, constipation, and urinary tract infection.  Assessment & Plan:  UTI, urine culture pending Rocephin 1gm iv qday  Dehydration with hypernatremia, still appears dry -Normal saline bolus -Change IV fluids to D5W -Repeat BMP this afternoon  Hypokalemia, unable to tolerate oral potassium repletion -IV potassium chloride -Check magnesium level:  2  Metabolic encephalopathy secondary to dehydration and hypernatremia and urinary tract infection -Unable to safely tolerate p.o. -N.p.o. -Speech therapy assessment tomorrow  Fullness on abdominal exam and nurse reports that she has been using some liquid stool, concern for impaction - KUB: Suggestive of impaction -Suppository and if that does not work enema and if that does not work manual disimpaction  Hyperlipidemia Hold Lipitor  Dementia Hold Namenda  Glaucoma Cont eye drops  Osteoprosis HOLD Fosamax  DVT prophylaxis: Lovenox Code Status: DNR Family Communication: No family at bedside Disposition Plan: Likely discharge to home with assistance from home health aides in a few days   Consultants:   None  Procedures:  None  Antimicrobials:  Anti-infectives (From admission, onward)   Start     Dose/Rate Route Frequency Ordered Stop   04/30/17 1900  cefTRIAXone (ROCEPHIN) 1 g in dextrose 5 % 50 mL IVPB     1 g 100 mL/hr over 30 Minutes Intravenous Every 24 hours 04/29/17 2056     04/29/17 1815  cefTRIAXone (ROCEPHIN) 1 g in dextrose 5 % 50 mL IVPB     1 g 100 mL/hr over 30 Minutes Intravenous  Once 04/29/17 1805 04/29/17 1942        Subjective:  Patient unable to communicate, dementia, altered mentation  Objective: Vitals:   04/29/17 1954 04/29/17 2056 04/30/17 0444 04/30/17 1304  BP: 135/87 (!) 138/100 (!) 143/93 133/65  Pulse: 71 74 79 86  Resp: 10 16 18    Temp:  98.1 F (36.7 C) 98.2 F (36.8 C) 98.4 F (36.9 C)  TempSrc:  Axillary Temporal Oral  SpO2: 98% 100% 100% 98%  Weight:  39.6 kg (87 lb 6.4 oz) 39.6 kg (87 lb 6.4 oz)   Height:  5\' 2"  (1.575 m)      Intake/Output Summary (Last 24 hours) at 04/30/2017 1321 Last data filed at 04/30/2017 0600 Gross per 24 hour  Intake 750 ml  Output -  Net 750 ml   Filed Weights   04/29/17 2056 04/30/17 0444  Weight: 39.6 kg (87 lb 6.4 oz) 39.6 kg (87 lb 6.4 oz)    Examination:  General exam:  Adult female, thin, mild temporal wasting.  No acute distress.  HEENT:  NCAT, dry mucous membranes, sunken eyes Respiratory system: Clear to auscultation bilaterally Cardiovascular system: Regular rate and rhythm, normal S1/S2. No murmurs, rubs, gallops or clicks.  Warm extremities Gastrointestinal system: Normal active bowel sounds, soft, mildly distended and full feeling, nontender MSK:  Normal tone and bulk, 1+ pitting bilateral lower extremity edema Neuro:  Grossly moves all extremities    Data Reviewed: I have personally reviewed following labs and imaging studies  CBC: Recent Labs  Lab 04/29/17 1428 04/30/17 0551  WBC 10.3 9.9  NEUTROABS 8.1*  --   HGB 14.4 13.7  HCT 46.4* 42.5  MCV 95.7  94.4  PLT 105* 992*   Basic Metabolic Panel: Recent Labs  Lab 04/29/17 1428 04/30/17 0551 04/30/17 0700  NA 155* 152*  --   K 3.1* 2.6*  --   CL 119* 116*  --   CO2 29 28  --   GLUCOSE 107* 141*  --   BUN 27* 25*  --   CREATININE 1.12* 1.02*  --   CALCIUM 8.3* 8.1*  --   MG  --   --  2.0   GFR: Estimated Creatinine Clearance: 25.2 mL/min (A) (by C-G formula based on SCr of 1.02 mg/dL (H)). Liver Function Tests: Recent Labs  Lab 04/30/17 0551   AST 25  ALT 19  ALKPHOS 72  BILITOT 0.3  PROT 6.6  ALBUMIN 3.0*   No results for input(s): LIPASE, AMYLASE in the last 168 hours. No results for input(s): AMMONIA in the last 168 hours. Coagulation Profile: No results for input(s): INR, PROTIME in the last 168 hours. Cardiac Enzymes: No results for input(s): CKTOTAL, CKMB, CKMBINDEX, TROPONINI in the last 168 hours. BNP (last 3 results) No results for input(s): PROBNP in the last 8760 hours. HbA1C: No results for input(s): HGBA1C in the last 72 hours. CBG: No results for input(s): GLUCAP in the last 168 hours. Lipid Profile: No results for input(s): CHOL, HDL, LDLCALC, TRIG, CHOLHDL, LDLDIRECT in the last 72 hours. Thyroid Function Tests: No results for input(s): TSH, T4TOTAL, FREET4, T3FREE, THYROIDAB in the last 72 hours. Anemia Panel: No results for input(s): VITAMINB12, FOLATE, FERRITIN, TIBC, IRON, RETICCTPCT in the last 72 hours. Urine analysis:    Component Value Date/Time   COLORURINE RED (A) 04/29/2017 1315   APPEARANCEUR CLOUDY (A) 04/29/2017 1315   LABSPEC 1.015 04/29/2017 1315   PHURINE 8.0 04/29/2017 1315   GLUCOSEU NEGATIVE 04/29/2017 1315   HGBUR SMALL (A) 04/29/2017 1315   BILIRUBINUR NEGATIVE 04/29/2017 1315   KETONESUR 5 (A) 04/29/2017 1315   PROTEINUR 100 (A) 04/29/2017 1315   UROBILINOGEN 1.0 10/31/2010 1241   NITRITE NEGATIVE 04/29/2017 1315   LEUKOCYTESUR NEGATIVE 04/29/2017 1315   Sepsis Labs: @LABRCNTIP (procalcitonin:4,lacticidven:4)  )No results found for this or any previous visit (from the past 240 hour(s)).    Radiology Studies: Dg Abd Portable 1v  Result Date: 04/30/2017 CLINICAL DATA:  Nausea and vomiting, chronic renal insufficiency stage II. EXAM: PORTABLE ABDOMEN - 1 VIEW COMPARISON:  Abdominal radiograph of Sep 13, 2015 FINDINGS: The stool burden within the rectum is increased. There is a small amount of stool within the descending colon and scattered amounts of stool in the  ascending and transverse colon. There is no small bowel obstructive pattern. There is no free extraluminal gas. The bony structures are unremarkable. IMPRESSION: Findings compatible with a fecal impaction in the appropriate clinical setting. No other acute intra-abdominal abnormality is observed. Electronically Signed   By: David  Martinique M.D.   On: 04/30/2017 10:47     Scheduled Meds: . dorzolamide  2 drop Right Eye BID  . enoxaparin (LOVENOX) injection  30 mg Subcutaneous Q24H  . latanoprost  1 drop Both Eyes QHS   Continuous Infusions: . cefTRIAXone (ROCEPHIN)  IV    . dextrose 5 % with KCl 20 mEq / L 20 mEq (04/30/17 1204)  . potassium chloride Stopped (04/30/17 1258)     LOS: 1 day    Time spent: 30 min    Janece Canterbury, MD [facility] Pager [phone]  If 7PM-7AM, please contact night-coverage [email or web address] Password [email or web address] 04/30/2017, 1:21 PM

## 2017-04-30 NOTE — Progress Notes (Signed)
CRITICAL VALUE ALERT  Critical Value:  K 2.6  Date & Time Notied: 04/30/17   0705  Provider Notified: Short   Orders Received/Actions taken:

## 2017-05-01 ENCOUNTER — Inpatient Hospital Stay (HOSPITAL_COMMUNITY): Payer: Medicare Other

## 2017-05-01 ENCOUNTER — Encounter (HOSPITAL_COMMUNITY): Payer: Self-pay | Admitting: Radiology

## 2017-05-01 LAB — BASIC METABOLIC PANEL
ANION GAP: 4 — AB (ref 5–15)
Anion gap: 5 (ref 5–15)
BUN: 20 mg/dL (ref 6–20)
BUN: 19 mg/dL (ref 6–20)
CALCIUM: 7.5 mg/dL — AB (ref 8.9–10.3)
CALCIUM: 7.3 mg/dL — AB (ref 8.9–10.3)
CHLORIDE: 113 mmol/L — AB (ref 101–111)
CO2: 25 mmol/L (ref 22–32)
CO2: 23 mmol/L (ref 22–32)
CREATININE: 0.92 mg/dL (ref 0.44–1.00)
Chloride: 116 mmol/L — ABNORMAL HIGH (ref 101–111)
Creatinine, Ser: 0.9 mg/dL (ref 0.44–1.00)
GFR calc Af Amer: >60 mL/min (ref >60)
GFR calc non Af Amer: 55 mL/min — ABNORMAL LOW (ref >60)
GFR, EST NON AFRICAN AMERICAN: 57 mL/min — AB (ref >60)
Glucose, Bld: 108 mg/dL — ABNORMAL HIGH (ref 65–99)
Glucose, Bld: 92 mg/dL (ref 65–99)
POTASSIUM: 3.4 mmol/L — AB (ref 3.5–5.1)
Potassium: 3.4 mmol/L — ABNORMAL LOW (ref 3.5–5.1)
SODIUM: 145 mmol/L (ref 135–145)
SODIUM: 141 mmol/L (ref 135–145)

## 2017-05-01 LAB — BLOOD GAS, VENOUS
ACID-BASE DEFICIT: 0.5 mmol/L (ref 0.0–2.0)
BICARBONATE: 23.8 mmol/L (ref 20.0–28.0)
FIO2: 21
O2 SAT: 52.6 %
PCO2 VEN: 40.5 mmHg — AB (ref 44.0–60.0)
Patient temperature: 37
pH, Ven: 7.388 (ref 7.250–7.430)

## 2017-05-01 LAB — ALBUMIN: ALBUMIN: 2.5 g/dL — AB (ref 3.5–5.0)

## 2017-05-01 MED ORDER — POTASSIUM CL IN DEXTROSE 5% 20 MEQ/L IV SOLN
20.0000 meq | INTRAVENOUS | Status: DC
  Administered 2017-05-01: 20 meq via INTRAVENOUS
  Filled 2017-05-01 (×2): qty 1000

## 2017-05-01 MED ORDER — ORAL CARE MOUTH RINSE
15.0000 mL | Freq: Two times a day (BID) | OROMUCOSAL | Status: DC
  Administered 2017-05-01 – 2017-05-04 (×6): 15 mL via OROMUCOSAL

## 2017-05-01 MED ORDER — POTASSIUM CL IN DEXTROSE 5% 20 MEQ/L IV SOLN
20.0000 meq | INTRAVENOUS | Status: DC
  Filled 2017-05-01: qty 1000

## 2017-05-01 NOTE — Progress Notes (Signed)
Initial Nutrition Assessment  DOCUMENTATION CODES:   Severe malnutrition in context of chronic illness, Underweight  INTERVENTION:  - Diet advancement as medically feasible. - RD will continue to monitor for nutrition-related plan, POC/GOC.   NUTRITION DIAGNOSIS:   Severe Malnutrition related to chronic illness(dementia) as evidenced by severe fat depletion, severe muscle depletion.  GOAL:   Patient will meet greater than or equal to 90% of their needs  MONITOR:   Diet advancement, Weight trends, Labs, Skin  REASON FOR ASSESSMENT:   Low Braden  ASSESSMENT:   84 year old female with dementia, COPD, history of lung cancer, chronic kidney disease stage III who presented with altered mental status and weakness.  She was found to be profoundly dehydrated with hypernatremia, constipation, and urinary tract infection.  BMI indicates underweight status. Pt has been NPO since admission and SLP note from earlier today states that pt was not alert enough to safely participate in bedside swallow evaluation. Pt is disoriented x4, mainly unresponsive. Caregiver is at bedside at this time. Did not elicit information at this time until more is known about clinical course and plan moving forward.   Physical assessment outlined below. Per chart review, weight has been mainly stable since 12/13/15 (88-93 lbs).   Medications reviewed; 10 mEq IV KCl x6 runs yesterday, 20 mmol IV KPhos x1 run yesterday. Labs reviewed; K: 3.4 mmol/L, Cl: 116 mmol/L, Ca: 7.5 mg/dL.  IVF: D5-20 mEq KCl @ 100 mL/hr (408 kcal).    NUTRITION - FOCUSED PHYSICAL EXAM:    Most Recent Value  Orbital Region  Mild depletion  Upper Arm Region  Severe depletion  Thoracic and Lumbar Region  Unable to assess  Buccal Region  Moderate depletion  Temple Region  Moderate depletion  Clavicle Bone Region  Severe depletion  Clavicle and Acromion Bone Region  Severe depletion  Scapular Bone Region  Unable to assess  Dorsal Hand   Moderate depletion  Patellar Region  Unable to assess  Anterior Thigh Region  Unable to assess  Posterior Calf Region  Unable to assess  Edema (RD Assessment)  Unable to assess  Hair  Reviewed  Eyes  Unable to assess  Mouth  Unable to assess  Skin  Reviewed  Nails  Reviewed       Diet Order:  Diet NPO time specified  EDUCATION NEEDS:   No education needs have been identified at this time  Skin:  Skin Assessment: Skin Integrity Issues: Skin Integrity Issues:: Other (Comment) Other: Non-pressure wound to R buttocks  Last BM:  1/16  Height:   Ht Readings from Last 1 Encounters:  04/29/17 5\' 2"  (1.575 m)    Weight:   Wt Readings from Last 1 Encounters:  05/01/17 93 lb 0.6 oz (42.2 kg)    Ideal Body Weight:  50 kg  BMI:  Body mass index is 17.02 kg/m.  Estimated Nutritional Needs:   Kcal:  1265-1475 (30-35 kcal/kg)  Protein:  42-51 grams (1-1.2 grams/kg)  Fluid:  >/= 1.5 L/day     Jarome Matin, MS, RD, LDN, [facility] Inpatient Clinical Dietitian Pager # [phone] After hours/weekend pager # [phone]

## 2017-05-01 NOTE — Evaluation (Signed)
Clinical/Bedside Swallow Evaluation Patient Details  Name: Stacey Fernandez MRN: [id number] Date of Birth: [birth year]  Today's Date: 05/01/2017 Time: SLP Start Time (ACUTE ONLY): 1000 SLP Stop Time (ACUTE ONLY): 1023 SLP Time Calculation (min) (ACUTE ONLY): 23 min  Past Medical History:  Past Medical History:  Diagnosis Date  . Anxiety state, unspecified   . Arthritis    "hands, feet" (09/13/2015)  . Chronic airway obstruction, not elsewhere classified   . Chronic kidney disease (CKD), stage II (mild)    Stacey Fernandez 09/13/2015  . COPD (chronic obstructive pulmonary disease) ([place])   . Dementia   . Functional constipation    Stacey Fernandez 09/13/2015  . Hemiplegia affecting dominant side, late effect of cerebrovascular disease   . Hyperlipidemia   . Lung cancer ([place])   . Lung mass   . Memory loss   . Mixed Alzheimer's and vascular dementia    /notes 09/13/2015  . Other malaise and fatigue   . Protein calorie malnutrition ([place])    Stacey Fernandez 09/13/2015  . Sacral decubitus ulcer    chronic; stage IV/notes 09/13/2015  . Senile dementia with delirium   . Urinary frequency    Past Surgical History:  Past Surgical History:  Procedure Laterality Date  . CATARACT EXTRACTION, BILATERAL    . mediastinal lymp node dissection  01/10/2011   VAN TRIGT  . WEDGE RESECTION RUL NODULE  01/10/2011   VAN TRIGT   HPI:  84 yo female with h/o UTI, AMS, lethargy - PMH + for lung mass, GERD, dementia, FTT, prior palliative encounter.  Swallow evaluation ordered for today.  No family present.     Assessment / Plan / Recommendation Clinical Impression  Pt's largest barrier to po intake at this time is lethargy.  Anticipate when she awakens adequately, her swallow ability will be functional.  Pt with good dentition indicative of adequate oral care prior to admission.   No focal CN deficits overtly apparent and pt able to seal lips tightly on spoon and oral suction indicative of intact Facial nerve, however pt did not  follow directions.    Pt orally held boluses provided by SlP despite max tactile/verbal cueing- suspect lethargy and possible mild oral deficits from prior CVA and dementia.    She did elicit swallow with liquids via spoon but SLP removed applesauce retained in oral cavity.    Recommend NPO for now - it pt awakens adequately would recommend liquids at this time.  Orally suction if pt does not elicit swallow.    Note plan is to contact family to determine goals per MD.  Will follow up for family education, pt po tolerance, etc. Thanks for this consult.  SLP Visit Diagnosis: Dysphagia, oral phase (R13.11)    Aspiration Risk  Risk for inadequate nutrition/hydration;Severe aspiration risk    Diet Recommendation NPO(if awakens adequately, liquids, medicine with puree)   Liquid Administration via: Spoon    Other  Recommendations Oral Care Recommendations: Oral care QID Other Recommendations: Have oral suction available(slp set up oral suction)   Follow up Recommendations        Frequency and Duration min 1 x/week  1 week       Prognosis Prognosis for Safe Diet Advancement: Fair Barriers to Reach Goals: Cognitive deficits;Other (Comment)(comorbidities, dementia)      Swallow Study   General Date of Onset: 05/01/17 HPI: 84 yo female with h/o UTI, AMS, lethargy - PMH + for lung mass, GERD, dementia, FTT, prior palliative encounter.  Swallow evaluation ordered for today.  No family present.   Type of Study: Bedside Swallow Evaluation Temperature Spikes Noted: No Respiratory Status: Room air History of Recent Intubation: No Behavior/Cognition: Lethargic/Drowsy Oral Cavity Assessment: Other (comment)(pt sealed lips tightly to prevent adequate view, appears xerostomic) Oral Care Completed by SLP: Yes Oral Cavity - Dentition: Other (Comment)(dentition present) Self-Feeding Abilities: Total assist Patient Positioning: Upright in bed Baseline Vocal Quality: Not observed Volitional  Cough: Cognitively unable to elicit Volitional Swallow: Unable to elicit    Oral/Motor/Sensory Function Overall Oral Motor/Sensory Function: Generalized oral weakness(pt able to seal lips tightly on spoon)   Ice Chips Ice chips: Not tested   Thin Liquid Thin Liquid: Impaired Presentation: Spoon Oral Phase Functional Implications: Prolonged oral transit Pharyngeal  Phase Impairments: Suspected delayed Swallow    Nectar Thick Nectar Thick Liquid: Not tested   Honey Thick Honey Thick Liquid: Not tested   Puree Puree: Impaired Presentation: Self Fed;Spoon Oral Phase Functional Implications: Oral holding;Prolonged oral transit Pharyngeal Phase Impairments: Suspected delayed Swallow Other Comments: SlP orally suctioned to remove bolus after pt held for several minutes   Solid   GO   Solid: Not tested       Stacey Fernandez, [facility] SLP [phone]  Stacey Fernandez 05/01/2017,10:34 AM

## 2017-05-01 NOTE — Progress Notes (Addendum)
PROGRESS NOTE    Stacey Fernandez  QMV:[id number] DOB: October 09, 1929 DOA: 04/29/2017 PCP: Stacey Boys, DO   Brief Narrative:  The patient is an 84 year old female with dementia, COPD, history of lung cancer, chronic kidney disease stage III who presented with altered mental status and weakness.  She was found to be profoundly dehydrated with hypernatremia, constipation, and urinary tract infection.  Assessment & Plan:   Principal Problem:   UTI (urinary tract infection) Active Problems:   Hypernatremia   Acute cystitis   Hypokalemia  Metabolic encephalopathy thought secondary to dehydration and hypernatremia and urinary tract infection -Unable to safely tolerate p.o. -N.p.o. -Speech therapy following, appreciate recs -Will get head CT and EEG -VBG with normal CO2  Concern for UTI, urine culture with multiple species Rocephin 1gm iv qday,   Dehydration with hypernatremia -Improved with D5W -Will decrease rate and repeat afternoon BMP  Hypokalemia -Continue K in IVF - normal mag yesterday  Fullness on abdominal exam and nurse reports that she has been using some liquid stool, concern for fecal impaction - KUB: Suggestive of impaction - s/p suppository  Thrombocytopenia: chronic, ctm  Hyperlipidemia Hold Lipitor  Dementia Hold Namenda  Glaucoma Cont eye drops  Osteoprosis HOLD Fosamax  Pressure injury: wound c/s in place.  Scar with healed pressure injury and small stage III pressure injury.   DVT prophylaxis: lovenox Code Status: DNR Family Communication: discussed with Stacey Fernandez, brother and guardian/HCPOA - discussed that Stacey Fernandez appears very ill with her significant AMS.  He notes she's been progressively declining over past month.   Disposition Plan: pending improvement  Consultants:   none  Procedures:   none  Antimicrobials: Anti-infectives (From admission, onward)   Start     Dose/Rate Route Frequency Ordered Stop   04/30/17 1900   cefTRIAXone (ROCEPHIN) 1 g in dextrose 5 % 50 mL IVPB     1 g 100 mL/hr over 30 Minutes Intravenous Every 24 hours 04/29/17 2056     04/29/17 1815  cefTRIAXone (ROCEPHIN) 1 g in dextrose 5 % 50 mL IVPB     1 g 100 mL/hr over 30 Minutes Intravenous  Once 04/29/17 1805 04/29/17 1942      Subjective: Unable to communicate.   Objective: Vitals:   04/30/17 0444 04/30/17 1304 04/30/17 2112 05/01/17 0412  BP: (!) 143/93 133/65 134/87 127/62  Pulse: 79 86 84 76  Resp: 18  18 18   Temp: 98.2 F (36.8 C) 98.4 F (36.9 C) (!) 97.5 F (36.4 C) (!) 97 F (36.1 C)  TempSrc: Temporal Oral Axillary Axillary  SpO2: 100% 98% 100% 95%  Weight: 39.6 kg (87 lb 6.4 oz)   42.2 kg (93 lb 0.6 oz)  Height:        Intake/Output Summary (Last 24 hours) at 05/01/2017 1200 Last data filed at 04/30/2017 1841 Gross per 24 hour  Intake 1518.34 ml  Output -  Net 1518.34 ml   Filed Weights   04/29/17 2056 04/30/17 0444 05/01/17 0412  Weight: 39.6 kg (87 lb 6.4 oz) 39.6 kg (87 lb 6.4 oz) 42.2 kg (93 lb 0.6 oz)    Examination:  General exam: Appears calm and comfortable  Respiratory system: Clear to auscultation. Respiratory effort normal. Cardiovascular system: S1 & S2 heard, RRR. No JVD, murmurs, rubs, gallops or clicks. No pedal edema. Gastrointestinal system: Abdomen is nondistended, soft and nontender. No organomegaly or masses felt.  Central nervous system: Grimaces to painful stimuli.  Unable to follow commands.  Symmetric pupils.  Negative  babinski.     Extremities: no lee Skin: decub not examined today Psychiatry: Judgement and insight appear normal. Mood & affect appropriate.     Data Reviewed: I have personally reviewed following labs and imaging studies  CBC: Recent Labs  Lab 04/29/17 1428 04/30/17 0551  WBC 10.3 9.9  NEUTROABS 8.1*  --   HGB 14.4 13.7  HCT 46.4* 42.5  MCV 95.7 94.4  PLT 105* 106*   Basic Metabolic Panel: Recent Labs  Lab 04/29/17 1428 04/30/17 0551  04/30/17 0700 04/30/17 1525 05/01/17 0530  NA 155* 152*  --  149* 145  K 3.1* 2.6*  --  3.6 3.4*  CL 119* 116*  --  121* 116*  CO2 29 28  --  23 25  GLUCOSE 107* 141*  --  120* 108*  BUN 27* 25*  --  22* 20  CREATININE 1.12* 1.02*  --  1.01* 0.90  CALCIUM 8.3* 8.1*  --  7.7* 7.5*  MG  --   --  2.0  --   --   PHOS  --   --   --  1.9*  --    GFR: Estimated Creatinine Clearance: 30.4 mL/min (by C-G formula based on SCr of 0.9 mg/dL). Liver Function Tests: Recent Labs  Lab 04/30/17 0551 04/30/17 1525  AST 25  --   ALT 19  --   ALKPHOS 72  --   BILITOT 0.3  --   PROT 6.6  --   ALBUMIN 3.0* 2.7*   No results for input(s): LIPASE, AMYLASE in the last 168 hours. No results for input(s): AMMONIA in the last 168 hours. Coagulation Profile: No results for input(s): INR, PROTIME in the last 168 hours. Cardiac Enzymes: No results for input(s): CKTOTAL, CKMB, CKMBINDEX, TROPONINI in the last 168 hours. BNP (last 3 results) No results for input(s): PROBNP in the last 8760 hours. HbA1C: No results for input(s): HGBA1C in the last 72 hours. CBG: No results for input(s): GLUCAP in the last 168 hours. Lipid Profile: No results for input(s): CHOL, HDL, LDLCALC, TRIG, CHOLHDL, LDLDIRECT in the last 72 hours. Thyroid Function Tests: No results for input(s): TSH, T4TOTAL, FREET4, T3FREE, THYROIDAB in the last 72 hours. Anemia Panel: No results for input(s): VITAMINB12, FOLATE, FERRITIN, TIBC, IRON, RETICCTPCT in the last 72 hours. Sepsis Labs: No results for input(s): PROCALCITON, LATICACIDVEN in the last 168 hours.  Recent Results (from the past 240 hour(s))  Urine C&S     Status: Abnormal   Collection Time: 04/29/17  1:15 PM  Result Value Ref Range Status   Specimen Description URINE, CATHETERIZED  Final   Special Requests NONE  Final   Culture MULTIPLE SPECIES PRESENT, SUGGEST RECOLLECTION (Stacey Fernandez)  Final   Report Status 04/30/2017 FINAL  Final         Radiology Studies: Dg  Abd Portable 1v  Result Date: 04/30/2017 CLINICAL DATA:  Nausea and vomiting, chronic renal insufficiency stage II. EXAM: PORTABLE ABDOMEN - 1 VIEW COMPARISON:  Abdominal radiograph of Sep 13, 2015 FINDINGS: The stool burden within the rectum is increased. There is Stacey Fernandez small amount of stool within the descending colon and scattered amounts of stool in the ascending and transverse colon. There is no small bowel obstructive pattern. There is no free extraluminal gas. The bony structures are unremarkable. IMPRESSION: Findings compatible with Glada Wickstrom fecal impaction in the appropriate clinical setting. No other acute intra-abdominal abnormality is observed. Electronically Signed   By: David  [place] M.D.   On: 04/30/2017 10:47  Scheduled Meds: . dorzolamide  2 drop Right Eye BID  . enoxaparin (LOVENOX) injection  30 mg Subcutaneous Q24H  . latanoprost  1 drop Both Eyes QHS   Continuous Infusions: . cefTRIAXone (ROCEPHIN)  IV Stopped (04/30/17 1828)  . dextrose 5 % with KCl 20 mEq / L 20 mEq (04/30/17 1601)     LOS: 2 days    Time spent: over 30 min    Lacretia Nicks, MD [facility] Pager [phone]  If 7PM-7AM, please contact night-coverage [email or web address] Password [email or web address] 05/01/2017, 12:00 PM

## 2017-05-02 ENCOUNTER — Inpatient Hospital Stay (HOSPITAL_COMMUNITY)
Admit: 2017-05-02 | Discharge: 2017-05-02 | Disposition: A | Discharge status: Home / Self Care | Payer: Medicare Other | Attending: Family Medicine | Admitting: Family Medicine

## 2017-05-02 LAB — BASIC METABOLIC PANEL
Anion gap: 6 (ref 5–15)
BUN: 17 mg/dL (ref 6–20)
CHLORIDE: 114 mmol/L — AB (ref 101–111)
CO2: 21 mmol/L — AB (ref 22–32)
CREATININE: 0.88 mg/dL (ref 0.44–1.00)
Calcium: 7.4 mg/dL — ABNORMAL LOW (ref 8.9–10.3)
GFR calc non Af Amer: 58 mL/min — ABNORMAL LOW (ref >60)
GLUCOSE: 82 mg/dL (ref 65–99)
Potassium: 3.3 mmol/L — ABNORMAL LOW (ref 3.5–5.1)
Sodium: 141 mmol/L (ref 135–145)

## 2017-05-02 LAB — CBC
HEMATOCRIT: 36.5 % (ref 36.0–46.0)
Hemoglobin: 11.5 g/dL — ABNORMAL LOW (ref 12.0–15.0)
MCH: 29.9 pg (ref 26.0–34.0)
MCHC: 31.5 g/dL (ref 30.0–36.0)
MCV: 95.1 fL (ref 78.0–100.0)
Platelets: 97 10*3/uL — ABNORMAL LOW (ref 150–400)
RBC: 3.84 MIL/uL — ABNORMAL LOW (ref 3.87–5.11)
RDW: 17 % — AB (ref 11.5–15.5)
WBC: 6.7 10*3/uL (ref 4.0–10.5)

## 2017-05-02 LAB — MAGNESIUM: Magnesium: 1.8 mg/dL (ref 1.7–2.4)

## 2017-05-02 MED ORDER — POTASSIUM CHLORIDE 10 MEQ/100ML IV SOLN
10.0000 meq | INTRAVENOUS | Status: AC
  Administered 2017-05-02 (×2): 10 meq via INTRAVENOUS
  Filled 2017-05-02 (×2): qty 100

## 2017-05-02 NOTE — Progress Notes (Signed)
Offsite EEG completed, results pending.

## 2017-05-02 NOTE — Procedures (Signed)
EEG Report  Clinical History:  Altered mental status in the setting of dementia, UTI, and dehydration with hypernatremia.    Technical Summary:  A 19 channel digital EEG recording was performed using the 10-20 international system of electrode placement.  Bipolar and Referential montages were used.  The total recording time was approx 20 minutes.  Findings:  There is no posterior dominant alpha rhythm.  Posterior frequencies are about 7 Hz and symmetrical.   No focal slowing is present.  Sleep is not recorded.  There are no epileptiform discharges or electrographic seizures present.    Impression:  This is an abnormal EEG.  There is evidence of mild generalized slowing of brain activity which may be due to underlying dementia and/or metabolic encephalopathy.  The patient is not in non-convulsive status epilepticus.    Rogue Jury, MS, MD

## 2017-05-02 NOTE — Care Management Important Message (Signed)
Important Message  Patient Details  Name: MIRANDA FRESE MRN: [id number] Date of Birth: 02-17-1930   Medicare Important Message Given:  Yes    Kerin Salen 05/02/2017, [place] Message  Patient Details  Name: JANASHIA PARCO MRN: [id number] Date of Birth: 04-03-1930   Medicare Important Message Given:  Yes    Kerin Salen 05/02/2017, 11:22 AM

## 2017-05-02 NOTE — Progress Notes (Signed)
SLP Cancellation Note  Patient Details Name: Stacey Fernandez MRN: [id number] DOB: [birth year]   Cancelled treatment:       Reason Eval/Treat Not Completed: Other (comment)(pt currently sound asleep, nurse technician reports pt did not awaken adequately even when rolled in bed)   Macario Golds 05/02/2017, 11:39 AM   [name], [place] [facility] SLP [phone]

## 2017-05-02 NOTE — Plan of Care (Signed)
  Progressing Health Behavior/Discharge Planning: Ability to manage health-related needs will improve 05/02/2017 1033 - Progressing by Kerrin Mo, RN Clinical Measurements: Ability to maintain clinical measurements within normal limits will improve 05/02/2017 1033 - Progressing by Kerrin Mo, RN Will remain free from infection 05/02/2017 1033 - Progressing by Kerrin Mo, RN Note On IV Rocephin Diagnostic test results will improve 05/02/2017 1033 - Progressing by Kerrin Mo, RN Respiratory complications will improve 05/02/2017 1033 - Progressing by Kerrin Mo, RN Cardiovascular complication will be avoided 05/02/2017 1033 - Progressing by Kerrin Mo, RN Activity: Risk for activity intolerance will decrease 05/02/2017 1033 - Progressing by Kerrin Mo, RN Coping: Level of anxiety will decrease 05/02/2017 1033 - Progressing by Kerrin Mo, RN Elimination: Will not experience complications related to bowel motility 05/02/2017 1033 - Progressing by Kerrin Mo, RN Will not experience complications related to urinary retention 05/02/2017 1033 - Progressing by Kerrin Mo, RN Pain Managment: General experience of comfort will improve 05/02/2017 1033 - Progressing by Kerrin Mo, RN Safety: Ability to remain free from injury will improve 05/02/2017 1033 - Progressing by Kerrin Mo, RN Skin Integrity: Risk for impaired skin integrity will decrease 05/02/2017 1033 - Progressing by Kerrin Mo, RN

## 2017-05-02 NOTE — Progress Notes (Signed)
PROGRESS NOTE    Stacey Fernandez  YOV:[id number] DOB: July 06, 1929 DOA: 04/29/2017 PCP: Gildardo Cranker, DO   Brief Narrative:  The patient is an 84 year old female with dementia, COPD, history of lung cancer, chronic kidney disease stage III who presented with altered mental status and weakness.  She was found to be profoundly dehydrated with hypernatremia, constipation, and urinary tract infection.  Assessment & Plan:   Principal Problem:   UTI (urinary tract infection) Active Problems:   Hypernatremia   Acute cystitis   Hypokalemia  Metabolic encephalopathy thought secondary to dehydration and hypernatremia and urinary tract infection -N.p.o. For now, but on discussion with caregivers, seems to be close to baseline, will let them trial apple sauce with her.  Discussed risks of aspiration.  -Speech therapy following, appreciate recs -MRI head without acute intracranial abnormality -VBG with normal CO2  Concern for UTI, urine culture with multiple species Rocephin 1gm iv qday, will plan to treat for 5 days then stop  Dehydration with hypernatremia -Improved with D5W -CTM at current rate  Hypokalemia -Continue K in IVF -f/u mg prn  Fullness on abdominal exam and nurse reports that she has been using some liquid stool, concern for fecal impaction - KUB: Suggestive of impaction - s/p suppository  Thrombocytopenia: chronic, ctm  Hyperlipidemia Hold Lipitor  Dementia Hold Namenda  Glaucoma Cont eye drops  Osteoprosis HOLD Fosamax  Pressure injury: wound c/s in place.  Scar with healed pressure injury and small stage III pressure injury.   DVT prophylaxis: lovenox Code Status: DNR Family Communication: discussed with [place], brother and guardian/HCPOA - discussed that Mrs. Herst appears very ill with her significant AMS.  He notes she's been progressively declining over past month.   Disposition Plan: pending improvement  Consultants:    none  Procedures:   none  Antimicrobials: Anti-infectives (From admission, onward)   Start     Dose/Rate Route Frequency Ordered Stop   04/30/17 1900  cefTRIAXone (ROCEPHIN) 1 g in dextrose 5 % 50 mL IVPB     1 g 100 mL/hr over 30 Minutes Intravenous Every 24 hours 04/29/17 2056     04/29/17 1815  cefTRIAXone (ROCEPHIN) 1 g in dextrose 5 % 50 mL IVPB     1 g 100 mL/hr over 30 Minutes Intravenous  Once 04/29/17 1805 04/29/17 1942      Subjective: Unable to communicate.   Objective: Vitals:   05/01/17 0412 05/01/17 1326 05/01/17 2047 05/02/17 0452  BP: 127/62 107/68 95/63 (!) 142/82  Pulse: 76 68 64 66  Resp: 18 18 16 16   Temp: (!) 97 F (36.1 C) (!) 97.4 F (36.3 C) (!) 97.5 F (36.4 C) 98.1 F (36.7 C)  TempSrc: Axillary Oral Axillary Axillary  SpO2: 95% 98% 93% 100%  Weight: 42.2 kg (93 lb 0.6 oz)   43.9 kg (96 lb 12.5 oz)  Height:        Intake/Output Summary (Last 24 hours) at 05/02/2017 1603 Last data filed at 05/02/2017 1500 Gross per 24 hour  Intake 893.33 ml  Output -  Net 893.33 ml   Filed Weights   04/30/17 0444 05/01/17 0412 05/02/17 0452  Weight: 39.6 kg (87 lb 6.4 oz) 42.2 kg (93 lb 0.6 oz) 43.9 kg (96 lb 12.5 oz)    Examination:  General exam: Appears calm and comfortable  Respiratory system: Clear to auscultation. Respiratory effort normal. Cardiovascular system: S1 & S2 heard, RRR. No JVD, murmurs, rubs, gallops or clicks. No pedal edema. Gastrointestinal system: Abdomen is  nondistended, soft and nontender. No organomegaly or masses felt.  Central nervous system: Opens eyes to voice.  Speaks when family present.  Does not follow commands.  Extremities: no lee Skin: examined yesterday 1/16 (scar with small stage 3 decub) Psychiatry: Judgement and insight appear normal. Mood & affect appropriate.     Data Reviewed: I have personally reviewed following labs and imaging studies  CBC: Recent Labs  Lab 04/29/17 1428 04/30/17 0551  05/02/17 0539  WBC 10.3 9.9 6.7  NEUTROABS 8.1*  --   --   HGB 14.4 13.7 11.5*  HCT 46.4* 42.5 36.5  MCV 95.7 94.4 95.1  PLT 105* 106* 97*   Basic Metabolic Panel: Recent Labs  Lab 04/30/17 0551 04/30/17 0700 04/30/17 1525 05/01/17 0530 05/01/17 1612 05/02/17 0539  NA 152*  --  149* 145 141 141  K 2.6*  --  3.6 3.4* 3.4* 3.3*  CL 116*  --  121* 116* 113* 114*  CO2 28  --  23 25 23  21*  GLUCOSE 141*  --  120* 108* 92 82  BUN 25*  --  22* 20 19 17   CREATININE 1.02*  --  1.01* 0.90 0.92 0.88  CALCIUM 8.1*  --  7.7* 7.5* 7.3* 7.4*  MG  --  2.0  --   --   --  1.8  PHOS  --   --  1.9*  --   --   --    GFR: Estimated Creatinine Clearance: 31.8 mL/min (by C-G formula based on SCr of 0.88 mg/dL). Liver Function Tests: Recent Labs  Lab 04/30/17 0551 04/30/17 1525 05/01/17 0530  AST 25  --   --   ALT 19  --   --   ALKPHOS 72  --   --   BILITOT 0.3  --   --   PROT 6.6  --   --   ALBUMIN 3.0* 2.7* 2.5*   No results for input(s): LIPASE, AMYLASE in the last 168 hours. No results for input(s): AMMONIA in the last 168 hours. Coagulation Profile: No results for input(s): INR, PROTIME in the last 168 hours. Cardiac Enzymes: No results for input(s): CKTOTAL, CKMB, CKMBINDEX, TROPONINI in the last 168 hours. BNP (last 3 results) No results for input(s): PROBNP in the last 8760 hours. HbA1C: No results for input(s): HGBA1C in the last 72 hours. CBG: No results for input(s): GLUCAP in the last 168 hours. Lipid Profile: No results for input(s): CHOL, HDL, LDLCALC, TRIG, CHOLHDL, LDLDIRECT in the last 72 hours. Thyroid Function Tests: No results for input(s): TSH, T4TOTAL, FREET4, T3FREE, THYROIDAB in the last 72 hours. Anemia Panel: No results for input(s): VITAMINB12, FOLATE, FERRITIN, TIBC, IRON, RETICCTPCT in the last 72 hours. Sepsis Labs: No results for input(s): PROCALCITON, LATICACIDVEN in the last 168 hours.  Recent Results (from the past 240 hour(s))  Urine C&S      Status: Abnormal   Collection Time: 04/29/17  1:15 PM  Result Value Ref Range Status   Specimen Description URINE, CATHETERIZED  Final   Special Requests NONE  Final   Culture MULTIPLE SPECIES PRESENT, SUGGEST RECOLLECTION (Mayleigh Tetrault)  Final   Report Status 04/30/2017 FINAL  Final         Radiology Studies: Ct Head Wo Contrast  Result Date: 05/01/2017 CLINICAL DATA:  Altered level of consciousness EXAM: CT HEAD WITHOUT CONTRAST TECHNIQUE: Contiguous axial images were obtained from the base of the skull through the vertex without intravenous contrast. COMPARISON:  May 28, 2016 FINDINGS: Brain:  Mild to moderate diffuse atrophy is stable. There is no intracranial mass, hemorrhage, extra-axial fluid collection, or midline shift. There is patchy small vessel disease in the centra semiovale bilaterally as well as in both internal and external capsules. There is decreased attenuation in the anterior right lentiform nucleus superiorly compared to the left side, an appearance raising concern for potential recent infarct in this area. No other findings concerning for potential acute infarct evident. Vascular: No evident hyperdense vessel. There is calcification each carotid siphon, more on the left than on the right. Skull: Bony calvarium appears intact. Sinuses/Orbits: There is mucosal thickening in several ethmoid air cells. Other visualized paranasal sinuses are clear. Orbits appear symmetric bilaterally. Other: Mastoid air cells are clear. IMPRESSION: Decreased attenuation in the superior anterior right lentiform nucleus concerning for focal recent and possibly acute infarct in this area. No other findings suggesting potential recent or acute infarct. Elsewhere there is atrophy with supratentorial small vessel disease. No mass or hemorrhage. There are foci of arterial vascular calcification. Mucosal thickening noted in several ethmoid air cells. Electronically Signed   By: Lowella Grip III M.D.   On:  05/01/2017 14:28   Mr Brain Wo Contrast  Result Date: 05/01/2017 CLINICAL DATA:  Altered mental status EXAM: MRI HEAD WITHOUT CONTRAST TECHNIQUE: Multiplanar, multiecho pulse sequences of the brain and surrounding structures were obtained without intravenous contrast. COMPARISON:  Head CT 05/01/2017 FINDINGS: Brain: The midline structures are normal. There is no acute infarct or acute hemorrhage. No mass lesion, hydrocephalus, dural abnormality or extra-axial collection. There is moderate periventricular leukoaraiosis, most commonly seen in the setting of chronic ischemic microangiopathy. No age-advanced or lobar predominant atrophy. No chronic microhemorrhage or superficial siderosis. Vascular: Major intracranial arterial and venous sinus flow voids are preserved. Skull and upper cervical spine: The visualized skull base, calvarium, upper cervical spine and extracranial soft tissues are normal. Sinuses/Orbits: Bilateral mastoid effusions. Bilateral lens replacements. IMPRESSION: Sequelae of chronic ischemic microangiopathy without acute intracranial abnormality. Electronically Signed   By: Ulyses Jarred M.D.   On: 05/01/2017 22:02        Scheduled Meds: . dorzolamide  2 drop Right Eye BID  . enoxaparin (LOVENOX) injection  30 mg Subcutaneous Q24H  . latanoprost  1 drop Both Eyes QHS  . mouth rinse  15 mL Mouth Rinse BID   Continuous Infusions: . cefTRIAXone (ROCEPHIN)  IV Stopped (05/01/17 1914)  . dextrose 5 % with KCl 20 mEq / L 20 mEq (05/02/17 1312)     LOS: 3 days    Time spent: over 30 min    Fayrene Helper, MD [facility] Pager [phone]  If 7PM-7AM, please contact night-coverage [email or web address] Password [email or web address] 05/02/2017, 4:03 PM

## 2017-05-02 NOTE — Care Management Note (Signed)
Case Management Note  Patient Details  Name: Stacey Fernandez MRN: [id number] Date of Birth: October 08, 1929  Subjective/Objective:  84 y/o f admitted w/UTI. Hx: DNR, dementia. From home w/custodial level aides. RD following.                  Action/Plan:d/c plan home.   Expected Discharge Date:  (unknown)               Expected Discharge Plan:  Home/Self Care  In-House Referral:     Discharge planning Services  CM Consult  Post Acute Care Choice:  Resumption of Svcs/PTA Provider(custodial level aides) Choice offered to:     DME Arranged:    DME Agency:     HH Arranged:    [place] Agency:     Status of Service:  In process, will continue to follow  If discussed at Long Length of Stay Meetings, dates discussed:    Additional Comments:  Dessa Phi, RN 05/02/2017, 10:51 AM

## 2017-05-03 ENCOUNTER — Ambulatory Visit: Payer: Medicare Other | Admitting: Internal Medicine

## 2017-05-03 DIAGNOSIS — Z7189 Other specified counseling: Secondary | ICD-10-CM

## 2017-05-03 DIAGNOSIS — F0390 Unspecified dementia without behavioral disturbance: Secondary | ICD-10-CM

## 2017-05-03 DIAGNOSIS — Z515 Encounter for palliative care: Secondary | ICD-10-CM

## 2017-05-03 DIAGNOSIS — N3000 Acute cystitis without hematuria: Principal | ICD-10-CM

## 2017-05-03 LAB — BASIC METABOLIC PANEL
Anion gap: 6 (ref 5–15)
BUN: 16 mg/dL (ref 6–20)
CHLORIDE: 112 mmol/L — AB (ref 101–111)
CO2: 22 mmol/L (ref 22–32)
CREATININE: 0.88 mg/dL (ref 0.44–1.00)
Calcium: 7.6 mg/dL — ABNORMAL LOW (ref 8.9–10.3)
GFR calc Af Amer: >60 mL/min (ref >60)
GFR calc non Af Amer: 58 mL/min — ABNORMAL LOW (ref >60)
Glucose, Bld: 91 mg/dL (ref 65–99)
Potassium: 3.6 mmol/L (ref 3.5–5.1)
SODIUM: 140 mmol/L (ref 135–145)

## 2017-05-03 LAB — MAGNESIUM: MAGNESIUM: 1.8 mg/dL (ref 1.7–2.4)

## 2017-05-03 NOTE — Progress Notes (Signed)
PT Cancellation Note  Patient Details Name: Stacey Fernandez MRN: [id number] DOB: 1930/01/27   Cancelled Treatment:    Reason Eval/Treat Not Completed: PT screened, no needs identified, will sign off; pt is total care, 24hr caregivers at home;    [facility] 05/03/2017, 9:51 AM

## 2017-05-03 NOTE — Progress Notes (Addendum)
PROGRESS NOTE    Stacey Fernandez  UJW:[id number] DOB: 03-30-1930 DOA: 04/29/2017 PCP: Gildardo Cranker, DO   Brief Narrative:  The patient is an 84 year old female with dementia, COPD, history of lung cancer, chronic kidney disease stage III who presented with altered mental status and weakness.  She was found to be profoundly dehydrated with hypernatremia, constipation, and urinary tract infection.  Assessment & Plan:   Principal Problem:   UTI (urinary tract infection) Active Problems:   Hypernatremia   Acute cystitis   Hypokalemia  Metabolic encephalopathy thought secondary to dehydration and hypernatremia and urinary tract infection -Seems to be improving.   -Speech therapy following, appreciate recs -MRI head without acute intracranial abnormality - EEG without seizure activity -VBG with normal CO2  Concern for UTI, urine culture with multiple species Rocephin 1gm iv qday, will plan to treat for 5 days then stop  Dehydration with hypernatremia -Improved with D5W -CTM at current rate  Hypokalemia -Continue K in IVF -f/u mg prn  Fullness on abdominal exam and nurse reports that she has been using some liquid stool, concern for fecal impaction - KUB: Suggestive of impaction - s/p suppository  Thrombocytopenia: chronic, ctm  Hyperlipidemia Hold Lipitor  Dementia Holding Namenda  Glaucoma Cont eye drops  Osteoprosis HOLD Fosamax  Pressure injury: wound c/s in place.  Scar with healed pressure injury and small stage III pressure injury.   Palliative care c/s for [place] conversation with her significant dementia and poor baseline status and high likelihood of possible readmission.  DVT prophylaxis: lovenox Code Status: DNR Family Communication: none at bedside today  Disposition Plan: pending improvement  Consultants:   none  Procedures:   none  Antimicrobials: Anti-infectives (From admission, onward)   Start     Dose/Rate Route Frequency  Ordered Stop   04/30/17 1900  cefTRIAXone (ROCEPHIN) 1 g in dextrose 5 % 50 mL IVPB     1 g 100 mL/hr over 30 Minutes Intravenous Every 24 hours 04/29/17 2056 05/05/17 1859   04/29/17 1815  cefTRIAXone (ROCEPHIN) 1 g in dextrose 5 % 50 mL IVPB     1 g 100 mL/hr over 30 Minutes Intravenous  Once 04/29/17 1805 04/29/17 1942      Subjective: Opens eyes to voice. Says she's doing "alright".   Objective: Vitals:   05/02/17 1301 05/02/17 2004 05/03/17 0507 05/03/17 1304  BP: 115/60 136/85 120/86 (!) 120/91  Pulse: 60  69 66  Resp: 18 18 18 18   Temp: 98.5 F (36.9 C) 98.2 F (36.8 C) 98.8 F (37.1 C) (!) 97.3 F (36.3 C)  TempSrc: Axillary Axillary Axillary Axillary  SpO2: 100% 100% 100% 98%  Weight:   43 kg (94 lb 12.8 oz)   Height:        Intake/Output Summary (Last 24 hours) at 05/03/2017 1547 Last data filed at 05/03/2017 1400 Gross per 24 hour  Intake 732.83 ml  Output -  Net 732.83 ml   Filed Weights   05/01/17 0412 05/02/17 0452 05/03/17 0507  Weight: 42.2 kg (93 lb 0.6 oz) 43.9 kg (96 lb 12.5 oz) 43 kg (94 lb 12.8 oz)    Examination:  General: No acute distress. Cardiovascular: Heart sounds show a regular rate, and rhythm. No gallops or rubs. No murmurs. No JVD. Lungs: Clear to auscultation bilaterally with good air movement. No rales, rhonchi or wheezes. Abdomen: Soft, nontender, nondistended with normal active bowel sounds. No masses. No hepatosplenomegaly. Neurological: Responds only to voice, opens eyes, says "alright" Skin: sacrum  not examined today Extremities: No clubbing or cyanosis. No edema.   Data Reviewed: I have personally reviewed following labs and imaging studies  CBC: Recent Labs  Lab 04/29/17 1428 04/30/17 0551 05/02/17 0539  WBC 10.3 9.9 6.7  NEUTROABS 8.1*  --   --   HGB 14.4 13.7 11.5*  HCT 46.4* 42.5 36.5  MCV 95.7 94.4 95.1  PLT 105* 106* 97*   Basic Metabolic Panel: Recent Labs  Lab 04/30/17 0700 04/30/17 1525  05/01/17 0530 05/01/17 1612 05/02/17 0539 05/03/17 0521  NA  --  149* 145 141 141 140  K  --  3.6 3.4* 3.4* 3.3* 3.6  CL  --  121* 116* 113* 114* 112*  CO2  --  23 25 23  21* 22  GLUCOSE  --  120* 108* 92 82 91  BUN  --  22* 20 19 17 16   CREATININE  --  1.01* 0.90 0.92 0.88 0.88  CALCIUM  --  7.7* 7.5* 7.3* 7.4* 7.6*  MG 2.0  --   --   --  1.8 1.8  PHOS  --  1.9*  --   --   --   --    GFR: Estimated Creatinine Clearance: 31.2 mL/min (by C-G formula based on SCr of 0.88 mg/dL). Liver Function Tests: Recent Labs  Lab 04/30/17 0551 04/30/17 1525 05/01/17 0530  AST 25  --   --   ALT 19  --   --   ALKPHOS 72  --   --   BILITOT 0.3  --   --   PROT 6.6  --   --   ALBUMIN 3.0* 2.7* 2.5*   No results for input(s): LIPASE, AMYLASE in the last 168 hours. No results for input(s): AMMONIA in the last 168 hours. Coagulation Profile: No results for input(s): INR, PROTIME in the last 168 hours. Cardiac Enzymes: No results for input(s): CKTOTAL, CKMB, CKMBINDEX, TROPONINI in the last 168 hours. BNP (last 3 results) No results for input(s): PROBNP in the last 8760 hours. HbA1C: No results for input(s): HGBA1C in the last 72 hours. CBG: No results for input(s): GLUCAP in the last 168 hours. Lipid Profile: No results for input(s): CHOL, HDL, LDLCALC, TRIG, CHOLHDL, LDLDIRECT in the last 72 hours. Thyroid Function Tests: No results for input(s): TSH, T4TOTAL, FREET4, T3FREE, THYROIDAB in the last 72 hours. Anemia Panel: No results for input(s): VITAMINB12, FOLATE, FERRITIN, TIBC, IRON, RETICCTPCT in the last 72 hours. Sepsis Labs: No results for input(s): PROCALCITON, LATICACIDVEN in the last 168 hours.  Recent Results (from the past 240 hour(s))  Urine C&S     Status: Abnormal   Collection Time: 04/29/17  1:15 PM  Result Value Ref Range Status   Specimen Description URINE, CATHETERIZED  Final   Special Requests NONE  Final   Culture MULTIPLE SPECIES PRESENT, SUGGEST RECOLLECTION  (A)  Final   Report Status 04/30/2017 FINAL  Final         Radiology Studies: Mr Brain Wo Contrast  Result Date: 05/01/2017 CLINICAL DATA:  Altered mental status EXAM: MRI HEAD WITHOUT CONTRAST TECHNIQUE: Multiplanar, multiecho pulse sequences of the brain and surrounding structures were obtained without intravenous contrast. COMPARISON:  Head CT 05/01/2017 FINDINGS: Brain: The midline structures are normal. There is no acute infarct or acute hemorrhage. No mass lesion, hydrocephalus, dural abnormality or extra-axial collection. There is moderate periventricular leukoaraiosis, most commonly seen in the setting of chronic ischemic microangiopathy. No age-advanced or lobar predominant atrophy. No chronic microhemorrhage or superficial  siderosis. Vascular: Major intracranial arterial and venous sinus flow voids are preserved. Skull and upper cervical spine: The visualized skull base, calvarium, upper cervical spine and extracranial soft tissues are normal. Sinuses/Orbits: Bilateral mastoid effusions. Bilateral lens replacements. IMPRESSION: Sequelae of chronic ischemic microangiopathy without acute intracranial abnormality. Electronically Signed   By: Ulyses Jarred M.D.   On: 05/01/2017 22:02        Scheduled Meds: . dorzolamide  2 drop Right Eye BID  . enoxaparin (LOVENOX) injection  30 mg Subcutaneous Q24H  . latanoprost  1 drop Both Eyes QHS  . mouth rinse  15 mL Mouth Rinse BID   Continuous Infusions: . cefTRIAXone (ROCEPHIN)  IV Stopped (05/02/17 1846)  . dextrose 5 % with KCl 20 mEq / L 20 mEq (05/02/17 1312)     LOS: 4 days    Time spent: over 30 min    Fayrene Helper, MD [facility] Pager [phone]  If 7PM-7AM, please contact night-coverage [email or web address] Password [facility] 05/03/2017, 3:47 PM

## 2017-05-03 NOTE — Progress Notes (Signed)
  Speech Language Pathology Treatment: Dysphagia  Patient Details Name: Stacey Fernandez MRN: [id number] DOB: 07-24-1929 Today's Date: 05/03/2017 Time: 8115-7262 SLP Time Calculation (min) (ACUTE ONLY): 28 min  Assessment / Plan / Recommendation Clinical Impression  Pt demonstrating improved mentation today and niece present reports pt is at baseline.  She states pt "plays possum" at times.  Pt willing to accept intake of cracker, applesauce, peaches, soda and water.  No indications of airway compromise.  Oral deficits suspected that are significant with swallow deficits in pt's with dementia.  Oral hold with hard fruit and suspected delay in transit noted.  Niece present and reports pt's swallow to be near baseline- recommend dys3/ground meat/thin with strict precautions.    Using teach back, educated niece to aspiration precautions, gustatory changes with dementia, use of spoon to mouth to assure oral cavity clear, using puree to clear solid oral residuals.    Ms Freda Munro is mindful of pt's swallowing and per SLP observation uses precautions with feeding pt!   Thankful this pt has such great caregivers.     HPI HPI: 84 yo female with h/o UTI, AMS, lethargy - PMH + for lung mass, GERD, dementia, FTT, prior palliative encounter.  Swallow evaluation ordered for today.  No family present.        SLP Plan  Continue with current plan of care       Recommendations  Diet recommendations: Dysphagia 3 (mechanical soft);Thin liquid Liquids provided via: Cup;Straw Medication Administration: Whole meds with puree Supervision: Full supervision/cueing for compensatory strategies;Staff to assist with self feeding Compensations: Minimize environmental distractions;Slow rate;Small sips/bites;Other (Comment)(assure pt swallows, check for residuals, use puree to clear mouth if needed) Postural Changes and/or Swallow Maneuvers: Seated upright 90 degrees;Upright 30-60 min after meal                Oral Care Recommendations: (oral care after meals) Follow up Recommendations: None SLP Visit Diagnosis: Dysphagia, oral phase (R13.11) Plan: Continue with current plan of care       GO                Macario Golds 05/03/2017, 11:48 AM [name], [facility] SLP [phone]

## 2017-05-03 NOTE — Plan of Care (Signed)
  Progressing Health Behavior/Discharge Planning: Ability to manage health-related needs will improve 05/03/2017 0957 - Progressing by Kerrin Mo, RN Clinical Measurements: Will remain free from infection 05/03/2017 0957 - Progressing by Kerrin Mo, RN Note On IV abx Diagnostic test results will improve 05/03/2017 0957 - Progressing by Kerrin Mo, RN Activity: Risk for activity intolerance will decrease 05/03/2017 0957 - Progressing by Kerrin Mo, RN Nutrition: Adequate nutrition will be maintained 05/03/2017 0957 - Progressing by Kerrin Mo, RN Note Await eval by SLP to advance diet.  Safety: Ability to remain free from injury will improve 05/03/2017 0957 - Progressing by Kerrin Mo, RN Skin Integrity: Risk for impaired skin integrity will decrease 05/03/2017 0957 - Progressing by Kerrin Mo, RN

## 2017-05-04 LAB — CBC
HEMATOCRIT: 38.6 % (ref 36.0–46.0)
Hemoglobin: 12.6 g/dL (ref 12.0–15.0)
MCH: 29.9 pg (ref 26.0–34.0)
MCHC: 32.6 g/dL (ref 30.0–36.0)
MCV: 91.7 fL (ref 78.0–100.0)
Platelets: 105 10*3/uL — ABNORMAL LOW (ref 150–400)
RBC: 4.21 MIL/uL (ref 3.87–5.11)
RDW: 16 % — AB (ref 11.5–15.5)
WBC: 4.3 10*3/uL (ref 4.0–10.5)

## 2017-05-04 LAB — BASIC METABOLIC PANEL
ANION GAP: 6 (ref 5–15)
BUN: 13 mg/dL (ref 6–20)
CO2: 22 mmol/L (ref 22–32)
Calcium: 7.5 mg/dL — ABNORMAL LOW (ref 8.9–10.3)
Chloride: 108 mmol/L (ref 101–111)
Creatinine, Ser: 0.87 mg/dL (ref 0.44–1.00)
GFR calc Af Amer: >60 mL/min (ref >60)
GFR, EST NON AFRICAN AMERICAN: 59 mL/min — AB (ref >60)
Glucose, Bld: 80 mg/dL (ref 65–99)
POTASSIUM: 3.7 mmol/L (ref 3.5–5.1)
SODIUM: 136 mmol/L (ref 135–145)

## 2017-05-04 NOTE — Plan of Care (Signed)
  Adequate for Discharge Health Behavior/Discharge Planning: Ability to manage health-related needs will improve 05/04/2017 1219 - Adequate for Discharge by Kerrin Mo, RN Clinical Measurements: Will remain free from infection 05/04/2017 1219 - Adequate for Discharge by Kerrin Mo, RN Diagnostic test results will improve 05/04/2017 1219 - Adequate for Discharge by Kerrin Mo, RN Activity: Risk for activity intolerance will decrease 05/04/2017 1219 - Adequate for Discharge by Kerrin Mo, RN Nutrition: Adequate nutrition will be maintained 05/04/2017 1219 - Adequate for Discharge by Kerrin Mo, RN Safety: Ability to remain free from injury will improve 05/04/2017 1219 - Adequate for Discharge by Kerrin Mo, RN

## 2017-05-04 NOTE — Discharge Summary (Signed)
Physician Discharge Summary  Stacey Fernandez RKY:[id number] DOB: [birth year] DOA: 04/29/2017  PCP: Gildardo Cranker, DO  Admit date: 04/29/2017 Discharge date: 05/04/2017  Time spent: over 30 minutes  Recommendations for Outpatient Follow-up:  1. Follow up outpatient CBC/CMP 2. Consider palliative care referral per PCP as outpatient 3. Follow up decubitus ulcer healing   Discharge Diagnoses:  Principal Problem:   UTI (urinary tract infection) Active Problems:   Hypernatremia   Acute cystitis   Hypokalemia   Discharge Condition: stable  Diet recommendation: as noted below  [facility] Weights   05/02/17 0452 05/03/17 0507 05/04/17 0418  Weight: 43.9 kg (96 lb 12.5 oz) 43 kg (94 lb 12.8 oz) 44.3 kg (97 lb 10.6 oz)    History of present illness:  The patient is an 84 year old female with dementia, COPD, history of lung cancer, chronic kidney disease stage III who presented with altered mental status and weakness. She was found to be profoundly dehydrated with hypernatremia, constipation, and urinary tract infection.  She gradually improved with IV hydration and antibiotics.  Palliative was consulted given her advanced dementia possible hospice candidate.  At this time, family wants DNR, but otherwise full scope interventions.    Hospital Course:  Metabolic encephalopathy thought secondary to dehydration and hypernatremia and urinary tract infection -Seems to be improving.   -Speech therapy following, appreciate recs -MRI head without acute intracranial abnormality -EEG without seizure activity -VBG with normal CO2  Concern for UTI,urine culture with multiple species Rocephin 1gm iv qday, s/p 5 days abx  Dehydrationwith hypernatremia -improved with IVF (D5W)  Hypokalemia improved  Fullness on abdominal exam and nurse reports that she has been using some liquid stool, concern for fecal impaction -BTD:[id number] of impaction - s/p suppository, pt with BM  today  Thrombocytopenia: chronic, ctm  Hyperlipidemia Resume at d/c   Dementia resume Namenda  Glaucoma Cont eye drops  Osteoprosis Resume Fosamax at d/c  Pressure injury: wound c/s in place, appreciate recs (note below).  Scar with healed pressure injury and small stage III pressure injury.   Palliative care c/s for [place] conversation:  Recommended DNR, otherwise full scope interventions.    Procedures: 05/04/17 EEG EEG Report  Clinical History:  Altered mental status in the setting of dementia, UTI, and dehydration with hypernatremia.    Technical Summary:  A 19 channel digital EEG recording was performed using the 10-20 international system of electrode placement.  Bipolar and Referential montages were used.  The total recording time was approx 20 minutes.  Findings:  There is no posterior dominant alpha rhythm.  Posterior frequencies are about 7 Hz and symmetrical.   No focal slowing is present.  Sleep is not recorded.  There are no epileptiform discharges or electrographic seizures present.    Impression:  This is an abnormal EEG.  There is evidence of mild generalized slowing of brain activity which may be due to underlying dementia and/or metabolic encephalopathy.  The patient is not in non-convulsive status epilepticus.    Consultations:  Palliative care  Discharge Exam: Vitals:   05/03/17 2050 05/04/17 0418  BP: 130/90 (!) 144/90  Pulse: 92 69  Resp: 20 20  Temp: 97.9 F (36.6 C) 97.8 F (36.6 C)  SpO2: 100% 100%   Opens eyes to voice.  Brother Rusty Aus says he saw her yesterday, felt she was back to baseline.  Niece also feels she's back to baseline.   General: No acute distress. Cardiovascular: Heart sounds show a regular rate, and rhythm. No gallops or  rubs. No murmurs. No JVD. Lungs: Clear to auscultation bilaterally with good air movement. No rales, rhonchi or wheezes. Abdomen: Soft, nontender, nondistended with normal active bowel sounds. No  masses. No hepatosplenomegaly. Neurological: Opens eyes to voice, doesn't say much today. Skin:  Previously healed decubitus ulcer and small stage 3 sacral decub on buttocks Extremities: No clubbing or cyanosis. No edema.   Discharge Instructions   Discharge Instructions    Call MD for:  extreme fatigue   Complete by:  As directed    Call MD for:  persistant dizziness or light-headedness   Complete by:  As directed    Call MD for:  persistant nausea and vomiting   Complete by:  As directed    Call MD for:  redness, tenderness, or signs of infection (pain, swelling, redness, odor or green/yellow discharge around incision site)   Complete by:  As directed    Call MD for:  severe uncontrolled pain   Complete by:  As directed    Call MD for:  temperature >100.4   Complete by:  As directed    DIET DYS 3   Complete by:  As directed    Fluid consistency:  Thin   Discharge diet:   Complete by:  As directed    Diet recommendations: Dysphagia 3 (mechanical soft);Thin liquid Liquids provided via: Cup;Straw Medication Administration: Whole meds with puree Supervision: Full supervision/cueing for compensatory strategies;Staff to assist with self feeding Compensations: Minimize environmental distractions;Slow rate;Small sips/bites;Other (Comment)(assure pt swallows, check for residuals, use puree to clear mouth if needed) Postural Changes and/or Swallow Maneuvers: Seated upright 90 degrees;Upright 30-60 min after meal   Discharge instructions   Complete by:  As directed    Stacey Fernandez was seen for dehydration and a possible UTI.  She had an MRI which did not show any new stroke and she had an EEG which did not show seizure activity.  She gradually improved with IV fluids and antibiotics.  She was seen by speech therapy who recommended a modified diet.  She was seen by wound care who provided instructions for care of her decubitus ulcer.  She was also seen by palliative care who provided some  resources for the family.  Please continue to follow up with Stacey Fernandez's PCP regarding goals of care and potentially hospice services if these are desired.  Return for new, worsening, or recurrent symptoms.  Please have your PCP request records from this hospitalization so they know what was done and the next steps.   Discharge wound care:   Complete by:  As directed    Wound care to Stage 3 Pressure Injury recurring on a previously healed full thickness pressure injury (scar):  Cleanse with normal saline, follow with thin layer of moisture barrier cream following each turning and repositioning effort.  Foam dressing.  Turn side to side and minimize time spent in the supine position. Use only one layer of linen beneath patient.   Increase activity slowly   Complete by:  As directed    Increase activity slowly   Complete by:  As directed      Allergies as of 05/04/2017   No Known Allergies     Medication List    TAKE these medications   alendronate 70 MG tablet Commonly known as:  FOSAMAX Take 1 tablet (70 mg total) by mouth once a week. Take with a full glass of water on an empty stomach.   aspirin EC 81 MG tablet Take 81 mg by  mouth at bedtime.   atorvastatin 10 MG tablet Commonly known as:  LIPITOR TAKE 1 TABLET BY MOUTH EVERY DAY What changed:    how much to take  how to take this  when to take this   CALTRATE 600+D 600-400 MG-UNIT tablet Generic drug:  Calcium Carbonate-Vitamin D Take 1 tablet by mouth 2 (two) times daily.   latanoprost 0.005 % ophthalmic solution Commonly known as:  XALATAN PLACE 1 DROP INTO BOTH EYES AT BEDTIME. What changed:  Another medication with the same name was removed. Continue taking this medication, and follow the directions you see here.   memantine 28 MG Cp24 24 hr capsule Commonly known as:  NAMENDA XR Take 1 capsule (28 mg total) by mouth daily.   mirtazapine 15 MG tablet Commonly known as:  REMERON TAKE 1 TABLET (15 MG TOTAL) BY  MOUTH AT BEDTIME.   sodium chloride 0.65 % Soln nasal spray Commonly known as:  OCEAN Place 1 spray into both nostrils as needed for congestion.   TRUSOPT 2 % ophthalmic solution Generic drug:  dorzolamide Place 2 drops into the right eye 2 (two) times daily.            Discharge Care Instructions  (From admission, onward)        Start     Ordered   05/04/17 0000  Discharge wound care:    Comments:  Wound care to Stage 3 Pressure Injury recurring on a previously healed full thickness pressure injury (scar):  Cleanse with normal saline, follow with thin layer of moisture barrier cream following each turning and repositioning effort.  Foam dressing.  Turn side to side and minimize time spent in the supine position. Use only one layer of linen beneath patient.   05/04/17 1100     No Known Allergies    The results of significant diagnostics from this hospitalization (including imaging, microbiology, ancillary and laboratory) are listed below for reference.    Significant Diagnostic Studies: Ct Head Wo Contrast  Result Date: 05/01/2017 CLINICAL DATA:  Altered level of consciousness EXAM: CT HEAD WITHOUT CONTRAST TECHNIQUE: Contiguous axial images were obtained from the base of the skull through the vertex without intravenous contrast. COMPARISON:  May 28, 2016 FINDINGS: Brain: Mild to moderate diffuse atrophy is stable. There is no intracranial mass, hemorrhage, extra-axial fluid collection, or midline shift. There is patchy small vessel disease in the centra semiovale bilaterally as well as in both internal and external capsules. There is decreased attenuation in the anterior right lentiform nucleus superiorly compared to the left side, an appearance raising concern for potential recent infarct in this area. No other findings concerning for potential acute infarct evident. Vascular: No evident hyperdense vessel. There is calcification each carotid siphon, more on the left than on  the right. Skull: Bony calvarium appears intact. Sinuses/Orbits: There is mucosal thickening in several ethmoid air cells. Other visualized paranasal sinuses are clear. Orbits appear symmetric bilaterally. Other: Mastoid air cells are clear. IMPRESSION: Decreased attenuation in the superior anterior right lentiform nucleus concerning for focal recent and possibly acute infarct in this area. No other findings suggesting potential recent or acute infarct. Elsewhere there is atrophy with supratentorial small vessel disease. No mass or hemorrhage. There are foci of arterial vascular calcification. Mucosal thickening noted in several ethmoid air cells. Electronically Signed   By: Lowella Grip III M.D.   On: 05/01/2017 14:28   Mr Brain Wo Contrast  Result Date: 05/01/2017 CLINICAL DATA:  Altered mental status EXAM:  MRI HEAD WITHOUT CONTRAST TECHNIQUE: Multiplanar, multiecho pulse sequences of the brain and surrounding structures were obtained without intravenous contrast. COMPARISON:  Head CT 05/01/2017 FINDINGS: Brain: The midline structures are normal. There is no acute infarct or acute hemorrhage. No mass lesion, hydrocephalus, dural abnormality or extra-axial collection. There is moderate periventricular leukoaraiosis, most commonly seen in the setting of chronic ischemic microangiopathy. No age-advanced or lobar predominant atrophy. No chronic microhemorrhage or superficial siderosis. Vascular: Major intracranial arterial and venous sinus flow voids are preserved. Skull and upper cervical spine: The visualized skull base, calvarium, upper cervical spine and extracranial soft tissues are normal. Sinuses/Orbits: Bilateral mastoid effusions. Bilateral lens replacements. IMPRESSION: Sequelae of chronic ischemic microangiopathy without acute intracranial abnormality. Electronically Signed   By: Ulyses Jarred M.D.   On: 05/01/2017 22:02   Dg Abd Portable 1v  Result Date: 04/30/2017 CLINICAL DATA:  Nausea and  vomiting, chronic renal insufficiency stage II. EXAM: PORTABLE ABDOMEN - 1 VIEW COMPARISON:  Abdominal radiograph of Sep 13, 2015 FINDINGS: The stool burden within the rectum is increased. There is a small amount of stool within the descending colon and scattered amounts of stool in the ascending and transverse colon. There is no small bowel obstructive pattern. There is no free extraluminal gas. The bony structures are unremarkable. IMPRESSION: Findings compatible with a fecal impaction in the appropriate clinical setting. No other acute intra-abdominal abnormality is observed. Electronically Signed   By: David  Martinique M.D.   On: 04/30/2017 10:47    Microbiology: Recent Results (from the past 240 hour(s))  Urine C&S     Status: Abnormal   Collection Time: 04/29/17  1:15 PM  Result Value Ref Range Status   Specimen Description URINE, CATHETERIZED  Final   Special Requests NONE  Final   Culture MULTIPLE SPECIES PRESENT, SUGGEST RECOLLECTION (A)  Final   Report Status 04/30/2017 FINAL  Final     Labs: Basic Metabolic Panel: Recent Labs  Lab 04/30/17 0700 04/30/17 1525 05/01/17 0530 05/01/17 1612 05/02/17 0539 05/03/17 0521 05/04/17 0644  NA  --  149* 145 141 141 140 136  K  --  3.6 3.4* 3.4* 3.3* 3.6 3.7  CL  --  121* 116* 113* 114* 112* 108  CO2  --  23 25 23  21* 22 22  GLUCOSE  --  120* 108* 92 82 91 80  BUN  --  22* 20 19 17 16 13   CREATININE  --  1.01* 0.90 0.92 0.88 0.88 0.87  CALCIUM  --  7.7* 7.5* 7.3* 7.4* 7.6* 7.5*  MG 2.0  --   --   --  1.8 1.8  --   PHOS  --  1.9*  --   --   --   --   --    Liver Function Tests: Recent Labs  Lab 04/30/17 0551 04/30/17 1525 05/01/17 0530  AST 25  --   --   ALT 19  --   --   ALKPHOS 72  --   --   BILITOT 0.3  --   --   PROT 6.6  --   --   ALBUMIN 3.0* 2.7* 2.5*   No results for input(s): LIPASE, AMYLASE in the last 168 hours. No results for input(s): AMMONIA in the last 168 hours. CBC: Recent Labs  Lab 04/29/17 1428  04/30/17 0551 05/02/17 0539 05/04/17 0644  WBC 10.3 9.9 6.7 4.3  NEUTROABS 8.1*  --   --   --   HGB 14.4 13.7 11.5*  12.6  HCT 46.4* 42.5 36.5 38.6  MCV 95.7 94.4 95.1 91.7  PLT 105* 106* 97* 105*   Cardiac Enzymes: No results for input(s): CKTOTAL, CKMB, CKMBINDEX, TROPONINI in the last 168 hours. BNP: BNP (last 3 results) No results for input(s): BNP in the last 8760 hours.  ProBNP (last 3 results) No results for input(s): PROBNP in the last 8760 hours.  CBG: No results for input(s): GLUCAP in the last 168 hours.     Signed:  Fayrene Helper MD.  [facility] 05/04/2017, 6:56 PM

## 2017-05-04 NOTE — Discharge Instructions (Signed)
Urinary Tract Infection, Adult A urinary tract infection (UTI) is an infection of any part of the urinary tract, which includes the kidneys, ureters, bladder, and urethra. These organs make, store, and get rid of urine in the body. UTI can be a bladder infection (cystitis) or kidney infection (pyelonephritis). What are the causes? This infection may be caused by fungi, viruses, or bacteria. Bacteria are the most common cause of UTIs. This condition can also be caused by repeated incomplete emptying of the bladder during urination. What increases the risk? This condition is more likely to develop if:  You ignore your need to urinate or hold urine for long periods of time.  You do not empty your bladder completely during urination.  You wipe back to front after urinating or having a bowel movement, if you are female.  You are uncircumcised, if you are female.  You are constipated.  You have a urinary catheter that stays in place (indwelling).  You have a weak defense (immune) system.  You have a medical condition that affects your bowels, kidneys, or bladder.  You have diabetes.  You take antibiotic medicines frequently or for long periods of time, and the antibiotics no longer work well against certain types of infections (antibiotic resistance).  You take medicines that irritate your urinary tract.  You are exposed to chemicals that irritate your urinary tract.  You are female.  What are the signs or symptoms? Symptoms of this condition include:  Fever.  Frequent urination or passing small amounts of urine frequently.  Needing to urinate urgently.  Pain or burning with urination.  Urine that smells bad or unusual.  Cloudy urine.  Pain in the lower abdomen or back.  Trouble urinating.  Blood in the urine.  Vomiting or being less hungry than normal.  Diarrhea or abdominal pain.  Vaginal discharge, if you are female.  How is this diagnosed? This condition is  diagnosed with a medical history and physical exam. You will also need to provide a urine sample to test your urine. Other tests may be done, including:  Blood tests.  Sexually transmitted disease (STD) testing.  If you have had more than one UTI, a cystoscopy or imaging studies may be done to determine the cause of the infections. How is this treated? Treatment for this condition often includes a combination of two or more of the following:  Antibiotic medicine.  Other medicines to treat less common causes of UTI.  Over-the-counter medicines to treat pain.  Drinking enough water to stay hydrated.  Follow these instructions at home:  Take over-the-counter and prescription medicines only as told by your health care provider.  If you were prescribed an antibiotic, take it as told by your health care provider. Do not stop taking the antibiotic even if you start to feel better.  Avoid alcohol, caffeine, tea, and carbonated beverages. They can irritate your bladder.  Drink enough fluid to keep your urine clear or pale yellow.  Keep all follow-up visits as told by your health care provider. This is important.  Make sure to: ? Empty your bladder often and completely. Do not hold urine for long periods of time. ? Empty your bladder before and after sex. ? Wipe from front to back after a bowel movement if you are female. Use each tissue one time when you wipe. Contact a health care provider if:  You have back pain.  You have a fever.  You feel nauseous or vomit.  Your symptoms do not  get better after 3 days.  Your symptoms go away and then return. Get help right away if:  You have severe back pain or lower abdominal pain.  You are vomiting and cannot keep down any medicines or water. This information is not intended to replace advice given to you by your health care provider. Make sure you discuss any questions you have with your health care provider. Document Released:  01/10/2005 Document Revised: 09/14/2015 Document Reviewed: 02/21/2015 Elsevier Interactive Patient Education  2018 [employer].   Dehydration Dehydration is a condition in which there is not enough fluid or water in the body. This happens when you lose more fluids than you take in. Important organs, such as the kidneys, brain, and heart, cannot function without a proper amount of fluids. Any loss of fluids from the body can lead to dehydration. People age 65 or older have a higher risk of dehydration than younger adults because in older age, the body:  Is less able to conserve water.  Does not respond to temperature changes as well.  Does not get thirsty as easily or quickly.  Dehydration can range from mild to severe. This condition should be treated right away to prevent it from becoming severe. What are the causes? Dehydration may be caused by:  Vomiting.  Diarrhea.  Excessive sweating, such as from heat exposure or exercise.  Not drinking enough fluid, especially: ? When ill. ? While doing activity that requires a lot of energy.  Excessive urination.  Fever.  Infection.  Certain medicines, such as medicines that cause the body to lose excess fluid (diuretics).  Inability to access safe drinking water.  Poorly controlled blood sugars.  Reduced physical ability to get adequate water and food.  What increases the risk? This condition is more likely to develop in people who:  Have a long-term (chronic) illness, such as: ? An illness that may increase urination, such as diabetes. ? Kidney, heart, or lung disease. ? Neurological or psychological disorders, such as dementia.  Are age 24 or older.  Are disabled.  Live in a place with high altitude.  What are the signs or symptoms? Symptoms of mild dehydration may include:  Thirst.  Dry lips.  Slightly dry mouth.  Dry, warm skin.  Dizziness. Symptoms of moderate dehydration may include:  Very dry  mouth.  Muscle cramps.  Dark urine. Urine may be the color of tea.  Decreased urine production.  Decreased tear production.  Heartbeat that is irregular or faster than normal (palpitations).  Headache.  Light-headedness, especially when you stand up from a sitting position.  Fainting (syncope). Symptoms of severe dehydration may include:  Changes in skin, such as: ? Cold and clammy skin. ? Blotchy (mottled) or pale skin. ? Skin that does not quickly return to normal after being lightly pinched and released (poor skin turgor).  Changes in body fluids, such as: ? Extreme thirst. ? No tear production. ? Inability to sweat when body temperature is high, such as in hot weather. ? Very little urine production.  Changes in vital signs, such as: ? Weak pulse. ? Pulse that is more than 100 beats a minute when sitting still. ? Rapid breathing. ? Low blood pressure.  Other changes, such as: ? Sunken eyes. ? Cold hands and feet. ? Confusion. ? Lack of energy (lethargy). ? Difficulty waking up from sleep. ? Short-term weight loss. ? Unconsciousness. How is this diagnosed? This condition is diagnosed based on your symptoms and a physical exam. Blood  and urine tests may be done to help confirm the diagnosis. How is this treated? Treatment for this condition depends on the severity. Mild or moderate dehydration can often be treated at home. Treatment should be started right away. Do not wait until dehydration becomes severe. Severe dehydration is an emergency and it needs to be treated in a hospital. Treatment for mild dehydration may include:  Drinking more fluids.  Replacing salts and minerals in your blood (electrolytes). Treatment for moderate dehydration may include:  Drinking an oral rehydration solution (ORS). This is a drink that helps you replace fluids and electrolytes (rehydrate). It is found at pharmacies and retail stores. Treatment for severe dehydration may  include:  Receiving fluids through an IV tube.  Receiving an electrolyte solution through a tube passed through your nose and into your stomach (nasogastric tube or NG tube).  Correcting abnormalities in electrolytes.  Treating the underlying cause of dehydration. Follow these instructions at home:  If directed by your health care provider, drink an ORS: ? Make an ORS by following instructions on the package. ? Start by drinking small amounts, about  cup (120 mL) every 5-10 minutes. ? Slowly increase how much you drink until you have taken the amount recommended by your health care provider.  Drink enough clear fluid to keep your urine clear or pale yellow. If you were told to drink an ORS, finish the ORS first, then start slowly drinking other clear fluids. Drink fluids such as: ? Water. Do not drink only water. Doing that can lead to having too little salt (sodium) in the body (hyponatremia). ? Ice chips. ? Fruit juice that you have added water to (diluted fruit juice). ? Low-calorie sports drinks.  Avoid: ? Alcohol. ? Drinks that contain a lot of sugar. These include high-calorie sports drinks, fruit juice that is not diluted, and soda. ? Caffeine. ? Foods that are greasy or contain a lot of fat or sugar.  Take over-the-counter and prescription medicines only as told by your health care provider.  Do not take sodium tablets. This can lead to having too much sodium in the body (hypernatremia).  Eat foods that contain a healthy balance of electrolytes, such as bananas, oranges, potatoes, tomatoes, and spinach.  Keep all follow-up visits as told by your health care provider. This is important. Contact a health care provider if:  You have abdominal pain that: ? Gets worse. ? Stays in one area (localizes).  You have a rash.  You have a stiff neck.  You are sleepier, more irritable, or more difficult to wake up than usual.  You feel weak, dizzy, or very thirsty. Get help  right away if:  You have: ? Symptoms of severe dehydration. ? A fever. ? A severe headache. ? Vomiting or diarrhea that gets worse or does not go away. ? Diarrhea for more than 24 hours. ? Blood or green matter (bile) in your vomit. ? Blood in your stool. This may cause stool to look black and tarry. ? Trouble breathing.  You cannot drink fluids without vomiting.  Your symptoms get worse with treatment.  You have not urinated in 6-8 hours.  You have urinated only a small amount of very dark urine over 6-8 hours.  You faint.  Your heart rate while sitting still is over 100 beats a minute. This information is not intended to replace advice given to you by your health care provider. Make sure you discuss any questions you have with your health care  provider. Document Released: 06/23/2003 Document Revised: 10/21/2015 Document Reviewed: 05/27/2015 Elsevier Interactive Patient Education  2018 [employer].

## 2017-05-04 NOTE — Progress Notes (Signed)
D/C instructions reviewed w/ pt's niece and caregiver. All questions answered. No further questions. Pt d/c in w/c to niece's car by NT. Pt in stable condition at d/c. Niece in possession of d/c packet and all personal belongings.

## 2017-05-04 NOTE — Consult Note (Signed)
Consultation Note Date: 05/04/2017   Patient Name: Stacey Fernandez  DOB: 14-Feb-1930  MRN: [id number]  Age / Sex: 84 y.o., female  PCP: Gildardo Cranker, DO Referring Physician: Elodia Florence., *  Reason for Consultation: Establishing goals of care  HPI/Patient Profile: 84 y.o. female  with past medical history of end-stag dementia, severe protein-calorie malnutrition, dCHF, CKD stage 3, HTN, HLD, and functional constipation. She lives at home with full time caregivers. She was admitted on 04/29/2017 with dehydration, change in mental status, and UTI. Family reports that she mentation has improved and closer to baseline today.  Palliative consulted for goals of care.   Clinical Assessment and Goals of Care: Ms. Peachey is unable to participate in a goals of care conversation due to dementia and fatigue.   I met with her sister (court appointed guardian) and her niece at her bedside.  Her brother also is very involved in her care and family makes decisions collectively.  Her brother was not present at time of the encounter, however, patient's sister reports that she will relay conversation to him.  In reviewing the chart, it appears that patient's sister is a court appointed guardian, while her brother is responsible for her finances.   Our team has met with Ms. Alpine and her family during prior hospitalizations.  The most recent time we had met was during hospitalization in February 2018.  Her family is well versed in dementia and realistic about its expected trajectory. We talked about her current hospitalization due to UTI, altered mental status, and dehydration (with prior episodes in the past).   Also discussed that the goal of the hospital is to allow her sister to have good time outside of the hospital.  At this point, family feels that the hospital is encouraging quality of life and that at her baseline she is still able to participate in many  activities that bring her joy.  Discussed that there is going to come a time in the future where, if her goal is to be at home, she may be better served to plan on being at home and bringing care to her at home rather repeated trips to the hospital. We discussed hospice as a tool that may be beneficial in this goal as she reaches a point where we are trying to fix problems that are not fixable.  Family is in agreement that they desire to take her home with home health as they have previously been arranging. If she is unable to regain function and he continues to decline, I recommended that she speak with Ms. Degen's PCP further to determine if she may be better served by focusing his care on staying at home with support of organization such as hospice.  Primary Decision Maker LEGAL GUARDIAN, sister [name].   SUMMARY OF RECOMMENDATIONS    DNR, otherwise full scope interventions  Once medically ready, plan for d/c home  Hard Choices book was given to family during last encounter with palliative in Feb 2018.  Another copy was given to family this [place].   Code Status/Advance Care Planning:  DNR  Palliative Prophylaxis:   Aspiration, Bowel Regimen, Frequent Pain Assessment, Oral Care and Turn Reposition  Psycho-social/Spiritual:   Desire for further Chaplaincy support:no  Prognosis:   Unable to determine  Discharge Planning: Home with Home Health     Primary Diagnoses: Present on Admission: . UTI (urinary tract infection)   I have reviewed the medical record, interviewed the patient and family,  and examined the patient. The following aspects are pertinent.  Past Medical History:  Diagnosis Date  . Anxiety state, unspecified   . Arthritis    "hands, feet" (09/13/2015)  . Chronic airway obstruction, not elsewhere classified   . Chronic kidney disease (CKD), stage II (mild)    [name] 09/13/2015  . COPD (chronic obstructive pulmonary disease) ([place])   . Dementia   .  Functional constipation    [name] 09/13/2015  . Hemiplegia affecting dominant side, late effect of cerebrovascular disease   . Hyperlipidemia   . Lung cancer ([place])   . Lung mass   . Memory loss   . Mixed Alzheimer's and vascular dementia    /notes 09/13/2015  . Other malaise and fatigue   . Protein calorie malnutrition ([place])    [name] 09/13/2015  . Sacral decubitus ulcer    chronic; stage IV/notes 09/13/2015  . Senile dementia with delirium   . Urinary frequency    Social History   Socioeconomic History  . Marital status: Divorced    Spouse name: None  . Number of children: None  . Years of education: None  . Highest education level: None  Social Needs  . Financial resource strain: None  . Food insecurity - worry: None  . Food insecurity - inability: None  . Transportation needs - medical: None  . Transportation needs - non-medical: None  Occupational History  . None  Tobacco Use  . Smoking status: Former Smoker    Packs/day: 0.50    Years: 20.00    Pack years: 10.00    Types: Cigarettes    Last attempt to quit: 06/09/2010    Years since quitting: 6.9  . Smokeless tobacco: Never Used  Substance and Sexual Activity  . Alcohol use: No    Alcohol/week: 0.0 oz  . Drug use: No  . Sexual activity: No  Other Topics Concern  . None  Social History Narrative  . None   Family History  Problem Relation Age of Onset  . Heart disease Mother   . Cancer Father   . Heart disease Sister   . Heart disease Brother    Scheduled Meds: . dorzolamide  2 drop Right Eye BID  . enoxaparin (LOVENOX) injection  30 mg Subcutaneous Q24H  . latanoprost  1 drop Both Eyes QHS  . mouth rinse  15 mL Mouth Rinse BID   Continuous Infusions: . cefTRIAXone (ROCEPHIN)  IV Stopped (05/03/17 1947)   PRN Meds:.acetaminophen **OR** acetaminophen, sodium chloride No Known Allergies   Review of Systems: Unable to obtain given baseline dementia  Physical Exam  Constitutional: Vital signs are  normal. She has a sickly appearance.  HENT:  Head: Normocephalic and atraumatic.  Mouth/Throat: Mucous membranes are dry. No oropharyngeal exudate.  Eyes: EOM are normal.  Neck: Normal range of motion.  Cardiovascular: Normal rate.  Pulmonary/Chest: Effort normal.  Abdominal: Soft.  Musculoskeletal: She exhibits no edema.  Neurological:  Sitting in bedside chair. Does not speak but will take food from niece.  Skin: Skin is warm and dry. There is pallor.    Vital Signs: BP (!) 144/90 (BP Location: Right Arm)   Pulse 69   Temp 97.8 F (36.6 C) (Axillary)   Resp 20   Ht '5\' 2"'  (1.575 m)   Wt 44.3 kg (97 lb 10.6 oz)   SpO2 100%   BMI 17.86 kg/m  Pain Assessment: Faces     SpO2: SpO2: 100 % O2 Device:SpO2: 100 % O2 Flow Rate: .  IO: Intake/output summary:   Intake/Output Summary (Last 24 hours) at 05/04/2017 0906 Last data filed at 05/04/2017 0419 Gross per 24 hour  Intake 461.67 ml  Output 200 ml  Net 261.67 ml    LBM: Last BM Date: 05/03/17 Baseline Weight: Weight: 39.6 kg (87 lb 6.4 oz) Most recent weight: Weight: 44.3 kg (97 lb 10.6 oz)     Palliative Assessment/Data: PPS 50-60%   Flowsheet Rows     Most Recent Value  Intake Tab  Referral Department  Hospitalist  Unit at Time of Referral  Med/Surg Unit  Palliative Care Primary Diagnosis  Neurology  Date Notified  05/02/17  Palliative Care Type  Return patient Palliative Care  Reason for referral  Clarify Goals of Care  Date of Admission  04/29/17  Date first seen by Palliative Care  05/03/17  # of days Palliative referral response time  1 Day(s)  # of days IP prior to Palliative referral  3  Clinical Assessment  Palliative Performance Scale Score  50%  Pain Max last 24 hours  Not able to report  Pain Min Last 24 hours  Not able to report  Psychosocial & Spiritual Assessment  Palliative Care Outcomes  Patient/Family meeting held?  Yes  Who was at the meeting?  Sister, niece      Time Total: 70  minutes Greater than 50%  of this time was spent counseling and coordinating care related to the above assessment and plan.  Signed by: Micheline Rough, MD [place] Team [phone]

## 2017-05-06 ENCOUNTER — Telehealth: Payer: Self-pay

## 2017-05-06 NOTE — Telephone Encounter (Signed)
I have made the 1st attempt to contact the patient or family member in charge, in order to follow up from recently being discharged from the hospital. I left a message on voicemail but I will make another attempt at a different time.  

## 2017-05-06 NOTE — Telephone Encounter (Signed)
I have made the 2nd attempt to contact the patient or family member in charge, in order to follow up from recently being discharged from the hospital. I left a message on voicemail but I will make another attempt at a different time.  

## 2017-05-30 ENCOUNTER — Other Ambulatory Visit: Payer: Self-pay | Admitting: Internal Medicine

## 2017-05-30 ENCOUNTER — Other Ambulatory Visit: Payer: Self-pay | Admitting: Nurse Practitioner

## 2017-06-05 ENCOUNTER — Ambulatory Visit (INDEPENDENT_AMBULATORY_CARE_PROVIDER_SITE_OTHER): Payer: Medicare Other | Admitting: Internal Medicine

## 2017-06-05 ENCOUNTER — Encounter: Payer: Self-pay | Admitting: Internal Medicine

## 2017-06-05 VITALS — BP 150/90 | HR 72 | Temp 96.4°F | Ht 60.0 in | Wt 94.0 lb

## 2017-06-05 DIAGNOSIS — I5032 Chronic diastolic (congestive) heart failure: Secondary | ICD-10-CM | POA: Diagnosis not present

## 2017-06-05 DIAGNOSIS — E782 Mixed hyperlipidemia: Secondary | ICD-10-CM

## 2017-06-05 DIAGNOSIS — F0150 Vascular dementia without behavioral disturbance: Secondary | ICD-10-CM

## 2017-06-05 DIAGNOSIS — G309 Alzheimer's disease, unspecified: Secondary | ICD-10-CM | POA: Diagnosis not present

## 2017-06-05 DIAGNOSIS — I10 Essential (primary) hypertension: Secondary | ICD-10-CM | POA: Diagnosis not present

## 2017-06-05 DIAGNOSIS — F0280 Dementia in other diseases classified elsewhere without behavioral disturbance: Secondary | ICD-10-CM | POA: Diagnosis not present

## 2017-06-05 NOTE — Patient Instructions (Addendum)
Continue current medications as ordered  Encourage oral intake of fluids  Follow up in 3 mos for dementia, FTT, HTN

## 2017-06-05 NOTE — Progress Notes (Signed)
Patient ID: Stacey Fernandez, female   DOB: 1929/06/23, 84 y.o.   MRN: [id number]   Location:  [facility] OFFICE  Provider: DR Elmon Kirschner  Code Status:  Goals of Care:  Advanced Directives 04/29/2017  Does Patient Have a Medical Advance Directive? Yes  Type of Estate agent of [place];Living will  Does patient want to make changes to medical advance directive? No - Patient declined  Copy of Healthcare Power of Attorney in Chart? No - copy requested  Would patient like information on creating a medical advance directive? No - Patient declined     Chief Complaint  Patient presents with  . Medical Management of Chronic Issues    3 month follow-up on HTN and Dementia. Here with Caregiver Pollyann Kennedy (personal care)  . Medication Refill    No refills needed     HPI: Patient is a 84 y.o. female seen today for medical management of chronic diseases. She was hospitalized last month for dehydration and UTI. She was tx with IVF and abx. Caregiver states she pushes fluid/water intake and pt eats food when she is fed. No falls. She is a poor historian due to dementia. Hx obtained from chart. K 3.4; Cr 0.9; Na 145 at d/c  Dementia/anxiety/FTT/protein calorie malnutrition - takes namenda xr and remeron. No behavior disturbances. Weight 94 lbs. She refuses ensure/boost supplements. Albumin 2.5. Na 145.  Hyperlipidemia - stable on lipitor. LDL 85  COPD/hx lung cancer- released from oncology in 2017 as she has not had recurrence of tumor.   HTN/edema/dHF - BP diet controlled lasix stopped in hospital due to dehydration. Also takes ASA daily  Constipation - unchanged. BM q3days  Glaucoma - unchanged. uses eye gtts. followed by eye specialist   Past Medical History:  Diagnosis Date  . Anxiety state, unspecified   . Arthritis    "hands, feet" (09/13/2015)  . Chronic airway obstruction, not elsewhere classified   . Chronic kidney disease (CKD), stage II (mild)    Hattie Perch  09/13/2015  . COPD (chronic obstructive pulmonary disease) (HCC)   . Dementia   . Functional constipation    Hattie Perch 09/13/2015  . Hemiplegia affecting dominant side, late effect of cerebrovascular disease   . Hyperlipidemia   . Lung cancer (HCC)   . Lung mass   . Memory loss   . Mixed Alzheimer's and vascular dementia    /notes 09/13/2015  . Other malaise and fatigue   . Protein calorie malnutrition (HCC)    Hattie Perch 09/13/2015  . Sacral decubitus ulcer    chronic; stage IV/notes 09/13/2015  . Senile dementia with delirium   . Urinary frequency     Past Surgical History:  Procedure Laterality Date  . CATARACT EXTRACTION, BILATERAL    . mediastinal lymp node dissection  01/10/2011   VAN TRIGT  . WEDGE RESECTION RUL NODULE  01/10/2011   VAN TRIGT     reports that she quit smoking about 6 years ago. Her smoking use included cigarettes. She has a 10.00 pack-year smoking history. she has never used smokeless tobacco. She reports that she does not drink alcohol or use drugs. Social History   Socioeconomic History  . Marital status: Divorced    Spouse name: Not on file  . Number of children: Not on file  . Years of education: Not on file  . Highest education level: Not on file  Social Needs  . Financial resource strain: Not on file  . Food insecurity - worry: Not on file  .  Food insecurity - inability: Not on file  . Transportation needs - medical: Not on file  . Transportation needs - non-medical: Not on file  Occupational History  . Not on file  Tobacco Use  . Smoking status: Former Smoker    Packs/day: 0.50    Years: 20.00    Pack years: 10.00    Types: Cigarettes    Last attempt to quit: 06/09/2010    Years since quitting: 6.9  . Smokeless tobacco: Never Used  Substance and Sexual Activity  . Alcohol use: No    Alcohol/week: 0.0 oz  . Drug use: No  . Sexual activity: No  Other Topics Concern  . Not on file  Social History Narrative  . Not on file    Family  History  Problem Relation Age of Onset  . Heart disease Mother   . Cancer Father   . Heart disease Sister   . Heart disease Brother     No Known Allergies  Outpatient Encounter Medications as of 06/05/2017  Medication Sig  . alendronate (FOSAMAX) 70 MG tablet TAKE 1 TABLET WEEKLY WITH A FULL GLASS OF WATER ON AN EMPTY STOMACH  . aspirin EC 81 MG tablet Take 81 mg by mouth at bedtime.  Marland Kitchen atorvastatin (LIPITOR) 10 MG tablet TAKE 1 TABLET BY MOUTH EVERY DAY  . Calcium Carbonate-Vitamin D (CALTRATE 600+D) 600-400 MG-UNIT tablet Take 1 tablet by mouth 2 (two) times daily.  Marland Kitchen latanoprost (XALATAN) 0.005 % ophthalmic solution PLACE 1 DROP INTO BOTH EYES AT BEDTIME.  . memantine (NAMENDA XR) 28 MG CP24 24 hr capsule Take 1 capsule (28 mg total) by mouth daily.  . mirtazapine (REMERON) 15 MG tablet TAKE 1 TABLET (15 MG TOTAL) BY MOUTH AT BEDTIME.  . sodium chloride (OCEAN) 0.65 % SOLN nasal spray Place 1 spray into both nostrils as needed for congestion.  . TRUSOPT 2 % ophthalmic solution Place 2 drops into the right eye 2 (two) times daily.    No facility-administered encounter medications on file as of 06/05/2017.     Review of Systems:  Review of Systems  Unable to perform ROS: Dementia    Health Maintenance  Topic Date Due  . TETANUS/TDAP  05/02/1950  . MAMMOGRAM  10/20/2015  . INFLUENZA VACCINE  Completed  . DEXA SCAN  Completed  . PNA vac Low Risk Adult  Completed    Physical Exam: Vitals:   06/05/17 1415  BP: (!) 150/90  Pulse: 72  Temp: (!) 96.4 F (35.8 C)  TempSrc: Axillary  Weight: 94 lb (42.6 kg)  Height: 5' (1.524 m)   Body mass index is 18.36 kg/m. Physical Exam  Constitutional: She appears well-developed.  Frail appearing in NAD  HENT:  Mouth/Throat: Oropharynx is clear and moist. No oropharyngeal exudate.  MMM; no oral thrush  Eyes: Pupils are equal, round, and reactive to light. No scleral icterus.  Neck: Neck supple. Carotid bruit is not present. No  tracheal deviation present. No thyromegaly present.  Cardiovascular: Normal rate, regular rhythm, normal heart sounds and intact distal pulses. Exam reveals no gallop and no friction rub.  No murmur heard. No LE edema b/l. no calf TTP.   Pulmonary/Chest: Effort normal and breath sounds normal. No stridor. No respiratory distress. She has no wheezes. She has no rales.  Abdominal: Soft. Normal appearance and bowel sounds are normal. She exhibits no distension and no mass. There is no hepatomegaly. There is no tenderness. There is no rigidity, no rebound and no guarding.  No hernia.  Musculoskeletal: She exhibits edema (small and large joints). She exhibits no tenderness.  B/l UE joint stiffness  Lymphadenopathy:    She has no cervical adenopathy.  Neurological: She is alert.  Skin: Skin is warm and dry. No rash noted.  Psychiatric: She has a normal mood and affect. Her behavior is normal.    Labs reviewed: Basic Metabolic Panel: Recent Labs    10/05/16 1421  04/30/17 0700 04/30/17 1525  05/02/17 0539 05/03/17 0521 05/04/17 0644  NA 143   < >  --  149*   < > 141 140 136  K 4.4   < >  --  3.6   < > 3.3* 3.6 3.7  CL 110   < >  --  121*   < > 114* 112* 108  CO2 24   < >  --  23   < > 21* 22 22  GLUCOSE 81   < >  --  120*   < > 82 91 80  BUN 19   < >  --  22*   < > 17 16 13   CREATININE 0.92*   < >  --  1.01*   < > 0.88 0.88 0.87  CALCIUM 8.6   < >  --  7.7*   < > 7.4* 7.6* 7.5*  MG  --   --  2.0  --   --  1.8 1.8  --   PHOS  --   --   --  1.9*  --   --   --   --   TSH 0.92  --   --   --   --   --   --   --    < > = values in this interval not displayed.   Liver Function Tests: Recent Labs    10/05/16 1421 04/30/17 0551 04/30/17 1525 05/01/17 0530  AST 35 25  --   --   ALT 36* 19  --   --   ALKPHOS 89 72  --   --   BILITOT 0.4 0.3  --   --   PROT 6.5 6.6  --   --   ALBUMIN 3.6 3.0* 2.7* 2.5*   No results for input(s): LIPASE, AMYLASE in the last 8760 hours. No results for  input(s): AMMONIA in the last 8760 hours. CBC: Recent Labs    06/13/16  09/04/16 1539 04/29/17 1428 04/30/17 0551 05/02/17 0539 05/04/17 0644  WBC 6.9   < > 6.2 10.3 9.9 6.7 4.3  NEUTROABS 3  --  3,534 8.1*  --   --   --   HGB 11.1*  --  14.2 14.4 13.7 11.5* 12.6  HCT 35*  --  44.1 46.4* 42.5 36.5 38.6  MCV  --    < > 93.0 95.7 94.4 95.1 91.7  PLT 260  --  171 105* 106* 97* 105*   < > = values in this interval not displayed.   Lipid Panel: Recent Labs    10/05/16 1421  CHOL 149  HDL 49*  LDLCALC 85  TRIG 75  CHOLHDL 3.0   No results found for: HGBA1C  Procedures since last visit: No results found.  Assessment/Plan   ICD-10-CM   1. Mixed Alzheimer's and vascular dementia G30.9    F01.50    F02.80   2. Essential hypertension I10   3. Chronic diastolic heart failure, NYHA class 1 (HCC) I50.32   4. Mixed hyperlipidemia E78.2  Continue current medications as ordered  Encourage oral intake of fluids  Follow up in 3 mos for dementia, FTT, HTN   Jamyah Folk S. Ancil Linsey  [facility] [address] [place], [place] [address] [phone] Cell (Monday-Friday 8 AM - 5 PM) [phone] After 5 PM and follow prompts

## 2017-06-26 ENCOUNTER — Telehealth: Payer: Self-pay | Admitting: *Deleted

## 2017-06-26 DIAGNOSIS — I5032 Chronic diastolic (congestive) heart failure: Secondary | ICD-10-CM

## 2017-06-26 DIAGNOSIS — I10 Essential (primary) hypertension: Secondary | ICD-10-CM

## 2017-06-26 DIAGNOSIS — G300 Alzheimer's disease with early onset: Principal | ICD-10-CM

## 2017-06-26 DIAGNOSIS — F0280 Dementia in other diseases classified elsewhere without behavioral disturbance: Secondary | ICD-10-CM

## 2017-06-26 NOTE — Telephone Encounter (Signed)
Pt actually is a good candidate for palliative care eval instead of [place]

## 2017-06-26 NOTE — Telephone Encounter (Signed)
Patient sister, Lorayne Bender walked into office and stated that she called [facility] and inquired about different services that was available to patient due to patient getting progressively worse. They suggested Home Health order to be placed to evaluate and treat. Family is wanting this. Please Advise.

## 2017-06-27 NOTE — Telephone Encounter (Signed)
Patient sister,Mary notified and agreed. Referral placed.

## 2017-06-27 NOTE — Telephone Encounter (Signed)
LMOM to return call.

## 2017-07-23 ENCOUNTER — Telehealth: Payer: Self-pay | Admitting: *Deleted

## 2017-07-23 NOTE — Telephone Encounter (Signed)
We cannot order DME without approval of her son

## 2017-07-23 NOTE — Telephone Encounter (Signed)
Stacey Fernandez, caregiver called and requested a Harrel Lemon Lift order to be faxed to [facility]. And caregiver wanted me to call her back to inform her of Dr. Vale Haven response. Please Advise.   I explained to her that we did not have anything on file to discuss patient information with her and she stated that she will get the POA, patient's son Stacey Fernandez, to call us to give verbal approval.

## 2017-07-24 ENCOUNTER — Telehealth: Payer: Self-pay

## 2017-07-24 MED ORDER — CETIRIZINE HCL 5 MG PO TABS: 5.0000 mg | ORAL_TABLET | Freq: Every day | ORAL | 3 refills | Status: AC

## 2017-07-24 NOTE — Telephone Encounter (Signed)
Zyrtec 5mg  #30 take 1 tab po daily with 3RF for seasonal allergy

## 2017-07-24 NOTE — Telephone Encounter (Signed)
Spoke with Lewis Shock, patient's caregiver, rx sent to pharmacy as requested

## 2017-07-24 NOTE — Telephone Encounter (Signed)
Left message on voicemail for Stacey Fernandez to return call to give verbal for DME request initiated by caregiver. I noted in message that we do have Stacey Fernandez listed as a contact person yet a DPR (designated party release) needs to be sign to allow Stacey Fernandez to call and make request on patient's behalf.  Return call requested   Side Note: Lewis Shock was made aware of Dr.Carter's response this morning when she called to inquire about allergy medication (see phone note dated 07/24/17)

## 2017-07-24 NOTE — Telephone Encounter (Signed)
Patient's caregiver called requesting rx for allergy medication for treatment of seasonal allergies to be sent to [facility].  Patient sneezing, runny nose and eye irritation (rubbing them a lot). I informed caregiver patient may need appointment and Lewis Shock states this is a yearly seasonal concern. I reviewed problem list, patient has diagnosis of allergic rhinitis   Please advise

## 2017-07-25 NOTE — Telephone Encounter (Signed)
POA, Ferd Hibbs [phone] is going to come in and sign a DPR to release information to Caregiver.

## 2017-08-03 DIAGNOSIS — M6281 Muscle weakness (generalized): Secondary | ICD-10-CM | POA: Diagnosis not present

## 2017-08-03 DIAGNOSIS — I10 Essential (primary) hypertension: Secondary | ICD-10-CM | POA: Diagnosis not present

## 2017-08-03 DIAGNOSIS — F0280 Dementia in other diseases classified elsewhere without behavioral disturbance: Secondary | ICD-10-CM | POA: Diagnosis not present

## 2017-08-03 DIAGNOSIS — R262 Difficulty in walking, not elsewhere classified: Secondary | ICD-10-CM | POA: Diagnosis not present

## 2017-08-03 DIAGNOSIS — G309 Alzheimer's disease, unspecified: Secondary | ICD-10-CM | POA: Diagnosis not present

## 2017-08-08 ENCOUNTER — Emergency Department (HOSPITAL_COMMUNITY): Payer: Medicare Other

## 2017-08-08 ENCOUNTER — Inpatient Hospital Stay (HOSPITAL_COMMUNITY)
Admission: EM | Admit: 2017-08-08 | Discharge: 2017-08-14 | DRG: 871 | Disposition: E | Payer: Medicare Other | Attending: Internal Medicine | Admitting: Internal Medicine

## 2017-08-08 ENCOUNTER — Encounter (HOSPITAL_COMMUNITY): Payer: Self-pay | Admitting: Emergency Medicine

## 2017-08-08 DIAGNOSIS — N182 Chronic kidney disease, stage 2 (mild): Secondary | ICD-10-CM | POA: Diagnosis not present

## 2017-08-08 DIAGNOSIS — Z8249 Family history of ischemic heart disease and other diseases of the circulatory system: Secondary | ICD-10-CM | POA: Diagnosis not present

## 2017-08-08 DIAGNOSIS — E782 Mixed hyperlipidemia: Secondary | ICD-10-CM | POA: Diagnosis present

## 2017-08-08 DIAGNOSIS — F0150 Vascular dementia without behavioral disturbance: Secondary | ICD-10-CM | POA: Diagnosis present

## 2017-08-08 DIAGNOSIS — D696 Thrombocytopenia, unspecified: Secondary | ICD-10-CM | POA: Diagnosis present

## 2017-08-08 DIAGNOSIS — G9341 Metabolic encephalopathy: Secondary | ICD-10-CM | POA: Diagnosis present

## 2017-08-08 DIAGNOSIS — F411 Generalized anxiety disorder: Secondary | ICD-10-CM | POA: Diagnosis present

## 2017-08-08 DIAGNOSIS — E860 Dehydration: Secondary | ICD-10-CM | POA: Diagnosis not present

## 2017-08-08 DIAGNOSIS — Z87891 Personal history of nicotine dependence: Secondary | ICD-10-CM

## 2017-08-08 DIAGNOSIS — E43 Unspecified severe protein-calorie malnutrition: Secondary | ICD-10-CM | POA: Diagnosis present

## 2017-08-08 DIAGNOSIS — J9601 Acute respiratory failure with hypoxia: Secondary | ICD-10-CM | POA: Diagnosis present

## 2017-08-08 DIAGNOSIS — N183 Chronic kidney disease, stage 3 unspecified: Secondary | ICD-10-CM | POA: Diagnosis present

## 2017-08-08 DIAGNOSIS — E785 Hyperlipidemia, unspecified: Secondary | ICD-10-CM

## 2017-08-08 DIAGNOSIS — G9340 Encephalopathy, unspecified: Secondary | ICD-10-CM

## 2017-08-08 DIAGNOSIS — F0280 Dementia in other diseases classified elsewhere without behavioral disturbance: Secondary | ICD-10-CM | POA: Diagnosis present

## 2017-08-08 DIAGNOSIS — R6521 Severe sepsis with septic shock: Secondary | ICD-10-CM | POA: Diagnosis present

## 2017-08-08 DIAGNOSIS — J181 Lobar pneumonia, unspecified organism: Secondary | ICD-10-CM | POA: Diagnosis present

## 2017-08-08 DIAGNOSIS — J189 Pneumonia, unspecified organism: Secondary | ICD-10-CM

## 2017-08-08 DIAGNOSIS — J440 Chronic obstructive pulmonary disease with acute lower respiratory infection: Secondary | ICD-10-CM | POA: Diagnosis present

## 2017-08-08 DIAGNOSIS — Z681 Body mass index (BMI) 19 or less, adult: Secondary | ICD-10-CM

## 2017-08-08 DIAGNOSIS — G309 Alzheimer's disease, unspecified: Secondary | ICD-10-CM

## 2017-08-08 DIAGNOSIS — A419 Sepsis, unspecified organism: Principal | ICD-10-CM | POA: Diagnosis present

## 2017-08-08 DIAGNOSIS — R579 Shock, unspecified: Secondary | ICD-10-CM | POA: Diagnosis not present

## 2017-08-08 DIAGNOSIS — Z66 Do not resuscitate: Secondary | ICD-10-CM | POA: Diagnosis present

## 2017-08-08 DIAGNOSIS — L89152 Pressure ulcer of sacral region, stage 2: Secondary | ICD-10-CM | POA: Diagnosis present

## 2017-08-08 DIAGNOSIS — I69951 Hemiplegia and hemiparesis following unspecified cerebrovascular disease affecting right dominant side: Secondary | ICD-10-CM

## 2017-08-08 DIAGNOSIS — I5032 Chronic diastolic (congestive) heart failure: Secondary | ICD-10-CM | POA: Diagnosis present

## 2017-08-08 DIAGNOSIS — I10 Essential (primary) hypertension: Secondary | ICD-10-CM | POA: Diagnosis not present

## 2017-08-08 DIAGNOSIS — I4891 Unspecified atrial fibrillation: Secondary | ICD-10-CM | POA: Diagnosis present

## 2017-08-08 DIAGNOSIS — J449 Chronic obstructive pulmonary disease, unspecified: Secondary | ICD-10-CM | POA: Diagnosis not present

## 2017-08-08 DIAGNOSIS — J9690 Respiratory failure, unspecified, unspecified whether with hypoxia or hypercapnia: Secondary | ICD-10-CM | POA: Diagnosis present

## 2017-08-08 DIAGNOSIS — I130 Hypertensive heart and chronic kidney disease with heart failure and stage 1 through stage 4 chronic kidney disease, or unspecified chronic kidney disease: Secondary | ICD-10-CM | POA: Diagnosis present

## 2017-08-08 DIAGNOSIS — R4182 Altered mental status, unspecified: Secondary | ICD-10-CM | POA: Diagnosis present

## 2017-08-08 DIAGNOSIS — Z515 Encounter for palliative care: Secondary | ICD-10-CM | POA: Diagnosis present

## 2017-08-08 DIAGNOSIS — Z85118 Personal history of other malignant neoplasm of bronchus and lung: Secondary | ICD-10-CM

## 2017-08-08 LAB — I-STAT CG4 LACTIC ACID, ED
Lactic Acid, Venous: 4.27 mmol/L (ref 0.5–1.9)
Lactic Acid, Venous: 0.39 mmol/L — ABNORMAL LOW (ref 0.5–1.9)

## 2017-08-08 LAB — CBC WITH DIFFERENTIAL/PLATELET
BASOS PCT: 0 %
Basophils Absolute: 0 10*3/uL (ref 0.0–0.1)
Eosinophils Absolute: 0 10*3/uL (ref 0.0–0.7)
Eosinophils Relative: 0 %
HEMATOCRIT: 42.8 % (ref 36.0–46.0)
HEMOGLOBIN: 13.5 g/dL (ref 12.0–15.0)
LYMPHS ABS: 1 10*3/uL (ref 0.7–4.0)
Lymphocytes Relative: 10 %
MCH: 30.8 pg (ref 26.0–34.0)
MCHC: 31.5 g/dL (ref 30.0–36.0)
MCV: 97.5 fL (ref 78.0–100.0)
Monocytes Absolute: 0.8 10*3/uL (ref 0.1–1.0)
Monocytes Relative: 8 %
NEUTROS ABS: 8.4 10*3/uL — AB (ref 1.7–7.7)
NRBC: 2 /100{WBCs} — AB
Neutrophils Relative %: 82 %
Platelets: 90 10*3/uL — ABNORMAL LOW (ref 150–400)
RBC: 4.39 MIL/uL (ref 3.87–5.11)
RDW: 19.1 % — ABNORMAL HIGH (ref 11.5–15.5)
WBC: 10.2 10*3/uL (ref 4.0–10.5)

## 2017-08-08 LAB — BLOOD GAS, ARTERIAL
Acid-base deficit: 4.2 mmol/L — ABNORMAL HIGH (ref 0.0–2.0)
Bicarbonate: 16.7 mmol/L — ABNORMAL LOW (ref 20.0–28.0)
Drawn by: 441261
O2 CONTENT: 15 L/min
O2 SAT: 99.3 %
PH ART: 7.52 — AB (ref 7.350–7.450)
PO2 ART: 363 mmHg — AB (ref 83.0–108.0)
Patient temperature: 98.6
pCO2 arterial: 20.5 mmHg — ABNORMAL LOW (ref 32.0–48.0)

## 2017-08-08 MED ORDER — MORPHINE SULFATE (PF) 2 MG/ML IV SOLN
1.0000 mg | INTRAVENOUS | Status: DC | PRN
  Administered 2017-08-08: 1 mg via INTRAVENOUS
  Administered 2017-08-08: 2 mg via INTRAVENOUS
  Filled 2017-08-08 (×2): qty 1

## 2017-08-08 MED ORDER — MORPHINE SULFATE (CONCENTRATE) 10 MG/0.5ML PO SOLN: 5.0000 mg | ORAL | Status: DC | PRN

## 2017-08-08 MED ORDER — NAPHAZOLINE-GLYCERIN 0.012-0.2 % OP SOLN
1.0000 [drp] | Freq: Four times a day (QID) | OPHTHALMIC | Status: DC | PRN
  Filled 2017-08-08: qty 15

## 2017-08-08 MED ORDER — ETOMIDATE 2 MG/ML IV SOLN: 10.0000 mg | Freq: Once | INTRAVENOUS | Status: DC

## 2017-08-08 MED ORDER — SODIUM CHLORIDE 0.9 % IV BOLUS
1000.0000 mL | Freq: Once | INTRAVENOUS | Status: AC
  Administered 2017-08-08: 1000 mL via INTRAVENOUS

## 2017-08-08 MED ORDER — SCOPOLAMINE 1 MG/3DAYS TD PT72SCOPOLAMINE 1 MG/3DAYS
1.0000 | MEDICATED_PATCH | TRANSDERMAL | Status: DC
Start: 2017-08-08 — End: 2017-08-09
  Administered 2017-08-08: 1.5 mg via TRANSDERMAL
  Filled 2017-08-08: qty 1

## 2017-08-08 NOTE — ED Notes (Signed)
Pt was stuck 2x by this nurse, 1x by Ethelene Browns, RN and 1x Stacy RN. Cannot get any blood back

## 2017-08-08 NOTE — ED Notes (Signed)
ED TO INPATIENT HANDOFF REPORT  Name/Age/Gender Stacey Fernandez 84 y.o. female  Code Status    Code Status Orders  (From admission, onward)        Start     Ordered   08/05/2017 1900  Do not attempt resuscitation (DNR)  Continuous     07/29/2017 1859    Code Status History    Date Active Date Inactive Code Status Order ID Comments User Context   04/29/2017 2056 05/04/2017 1537 DNR [id number]  Jani Gravel, MD Inpatient   05/28/2016 1340 06/01/2016 1758 DNR [id number]  Samella Parr, NP Inpatient   05/28/2016 1242 05/28/2016 1340 DNR [id number]  Samella Parr, NP ED   09/13/2015 1649 09/17/2015 1413 DNR [id number]  Samella Parr, NP ED   09/13/2015 1229 09/13/2015 1649 DNR [id number]  Orlie Dakin, MD ED      Home/SNF/Other Home  Chief Complaint not eating / weakness   Level of Care/Admitting Diagnosis ED Disposition    ED Disposition Condition [facility] Area: [facility] [[id number]]  Level of Care: Med-Surg [16]  Diagnosis: Comfort measures only status [1027253]  Admitting Physician: Ivor Costa [[id number]]  Attending Physician: Ivor Costa [id number]  Estimated length of stay: past midnight tomorrow  Certification:: I certify this patient will need inpatient services for at least 2 midnights  PT Class (Do Not Modify): Inpatient [101]  PT Acc Code (Do Not Modify): Private [1]       Medical History Past Medical History:  Diagnosis Date  . Anxiety state, unspecified   . Arthritis    "hands, feet" (09/13/2015)  . Chronic airway obstruction, not elsewhere classified   . Chronic kidney disease (CKD), stage II (mild)    [name] 09/13/2015  . COPD (chronic obstructive pulmonary disease) ([place])   . Dementia   . Functional constipation    [name] 09/13/2015  . Hemiplegia affecting dominant side, late effect of cerebrovascular disease   . Hyperlipidemia   . Lung cancer ([place])   . Lung mass   . Memory loss   . Mixed Alzheimer's and vascular dementia     /notes 09/13/2015  . Other malaise and fatigue   . Protein calorie malnutrition ([place])    [name] 09/13/2015  . Sacral decubitus ulcer    chronic; stage IV/notes 09/13/2015  . Senile dementia with delirium   . Urinary frequency     Allergies No Known Allergies  IV Location/Drains/Wounds Patient Lines/Drains/Airways Status   Active Line/Drains/Airways    Name:   Placement date:   Placement time:   Site:   Days:   Peripheral IV 07/21/2017 Right;Upper Arm   07/15/2017    1706    Arm   less than 1   Peripheral IV 07/30/2017 Right Wrist   07/23/2017    1600    Wrist   less than 1   External Urinary Catheter   04/29/17    2055    -   101   Wound / Incision (Open or Dehisced) 04/30/17 Non-pressure wound Buttocks Right Partial thickness wound over previously healed pressure injury    04/30/17    2043    Buttocks   100          Labs/Imaging Results for orders placed or performed during the hospital encounter of 07/22/2017 (from the past 48 hour(s))  CBC with Differential     Status: Abnormal   Collection Time: 08/05/2017  4:04 PM  Result Value Ref Range   WBC  10.2 4.0 - 10.5 K/uL   RBC 4.39 3.87 - 5.11 MIL/uL   Hemoglobin 13.5 12.0 - 15.0 g/dL   HCT 42.8 36.0 - 46.0 %   MCV 97.5 78.0 - 100.0 fL   MCH 30.8 26.0 - 34.0 pg   MCHC 31.5 30.0 - 36.0 g/dL   RDW 19.1 (H) 11.5 - 15.5 %   Platelets 90 (L) 150 - 400 K/uL   Neutrophils Relative % 82 %   Lymphocytes Relative 10 %   Monocytes Relative 8 %   Eosinophils Relative 0 %   Basophils Relative 0 %   nRBC 2 (H) 0 /100 WBC   Neutro Abs 8.4 (H) 1.7 - 7.7 K/uL   Lymphs Abs 1.0 0.7 - 4.0 K/uL   Monocytes Absolute 0.8 0.1 - 1.0 K/uL   Eosinophils Absolute 0.0 0.0 - 0.7 K/uL   Basophils Absolute 0.0 0.0 - 0.1 K/uL   RBC Morphology RARE NRBCs    Smear Review PLATELET COUNT CONFIRMED BY SMEAR     Comment: Performed at [facility], [place] [address] [place], [place] [address]  I-Stat CG4 Lactic Acid, ED     Status: Abnormal    Collection Time: 07/23/2017  4:29 PM  Result Value Ref Range   Lactic Acid, Venous 4.27 (HH) 0.5 - 1.9 mmol/L   Comment NOTIFIED PHYSICIAN   Blood gas, arterial     Status: Abnormal   Collection Time: 08/03/2017  4:59 PM  Result Value Ref Range   O2 Content 15.0 L/min   Delivery systems NON-REBREATHER OXYGEN MASK    pH, Arterial 7.520 (H) 7.350 - 7.450   pCO2 arterial 20.5 (L) 32.0 - 48.0 mmHg   pO2, Arterial 363 (H) 83.0 - 108.0 mmHg   Bicarbonate 16.7 (L) 20.0 - 28.0 mmol/L   Acid-base deficit 4.2 (H) 0.0 - 2.0 mmol/L   O2 Saturation 99.3 %   Patient temperature 98.6    Collection site RIGHT RADIAL    Drawn by [id number]    Sample type ARTERIAL DRAW    Allens test (pass/fail) PASS PASS    Comment: Performed at [facility], [place] [address] [place], [place] [address]  I-Stat CG4 Lactic Acid, ED     Status: Abnormal   Collection Time: 08/07/2017  5:57 PM  Result Value Ref Range   Lactic Acid, Venous 0.39 (L) 0.5 - 1.9 mmol/L   Dg Chest Portable 1 View  Result Date: 07/18/2017 CLINICAL DATA:  Dementia. Not eating or drinking over the last week. EXAM: PORTABLE CHEST 1 VIEW COMPARISON:  05/30/2016 FINDINGS: Chronic cardiomegaly and aortic atherosclerosis. Previous pulmonary resection in the right upper lung. Abnormal density in the left lower lobe consistent with left lower lobe pneumonia. Small amount of left pleural fluid. IMPRESSION: Left lower lobe pneumonia.  Small left effusion. Cardiomegaly and aortic atherosclerosis. Electronically Signed   By: Nelson Chimes M.D.   On: 08/07/2017 17:25    Pending Labs Unresulted Labs (From admission, onward)   None      Vitals/Pain Today's Vitals   08/01/2017 1632 08/02/2017 1700 08/01/2017 1730 08/10/2017 1815  BP: (!) 81/57 (!) 93/40 (!) 80/65 (!) 56/33  Pulse:      Resp: 19 (!) 0 13 16  Temp: (!) 94.3 F (34.6 C)     TempSrc: Rectal     SpO2: (!) 82%     PainSc:        Isolation Precautions No active  isolations  Medications Medications  morphine CONCENTRATE 10  MG/0.5ML oral solution 5 mg (has no administration in time range)  naphazoline-glycerin (CLEAR EYES REDNESS) ophth solution 1-2 drop (has no administration in time range)  scopolamine (TRANSDERM-SCOP) 1 MG/3DAYS 1.5 mg (1.5 mg Transdermal Patch Applied 07/26/2017 2009)  sodium chloride 0.9 % bolus 1,000 mL (0 mLs Intravenous Stopped 08/10/2017 1709)    Mobility non-ambulatory

## 2017-08-08 NOTE — Progress Notes (Signed)
Received this patient, Stacey Fernandez, to room 1333 for comfort measures only. Patient is non responsive with periods apenic breathing noted, NRB O2 remains in place. Family members and caregiver at bedside.

## 2017-08-08 NOTE — Progress Notes (Signed)
Pt with existing PIV running freely to gravity.  RN states needs a second PIV and bloodwork.  Unable to get blood from PIV,  able to obtain a 20 G in RUA.

## 2017-08-08 NOTE — H&P (Addendum)
History and Physical    Stacey Fernandez:[id number] DOB: 1929/10/09 DOA: 08/07/2017  Referring MD/NP/PA:   PCP: Gildardo Cranker, DO   Patient coming from:  The patient is coming from home.  At baseline, pt is dependent for most of ADL.      Chief Complaint: AMS, SOB, decreased oral intake  HPI: Stacey Fernandez is a 84 y.o. female with medical history significant of hypertension, hyperlipidemia, stroke, COPD, thrombocytopenia, CKD-III, dementia, lung cancer (s/p of surgery, no chemo and radiation therapy), decubitus ulcer, CKD-2, dCHF, anxiety, who presents with altered mental status, COPD, decreased oral intake.  Per his brother, patient is not able to communicate with family at baseline, but she has some response to family members when they talk to her.  In the past 7 days, patient becomes more confused and less responsive.  She has significantly decreased oral intake.  She seems to have respiratory distress and shortness of breath per her brother, but no active cough.  No nausea, vomiting, diarrhea notated.  Not sure if she has any symptoms of UTI.  Not sure if she has any pain. She has minimal movement in her extremities.  ED Course: pt was found to have WBC 10.3, lactic acid of 4.27, 0.39, pending BMP, temperature 94.3, tachycardia, respiratory rate 1-8, oxygen saturation 82% on room air, hypotensive with a blood pressure of SBP 60-70s.  Chest x-ray to the left lower lobe infiltration.  Patient is admitted to [facility] as an inpatient for comfort care only.  Review of Systems: Cannot be viewed due to his dementia and altered mental status.  Allergy: No Known Allergies  Past Medical History:  Diagnosis Date  . Anxiety state, unspecified   . Arthritis    "hands, feet" (09/13/2015)  . Chronic airway obstruction, not elsewhere classified   . Chronic kidney disease (CKD), stage II (mild)    [name] 09/13/2015  . COPD (chronic obstructive pulmonary disease) ([place])   . Dementia   .  Functional constipation    [name] 09/13/2015  . Hemiplegia affecting dominant side, late effect of cerebrovascular disease   . Hyperlipidemia   . Lung cancer ([place])   . Lung mass   . Memory loss   . Mixed Alzheimer's and vascular dementia    /notes 09/13/2015  . Other malaise and fatigue   . Protein calorie malnutrition ([place])    [name] 09/13/2015  . Sacral decubitus ulcer    chronic; stage IV/notes 09/13/2015  . Senile dementia with delirium   . Urinary frequency     Past Surgical History:  Procedure Laterality Date  . CATARACT EXTRACTION, BILATERAL    . mediastinal lymp node dissection  01/10/2011   VAN TRIGT  . WEDGE RESECTION RUL NODULE  01/10/2011   VAN TRIGT    Social History:  reports that she quit smoking about 7 years ago. Her smoking use included cigarettes. She has a 10.00 pack-year smoking history. She has never used smokeless tobacco. She reports that she does not drink alcohol or use drugs.  Family History:  Family History  Problem Relation Age of Onset  . Heart disease Mother   . Cancer Father   . Heart disease Sister   . Heart disease Brother      Prior to Admission medications   Medication Sig Start Date End Date Taking? Authorizing Provider  alendronate (FOSAMAX) 70 MG tablet TAKE 1 TABLET WEEKLY WITH A FULL GLASS OF WATER ON AN EMPTY STOMACH 05/30/17  Yes Gildardo Cranker, DO  aspirin EC 81 MG tablet Take 81 mg by mouth at bedtime.   Yes [provider]  atorvastatin (LIPITOR) 10 MG tablet TAKE 1 TABLET BY MOUTH EVERY DAY 05/30/17  Yes Gildardo Cranker, DO  Calcium Carbonate-Vitamin D (CALTRATE 600+D) 600-400 MG-UNIT tablet Take 1 tablet by mouth 2 (two) times daily.   Yes [provider]  latanoprost (XALATAN) 0.005 % ophthalmic solution PLACE 1 DROP INTO BOTH EYES AT BEDTIME. 02/15/14  Yes Reed, Tiffany L, DO  memantine (NAMENDA XR) 28 MG CP24 24 hr capsule Take 1 capsule (28 mg total) by mouth daily. 01/08/17  Yes Eulas Post, Monica, DO  mirtazapine  (REMERON) 15 MG tablet TAKE 1 TABLET (15 MG TOTAL) BY MOUTH AT BEDTIME. 02/21/17  Yes Gildardo Cranker, DO  TRUSOPT 2 % ophthalmic solution Place 2 drops into the right eye 2 (two) times daily.  08/20/14  Yes [provider]  cetirizine (ZYRTEC) 5 MG tablet Take 1 tablet (5 mg total) by mouth daily. Patient not taking: Reported on 08/03/2017 07/24/17   Gildardo Cranker, DO  sodium chloride (OCEAN) 0.65 % SOLN nasal spray Place 1 spray into both nostrils as needed for congestion. Patient not taking: Reported on 08/07/2017 07/28/13   Blanchie Serve, MD    Physical Exam: Vitals:   07/27/2017 1700 07/18/2017 1730 07/22/2017 1815 08/10/2017 2111  BP: (!) 93/40 (!) 80/65 (!) 56/33 (!) 68/48  Pulse:    (!) 147  Resp: (!) 0 13 16 12   Temp:      TempSrc:      SpO2:    (!) 65%   General: Not in acute distress.  Dry mucosa membrane HEENT:       Eyes: PERRL, EOMI, no scleral icterus.       ENT: No discharge from the ears and nose, no pharynx injection, no tonsillar enlargement.        Neck: No JVD, no bruit, no mass felt. Heme: No neck lymph node enlargement. Cardiac: S1/S2, RRR, No murmurs, No gallops or rubs. Respiratory: No rales, wheezing, rhonchi or rubs. GI: Soft, nondistended, no organomegaly, BS present. GU: No hematuria Ext: No pitting leg edema bilaterally. 2+DP/PT pulse bilaterally. Musculoskeletal: No joint deformities, No joint redness or warmth, no limitation of ROM in spin. Skin: stage II sacral ulcer Neuro: unresponsive, not move all extremities, not response to painful stimuli. Psych: Patient is not psychotic, no suicidal or hemocidal ideation.  Labs on Admission: I have personally reviewed following labs and imaging studies  CBC: Recent Labs  Lab 08/12/2017 1604  WBC 10.2  NEUTROABS 8.4*  HGB 13.5  HCT 42.8  MCV 97.5  PLT 90*   Basic Metabolic Panel: No results for input(s): NA, K, CL, CO2, GLUCOSE, BUN, CREATININE, CALCIUM, MG, PHOS in the last 168 hours. GFR: CrCl cannot  be calculated (Patient's most recent lab result is older than the maximum 21 days allowed.). Liver Function Tests: No results for input(s): AST, ALT, ALKPHOS, BILITOT, PROT, ALBUMIN in the last 168 hours. No results for input(s): LIPASE, AMYLASE in the last 168 hours. No results for input(s): AMMONIA in the last 168 hours. Coagulation Profile: No results for input(s): INR, PROTIME in the last 168 hours. Cardiac Enzymes: No results for input(s): CKTOTAL, CKMB, CKMBINDEX, TROPONINI in the last 168 hours. BNP (last 3 results) No results for input(s): PROBNP in the last 8760 hours. HbA1C: No results for input(s): HGBA1C in the last 72 hours. CBG: No results for input(s): GLUCAP in the last 168 hours. Lipid Profile:  No results for input(s): CHOL, HDL, LDLCALC, TRIG, CHOLHDL, LDLDIRECT in the last 72 hours. Thyroid Function Tests: No results for input(s): TSH, T4TOTAL, FREET4, T3FREE, THYROIDAB in the last 72 hours. Anemia Panel: No results for input(s): VITAMINB12, FOLATE, FERRITIN, TIBC, IRON, RETICCTPCT in the last 72 hours. Urine analysis:    Component Value Date/Time   COLORURINE RED (A) 04/29/2017 1315   APPEARANCEUR CLOUDY (A) 04/29/2017 1315   LABSPEC 1.015 04/29/2017 1315   PHURINE 8.0 04/29/2017 1315   GLUCOSEU NEGATIVE 04/29/2017 1315   HGBUR SMALL (A) 04/29/2017 1315   BILIRUBINUR NEGATIVE 04/29/2017 1315   KETONESUR 5 (A) 04/29/2017 1315   PROTEINUR 100 (A) 04/29/2017 1315   UROBILINOGEN 1.0 10/31/2010 1241   NITRITE NEGATIVE 04/29/2017 1315   LEUKOCYTESUR NEGATIVE 04/29/2017 1315   Sepsis Labs: @LABRCNTIP (procalcitonin:4,lacticidven:4) )No results found for this or any previous visit (from the past 240 hour(s)).   Radiological Exams on Admission: Dg Chest Portable 1 View  Result Date: 07/18/2017 CLINICAL DATA:  Dementia. Not eating or drinking over the last week. EXAM: PORTABLE CHEST 1 VIEW COMPARISON:  05/30/2016 FINDINGS: Chronic cardiomegaly and aortic  atherosclerosis. Previous pulmonary resection in the right upper lung. Abnormal density in the left lower lobe consistent with left lower lobe pneumonia. Small amount of left pleural fluid. IMPRESSION: Left lower lobe pneumonia.  Small left effusion. Cardiomegaly and aortic atherosclerosis. Electronically Signed   By: Nelson Chimes M.D.   On: 08/03/2017 17:25     EKG: Independently reviewed. New onset atrial fibrillation, QTC 600, low voltage, anteroseptal infarction pattern, poor R wave progression,  Assessment/Plan Principal Problem:   Comfort measures only status Active Problems:   Hyperlipidemia   Mixed Alzheimer's and vascular dementia   Essential hypertension   Sacral decubitus ulcer, stage II   CKD (chronic kidney disease), stage III (HCC)   Chronic diastolic heart failure, NYHA class 1 (HCC)   Protein-calorie malnutrition, severe   Thrombocytopenia (HCC)   New onset atrial fibrillation (HCC)   Shock (HCC)   Dehydration   COPD (chronic obstructive pulmonary disease) (HCC)   Lobar pneumonia (HCC)   Acute metabolic encephalopathy   Atrial fibrillation with RVR (HCC)   Acute respiratory failure with hypoxia (HCC)   Comfort measures only status: Patient has multiple chronic comorbidities, including dementia, lung cancer, dCHF, hypertension, COPD and CKD-3. Now presenting with worsening mental status.  Found to have new onset atrial fibrillation with RVR, acute respiratory failure, possible lobar pneumonia versus aspiration pneumonia, shock and hypotension.  Lactic acid is elevated at the 4.27.  Patient is hypoxic, and responsive. Her prognosis is extremely poor. I had extensive discussion with the family, including her brother who is the power of attorney and her sister. Patient's family members were very supportive. They agree for comfort care now. Pt will be DNR.  -Will admit to regular bed for comfort care. -Stop drawing labs and IVF -LORazepam 2 MG prn q4h SL for  agitation -morphine prn -Naphazoline 0.1 % ophthalmic solution:  Prn q6h for eye irritation -scopolamine 1.5 MG patch (1.5 mg total) onto the skin every 3 (three)  -Psycho/Social: emotional support offered to patient and family at bedside -consult to palliterative care team in AM  Other Active Problems: pt is in comfort care only, no further treatment.   Hyperlipidemia   Mixed Alzheimer's and vascular dementia   Essential hypertension   Sacral decubitus ulcer, stage II   CKD (chronic kidney disease), stage III (HCC)   Chronic diastolic heart failure, NYHA class 1 (  HCC)   Protein-calorie malnutrition, severe   Thrombocytopenia ([place])   New onset atrial fibrillation (HCC)   Shock ([place])   Dehydration   COPD (chronic obstructive pulmonary disease) (HCC)   Lobar pneumonia (HCC)   Acute metabolic encephalopathy   Atrial fibrillation with RVR (HCC)   DVT ppx: None Code Status: DNR (I discussed with patient's brother who is the power of attorney and her sister son and explained the meaning of CODE STATUS. Per her POA, patient would want to be DNR)  Family Communication: Yes, patient's sister and brother  at bed side Disposition Plan:  To be determined  Consults called:  none Admission status: medical floor/inpt  Date of Service 08/15/2017    Ivor Costa [facility] Pager [phone]  If 7PM-7AM, please contact night-coverage [email or web address] Password [facility] 2017-08-15, 1:57 AM

## 2017-08-08 NOTE — ED Triage Notes (Signed)
Pt brought in by family for not eating or drinking for week. Pt has dementia, hx strokes.

## 2017-08-08 NOTE — ED Provider Notes (Signed)
[facility] DEPT Provider Note   CSN: [id number] Arrival date & time: 07/23/2017  1453     History   Chief Complaint Chief Complaint  Patient presents with  . not eating    HPI Stacey Fernandez is a 84 y.o. female.  HPI Patient brought in by family member and caregiver.  Patient is unresponsive.  At baseline will communicate some but still has dementia.  Reportedly for the last week has been doing worse.  Reportedly has not eaten for the last 3 days had not spoken or really move much in the last couple days.  Patient is very ill appearing.  Patient is a DNR but discussed with family they said that the DNR has been changed that they want CPR done "once". Past Medical History:  Diagnosis Date  . Anxiety state, unspecified   . Arthritis    "hands, feet" (09/13/2015)  . Chronic airway obstruction, not elsewhere classified   . Chronic kidney disease (CKD), stage II (mild)    [name] 09/13/2015  . COPD (chronic obstructive pulmonary disease) ([place])   . Dementia   . Functional constipation    [name] 09/13/2015  . Hemiplegia affecting dominant side, late effect of cerebrovascular disease   . Hyperlipidemia   . Lung cancer ([place])   . Lung mass   . Memory loss   . Mixed Alzheimer's and vascular dementia    /notes 09/13/2015  . Other malaise and fatigue   . Protein calorie malnutrition ([place])    [name] 09/13/2015  . Sacral decubitus ulcer    chronic; stage IV/notes 09/13/2015  . Senile dementia with delirium   . Urinary frequency     Patient Active Problem List   Diagnosis Date Noted  . UTI (urinary tract infection) 04/29/2017  . Hypokalemia 04/29/2017  . Failure to thrive in adult 06/06/2016  . Anemia 06/06/2016  . Thrombocytopenia ([place]) 06/06/2016  . Palliative care by specialist   . Acute cystitis   . Goals of care, counseling/discussion   . Hypernatremia   . Palliative care encounter   . Protein-calorie malnutrition, severe 09/14/2015  . CKD  (chronic kidney disease), stage II 09/13/2015  . AKI (acute kidney injury) ([facility]) 09/13/2015  . Dehydration with hypernatremia 09/13/2015  . Metabolic encephalopathy 27/06/5007  . Chronic diastolic heart failure, NYHA class 1 ([place]) 09/13/2015  . Diarrhea 09/13/2015  . Altered mental status   . Sacral decubitus ulcer, stage II 09/06/2015  . Mixed Alzheimer's and vascular dementia 12/01/2013  . Essential hypertension 12/01/2013  . Nail hypertrophy 12/01/2013  . Protein-calorie malnutrition, moderate ([place]) 09/08/2013  . Functional constipation 09/08/2013  . Edema 07/28/2013  . Allergic rhinitis 07/28/2013  . Alzheimer's dementia without behavioral disturbance 07/28/2013  . Dyspnea on exertion 02/04/2013  . Impaired renal function 09/23/2012  . Urinary frequency 07/29/2012  . Malignant neoplasm of bronchus and lung, unspecified site 07/29/2012  . Anxiety state, unspecified 07/29/2012  . Primary pulmonary hypertension ([place]) 07/29/2012  . Chronic airway obstruction, not elsewhere classified 07/29/2012  . Other malaise and fatigue 07/29/2012  . Hyperlipidemia     Past Surgical History:  Procedure Laterality Date  . CATARACT EXTRACTION, BILATERAL    . mediastinal lymp node dissection  01/10/2011   VAN TRIGT  . WEDGE RESECTION RUL NODULE  01/10/2011   VAN TRIGT     OB History   None      Home Medications    Prior to Admission medications   Medication Sig Start Date End Date Taking?  Authorizing Provider  alendronate (FOSAMAX) 70 MG tablet TAKE 1 TABLET WEEKLY WITH A FULL GLASS OF WATER ON AN EMPTY STOMACH 05/30/17  Yes Gildardo Cranker, DO  aspirin EC 81 MG tablet Take 81 mg by mouth at bedtime.   Yes [provider]  atorvastatin (LIPITOR) 10 MG tablet TAKE 1 TABLET BY MOUTH EVERY DAY 05/30/17  Yes Gildardo Cranker, DO  Calcium Carbonate-Vitamin D (CALTRATE 600+D) 600-400 MG-UNIT tablet Take 1 tablet by mouth 2 (two) times daily.   Yes [provider]  latanoprost  (XALATAN) 0.005 % ophthalmic solution PLACE 1 DROP INTO BOTH EYES AT BEDTIME. 02/15/14  Yes Reed, Tiffany L, DO  memantine (NAMENDA XR) 28 MG CP24 24 hr capsule Take 1 capsule (28 mg total) by mouth daily. 01/08/17  Yes Eulas Post, Monica, DO  mirtazapine (REMERON) 15 MG tablet TAKE 1 TABLET (15 MG TOTAL) BY MOUTH AT BEDTIME. 02/21/17  Yes Gildardo Cranker, DO  TRUSOPT 2 % ophthalmic solution Place 2 drops into the right eye 2 (two) times daily.  08/20/14  Yes [provider]  cetirizine (ZYRTEC) 5 MG tablet Take 1 tablet (5 mg total) by mouth daily. Patient not taking: Reported on 07/16/2017 07/24/17   Gildardo Cranker, DO  sodium chloride (OCEAN) 0.65 % SOLN nasal spray Place 1 spray into both nostrils as needed for congestion. Patient not taking: Reported on 08/01/2017 07/28/13   Blanchie Serve, MD    Family History Family History  Problem Relation Age of Onset  . Heart disease Mother   . Cancer Father   . Heart disease Sister   . Heart disease Brother     Social History Social History   Tobacco Use  . Smoking status: Former Smoker    Packs/day: 0.50    Years: 20.00    Pack years: 10.00    Types: Cigarettes    Last attempt to quit: 06/09/2010    Years since quitting: 7.1  . Smokeless tobacco: Never Used  Substance Use Topics  . Alcohol use: No    Alcohol/week: 0.0 oz  . Drug use: No     Allergies   Patient has no known allergies.   Review of Systems Review of Systems  Unable to perform ROS: Patient nonverbal     Physical Exam Updated Vital Signs BP (!) 80/65   Pulse (!) 108   Temp (!) 94.3 F (34.6 C) (Rectal)   Resp 13   SpO2 (!) 82%   Physical Exam  HENT:  Head: Atraumatic.  Tongue is dry and cracked but does have a gag reflex.  Eyes:  Eyes deviated to right.  Neck: Neck supple.  Cardiovascular:  Tachycardia  Pulmonary/Chest:  Mildly harsh breath sounds throughout.  Abdominal: She exhibits no distension.  Musculoskeletal: She exhibits no tenderness.    Edema bilateral lower extremities  Neurological:  Right arm held contracted.  Minimal response to pain.Eyes deviated to right.  Does have gag reflex.  Breathing spontaneously.  Skin: She is not diaphoretic. No pallor.     ED Treatments / Results  Labs (all labs ordered are listed, but only abnormal results are displayed) Labs Reviewed  CBC WITH DIFFERENTIAL/PLATELET - Abnormal; Notable for the following components:      Result Value   RDW 19.1 (*)    Platelets 90 (*)    nRBC 2 (*)    Neutro Abs 8.4 (*)    All other components within normal limits  BLOOD GAS, ARTERIAL - Abnormal; Notable for the following components:  pH, Arterial 7.520 (*)    pCO2 arterial 20.5 (*)    pO2, Arterial 363 (*)    Bicarbonate 16.7 (*)    Acid-base deficit 4.2 (*)    All other components within normal limits  I-STAT CG4 LACTIC ACID, ED - Abnormal; Notable for the following components:   Lactic Acid, Venous 4.27 (*)    All other components within normal limits  I-STAT CG4 LACTIC ACID, ED - Abnormal; Notable for the following components:   Lactic Acid, Venous 0.39 (*)    All other components within normal limits  URINALYSIS, ROUTINE W REFLEX MICROSCOPIC  COMPREHENSIVE METABOLIC PANEL  TROPONIN I  I-STAT CHEM 8, ED    EKG EKG Interpretation  Date/Time:  Thursday August 08 2017 16:03:32 EDT Ventricular Rate:  125 PR Interval:    QRS Duration: 93 QT Interval:  416 QTC Calculation: 600 R Axis:   -60 Text Interpretation:  Atrial fibrillation with rvr , new Anterior infarct, old Borderline repolarization abnormality Prolonged QT interval Confirmed by Davonna Belling [phone]) on 07/25/2017 4:12:35 PM   Radiology Dg Chest Portable 1 View  Result Date: 08/04/2017 CLINICAL DATA:  Dementia. Not eating or drinking over the last week. EXAM: PORTABLE CHEST 1 VIEW COMPARISON:  05/30/2016 FINDINGS: Chronic cardiomegaly and aortic atherosclerosis. Previous pulmonary resection in the right upper lung.  Abnormal density in the left lower lobe consistent with left lower lobe pneumonia. Small amount of left pleural fluid. IMPRESSION: Left lower lobe pneumonia.  Small left effusion. Cardiomegaly and aortic atherosclerosis. Electronically Signed   By: Nelson Chimes M.D.   On: 08/10/2017 17:25    Procedures Procedures (including critical care time)  Medications Ordered in ED Medications  sodium chloride 0.9 % bolus 1,000 mL (1,000 mLs Intravenous New Bag/Given 08/06/2017 1609)     Initial Impression / Assessment and Plan / ED Course  I have reviewed the triage vital signs and the nursing notes.  Pertinent labs & imaging results that were available during my care of the patient were reviewed by me and considered in my medical decision making (see chart for details).     Patient brought in for altered mental status.  Unresponsive.  History of baseline dementia and is DNR.  Lactic acid initially elevated but unsure about accuracy of it due to the blood collection technique.  X-ray eventually done and showed pneumonia.  Very difficult to get IV access on her.  Discussed with the patient's sister patient would not want.  Extraordinary measures.  Discussed with palliative care, who see the patient tomorrow.  Initial sats were 80% but unsure if these are accurate since ABG was higher than that.  Dr. Porfirio Mylar will admit the patient.  Comfort care.  He will talk with the patient's  brother.  Code sepsis protocol not done due to the palliative care nature of the patient. EKG also showed new onset atrial fibrillation with RVR.  CRITICAL CARE Performed by: Davonna Belling Total critical care time: 30 minutes Critical care time was exclusive of separately billable procedures and treating other patients. Critical care was necessary to treat or prevent imminent or life-threatening deterioration. Critical care was time spent personally by me on the following activities: development of treatment plan with patient  and/or surrogate as well as nursing, discussions with consultants, evaluation of patient's response to treatment, examination of patient, obtaining history from patient or surrogate, ordering and performing treatments and interventions, ordering and review of laboratory studies, ordering and review of radiographic studies, pulse oximetry and re-evaluation  of patient's condition.   Final Clinical Impressions(s) / ED Diagnoses   Final diagnoses:  Community acquired pneumonia, unspecified laterality  Encephalopathy  Dehydration  Palliative care encounter    ED Discharge Orders    None       Davonna Belling, MD 08/11/2017 [phone]

## 2017-08-08 NOTE — ED Notes (Signed)
Per Dr Donna Bernard patient will be comfort measures only. Taken off monitor, 02 remains in place NRB, pt non responsive, family at bedside.  Respirations have long apenic periods.  Pt does not appear to be in any discomfort at this time.

## 2017-08-09 DIAGNOSIS — G9340 Encephalopathy, unspecified: Secondary | ICD-10-CM

## 2017-08-09 DIAGNOSIS — I4891 Unspecified atrial fibrillation: Secondary | ICD-10-CM | POA: Diagnosis present

## 2017-08-09 DIAGNOSIS — J9601 Acute respiratory failure with hypoxia: Secondary | ICD-10-CM | POA: Diagnosis present

## 2017-08-09 DIAGNOSIS — J9690 Respiratory failure, unspecified, unspecified whether with hypoxia or hypercapnia: Secondary | ICD-10-CM | POA: Diagnosis present

## 2017-08-09 MED ORDER — LIP MEDEX EX OINT
TOPICAL_OINTMENT | CUTANEOUS | Status: AC
  Administered 2017-08-09: 14:00:00
  Filled 2017-08-09: qty 7

## 2017-08-09 MED ORDER — ACETAMINOPHEN 650 MG RE SUPP: 650.0000 mg | Freq: Four times a day (QID) | RECTAL | Status: DC | PRN

## 2017-08-09 MED ORDER — ACETAMINOPHEN 325 MG PO TABS: 650.0000 mg | ORAL_TABLET | Freq: Four times a day (QID) | ORAL | Status: DC | PRN

## 2017-08-09 MED ORDER — ONDANSETRON HCL 4 MG PO TABS: 4.0000 mg | ORAL_TABLET | Freq: Four times a day (QID) | ORAL | Status: DC | PRN

## 2017-08-09 MED ORDER — LORAZEPAM 2 MG/ML IJ SOLN: 1.0000 mg | INTRAMUSCULAR | Status: DC | PRN

## 2017-08-09 MED ORDER — SODIUM CHLORIDE 0.9% FLUSH: 3.0000 mL | INTRAVENOUS | Status: DC | PRN

## 2017-08-09 MED ORDER — MORPHINE 100MG IN NS 100ML (1MG/ML) PREMIX INFUSION
1.0000 mg/h | INTRAVENOUS | Status: DC
Start: 2017-08-09 — End: 2017-08-09
  Administered 2017-08-09: 1 mg/h via INTRAVENOUS
  Filled 2017-08-09: qty 100

## 2017-08-09 MED ORDER — ONDANSETRON HCL 4 MG/2ML IJ SOLN: 4.0000 mg | Freq: Four times a day (QID) | INTRAMUSCULAR | Status: DC | PRN

## 2017-08-09 MED ORDER — SODIUM CHLORIDE 0.9 % IV SOLN: 250.0000 mL | INTRAVENOUS | Status: DC | PRN

## 2017-08-09 MED ORDER — SODIUM CHLORIDE 0.9% FLUSH: 3.0000 mL | Freq: Two times a day (BID) | INTRAVENOUS | Status: DC

## 2017-08-14 NOTE — Death Summary Note (Signed)
Death Summary  Stacey Fernandez:[id number] DOB: March 05, 1930 DOA: 08/16/17  PCP: Gildardo Cranker, DO  Admit date: 16-Aug-2017 Date of Death: 17-Aug-2017 Time of Death:  Notification: Gildardo Cranker, DO notified of death of 08/17/17   History of present illness:  Stacey Fernandez is a 84 y.o. female with a history of advanced dementia, large chronic decubitus ulcer, COPD, chronic kidney disease stage III, history of lung cancer.  Stacey Fernandez presented with complaint of altered mentation, dyspnea and decreased oral intake.  For the last 7 days prior to  hospitalization, she became more confused, and less responsive.  She was noted to have respiratory distress which was worsening.  She was not able to communicate due to her severe illness, most information obtained from her family at bedside.  On the initial physical examination blood pressure 80/65, heart rate 147, respirate rate 12, oxygen saturation 65%.  Dry mucous membranes, heart S1-S2 present, tachycardic, lungs with decreased breath sounds bilaterally, poor inspiratory effort, abdomen soft nontender, no lower extremity edema, stage II sacral decubitus ulcer, neurologically patient was nonresponsive.  Her chest x-ray showed a retrocardiac infiltrate at the left lower lobe.   Recent was admitted to the hospital with a working diagnosis of septic shock due to community-acquired pneumonia, complicated by acute hypoxic respiratory failure.   Stacey Fernandez did not improve after conservative medical therapy   Due to the severity of her illness, and significant comorbidities patient's family decided to allow patient natural death.  Patient was placed on comfort measures, supplemental oxygen per nasal cannula, IV morphine to control her dyspnea and discomfort.  Final Diagnoses:  1.   Septic shock due to left lower lobe pneumonia/community-acquired, present on admission. 2.  Acute hypoxic respiratory failure 3.  Metabolic  encephalopathy 4.  Protein calorie malnutrition, severe 5.  New onset atrial fibrillation   The results of significant diagnostics from this hospitalization (including imaging, microbiology, ancillary and laboratory) are listed below for reference.    Significant Diagnostic Studies: Dg Chest Portable 1 View  Result Date: 08/16/2017 CLINICAL DATA:  Dementia. Not eating or drinking over the last week. EXAM: PORTABLE CHEST 1 VIEW COMPARISON:  05/30/2016 FINDINGS: Chronic cardiomegaly and aortic atherosclerosis. Previous pulmonary resection in the right upper lung. Abnormal density in the left lower lobe consistent with left lower lobe pneumonia. Small amount of left pleural fluid. IMPRESSION: Left lower lobe pneumonia.  Small left effusion. Cardiomegaly and aortic atherosclerosis. Electronically Signed   By: Nelson Chimes M.D.   On: 16-Aug-2017 17:25    Microbiology: No results found for this or any previous visit (from the past 240 hour(s)).   Labs: Basic Metabolic Panel: No results for input(s): NA, K, CL, CO2, GLUCOSE, BUN, CREATININE, CALCIUM, MG, PHOS in the last 168 hours. Liver Function Tests: No results for input(s): AST, ALT, ALKPHOS, BILITOT, PROT, ALBUMIN in the last 168 hours. No results for input(s): LIPASE, AMYLASE in the last 168 hours. No results for input(s): AMMONIA in the last 168 hours. CBC: Recent Labs  Lab 16-Aug-2017 1604  WBC 10.2  NEUTROABS 8.4*  HGB 13.5  HCT 42.8  MCV 97.5  PLT 90*   Cardiac Enzymes: No results for input(s): CKTOTAL, CKMB, CKMBINDEX, TROPONINI in the last 168 hours. D-Dimer No results for input(s): DDIMER in the last 72 hours. BNP: Invalid input(s): POCBNP CBG: No results for input(s): GLUCAP in the last 168 hours. Anemia work up No results for input(s): VITAMINB12, FOLATE, FERRITIN, TIBC, IRON, RETICCTPCT in the last 83  hours. Urinalysis    Component Value Date/Time   COLORURINE RED (A) 04/29/2017 1315   APPEARANCEUR CLOUDY (A)  04/29/2017 1315   LABSPEC 1.015 04/29/2017 1315   PHURINE 8.0 04/29/2017 1315   GLUCOSEU NEGATIVE 04/29/2017 1315   HGBUR SMALL (A) 04/29/2017 1315   BILIRUBINUR NEGATIVE 04/29/2017 1315   KETONESUR 5 (A) 04/29/2017 1315   PROTEINUR 100 (A) 04/29/2017 1315   UROBILINOGEN 1.0 10/31/2010 1241   NITRITE NEGATIVE 04/29/2017 1315   LEUKOCYTESUR NEGATIVE 04/29/2017 1315   Sepsis Labs Invalid input(s): PROCALCITONIN,  WBC,  LACTICIDVEN     SIGNED:  Tawni Millers, MD  [facility] 08/26/17, 3:33 PM Pager   If 7PM-7AM, please contact night-coverage [email or web address] Password [email or web address]

## 2017-08-14 NOTE — Progress Notes (Signed)
PROGRESS NOTE    Stacey Fernandez  WEX:[id number] DOB: 1930-02-12 DOA: 08/07/2017 PCP: Gildardo Cranker, DO    Brief Narrative: Ms. Stacey Fernandez is an 84 year old female who presents to the ER with AMS, shortness of breath and decreased oral intake. Past medical history significant for hypertension, hyperlipidemia, stroke, COPD, CKD stage III, dementia, lung cancer (s/p surgery, no chemo/radiation) and diastolic CHF. Patient does not communicate at baseline and history is provided by her brother who states that in the past 7 days she has become more confused and less responsive with significantly decreased oral intake. He also notes she seems to be in respiratory distress and shortness of breath. No nausea, vomiting, diarrhea noted. Not sure if she is experiencing symptoms of UTI or in pain. Minimal movement in her extremities. On admission, temperature 94.3, tachycardic, respiratory rate 1-8, oxygen 82% on room air, hypotensive with SBP of 60-70's.WBC 10.3, lactic acid 4.27>>0.39, BMP pending. EKG shows new atrial fibrillation with RVR. CXR shows left lower lobe pneumonia with small left effusion. Family has agreed to comfort care for now and patient is DNR.   Patient was admitted as inpatient for comfort care only.   Assessment & Plan:   Principal Problem:   Comfort measures only status Active Problems:   Hyperlipidemia   Mixed Alzheimer's and vascular dementia   Essential hypertension   Sacral decubitus ulcer, stage II   CKD (chronic kidney disease), stage III (HCC)   Chronic diastolic heart failure, NYHA class 1 (HCC)   Protein-calorie malnutrition, severe   Thrombocytopenia (HCC)   New onset atrial fibrillation (HCC)   Shock (HCC)   Dehydration   COPD (chronic obstructive pulmonary disease) (HCC)   Lobar pneumonia (HCC)   Acute metabolic encephalopathy   Atrial fibrillation with RVR (HCC)   Acute respiratory failure with hypoxia (HCC)   Comfort measures only  status -Brother is power of attorney and states patient would like to be DNR.  -Start patient on 100mg  continuous morphine infusion. -Discontinue nonrebreathing mask and transition to 1L O2 nasal cannula. -Continue naphazoline PRN. -Continue Scopolamine patch. -Palliative care team consult.  Other active problems: Patient is comfort care only, no further treatment. Hyperlipidemia   Mixed Alzheimer's and vascular dementia   Essential hypertension   Sacral decubitus ulcer, stage II   CKD (chronic kidney disease), stage III (HCC)   Chronic diastolic heart failure, NYHA class 1 (HCC)   Protein-calorie malnutrition, severe   Thrombocytopenia (HCC)   New onset atrial fibrillation (HCC)   Shock (HCC)   Dehydration   COPD (chronic obstructive pulmonary disease) (HCC)   Lobar pneumonia (HCC)   Acute metabolic encephalopathy   Atrial fibrillation with RVR (HCC)   Acute respiratory failure with hypoxia (HCC)   DVT prophylaxis: None, comfort care. Code Status: DNR.  Family Communication: Sister at bedside. Disposition Plan: TBD.   Consultants:   Palliative care.  Procedures:   None  Antimicrobials:  None.   Subjective: Patient is unresponsive to touch or sound, noncommunicating. Occasionally opens eyes to some stimulation. Displaying agonal breathing.  Objective: Vitals:   07/23/2017 1730 08/06/2017 1815 07/22/2017 2111 Sep 04, 2017 0530  BP: (!) 80/65 (!) 56/33 (!) 68/48 (!) 69/54  Pulse:   (!) 147 86  Resp: 13 16 12 12   Temp:      TempSrc:      SpO2:   (!) 65% (!) 78%    Intake/Output Summary (Last 24 hours) at Sep 04, 2017 1041 Last data filed at 04-Sep-2017 0548 Gross per 24  hour  Intake 1000 ml  Output -  Net 1000 ml   There were no vitals filed for this visit.  Examination:  General exam: Sleeping, not responsive to touch or sound. Respiratory system:  Agonal breathing. Nonrebreathing mask in place, Crackles bilaterally. Cardiovascular system: S1 & S2 heard, RRR.  No murmurs, rubs, gallops or clicks. No pedal edema. Gastrointestinal system: Abdomen is nondistended, soft and non-tender. Hypoactive bowel sounds heard. Central nervous system: Unresponsive, no response to sound or touch. Extremities: Does not move extremities. Psychiatry: Unable to assess.   Data Reviewed: I have personally reviewed following labs and imaging studies  CBC: Recent Labs  Lab 07/19/2017 1604  WBC 10.2  NEUTROABS 8.4*  HGB 13.5  HCT 42.8  MCV 97.5  PLT 90*   Basic Metabolic Panel: No results for input(s): NA, K, CL, CO2, GLUCOSE, BUN, CREATININE, CALCIUM, MG, PHOS in the last 168 hours. GFR: CrCl cannot be calculated (Patient's most recent lab result is older than the maximum 21 days allowed.). Liver Function Tests: No results for input(s): AST, ALT, ALKPHOS, BILITOT, PROT, ALBUMIN in the last 168 hours. No results for input(s): LIPASE, AMYLASE in the last 168 hours. No results for input(s): AMMONIA in the last 168 hours. Coagulation Profile: No results for input(s): INR, PROTIME in the last 168 hours. Cardiac Enzymes: No results for input(s): CKTOTAL, CKMB, CKMBINDEX, TROPONINI in the last 168 hours. BNP (last 3 results) No results for input(s): PROBNP in the last 8760 hours. HbA1C: No results for input(s): HGBA1C in the last 72 hours. CBG: No results for input(s): GLUCAP in the last 168 hours. Lipid Profile: No results for input(s): CHOL, HDL, LDLCALC, TRIG, CHOLHDL, LDLDIRECT in the last 72 hours. Thyroid Function Tests: No results for input(s): TSH, T4TOTAL, FREET4, T3FREE, THYROIDAB in the last 72 hours. Anemia Panel: No results for input(s): VITAMINB12, FOLATE, FERRITIN, TIBC, IRON, RETICCTPCT in the last 72 hours. Sepsis Labs: Recent Labs  Lab 08/02/2017 1629 07/24/2017 1757  LATICACIDVEN 4.27* 0.39*    No results found for this or any previous visit (from the past 240 hour(s)).       Radiology Studies: Dg Chest Portable 1 View  Result  Date: 08/13/2017 CLINICAL DATA:  Dementia. Not eating or drinking over the last week. EXAM: PORTABLE CHEST 1 VIEW COMPARISON:  05/30/2016 FINDINGS: Chronic cardiomegaly and aortic atherosclerosis. Previous pulmonary resection in the right upper lung. Abnormal density in the left lower lobe consistent with left lower lobe pneumonia. Small amount of left pleural fluid. IMPRESSION: Left lower lobe pneumonia.  Small left effusion. Cardiomegaly and aortic atherosclerosis. Electronically Signed   By: Nelson Chimes M.D.   On: 08/02/2017 17:25        Scheduled Meds: . scopolamine  1 patch Transdermal Q72H   Continuous Infusions:   LOS: 1 day    Time spent: 25 minutes.   Eloy End, PA-S [facility] Pager [phone]  If 7PM-7AM, please contact night-coverage [email or web address] Password [facility] 08-24-2017, 10:41 AM

## 2017-08-14 NOTE — Progress Notes (Signed)
Ms. Stacey Fernandez passed away at 1500 today. 2 RNs pronounced time of death, 1500.

## 2017-08-14 NOTE — Progress Notes (Signed)
80 ccs Morphine solution 1:1 was wasted and witnessed by [employer].

## 2017-08-14 DEATH — deceased

## 2017-09-06 ENCOUNTER — Ambulatory Visit: Payer: Medicare Other | Admitting: Internal Medicine
